# Patient Record
Sex: Male | Born: 1945 | Race: White | Hispanic: No | Marital: Married | Smoking: Never smoker
Health system: Southern US, Community
[De-identification: ages and names within clinical notes are randomized; demographics above are authoritative.]

## PROBLEM LIST (undated history)

## (undated) DIAGNOSIS — Z87442 Personal history of urinary calculi: Secondary | ICD-10-CM

## (undated) DIAGNOSIS — I639 Cerebral infarction, unspecified: Secondary | ICD-10-CM

## (undated) DIAGNOSIS — C4490 Unspecified malignant neoplasm of skin, unspecified: Secondary | ICD-10-CM

## (undated) DIAGNOSIS — M1990 Unspecified osteoarthritis, unspecified site: Secondary | ICD-10-CM

## (undated) DIAGNOSIS — I509 Heart failure, unspecified: Secondary | ICD-10-CM

## (undated) DIAGNOSIS — E785 Hyperlipidemia, unspecified: Secondary | ICD-10-CM

## (undated) DIAGNOSIS — E119 Type 2 diabetes mellitus without complications: Secondary | ICD-10-CM

## (undated) DIAGNOSIS — K219 Gastro-esophageal reflux disease without esophagitis: Secondary | ICD-10-CM

## (undated) DIAGNOSIS — G459 Transient cerebral ischemic attack, unspecified: Secondary | ICD-10-CM

## (undated) DIAGNOSIS — S32009A Unspecified fracture of unspecified lumbar vertebra, initial encounter for closed fracture: Secondary | ICD-10-CM

## (undated) DIAGNOSIS — I10 Essential (primary) hypertension: Secondary | ICD-10-CM

## (undated) DIAGNOSIS — I2510 Atherosclerotic heart disease of native coronary artery without angina pectoris: Secondary | ICD-10-CM

## (undated) DIAGNOSIS — E039 Hypothyroidism, unspecified: Secondary | ICD-10-CM

## (undated) DIAGNOSIS — Z95810 Presence of automatic (implantable) cardiac defibrillator: Secondary | ICD-10-CM

## (undated) DIAGNOSIS — I219 Acute myocardial infarction, unspecified: Secondary | ICD-10-CM

## (undated) DIAGNOSIS — I499 Cardiac arrhythmia, unspecified: Secondary | ICD-10-CM

## (undated) DIAGNOSIS — E079 Disorder of thyroid, unspecified: Secondary | ICD-10-CM

## (undated) HISTORY — DX: Type 2 diabetes mellitus without complications: E11.9

## (undated) HISTORY — DX: Heart failure, unspecified: I50.9

## (undated) HISTORY — DX: Essential (primary) hypertension: I10

## (undated) HISTORY — DX: Unspecified malignant neoplasm of skin, unspecified: C44.90

## (undated) HISTORY — DX: Gastro-esophageal reflux disease without esophagitis: K21.9

## (undated) HISTORY — DX: Hyperlipidemia, unspecified: E78.5

## (undated) HISTORY — DX: Unspecified fracture of unspecified lumbar vertebra, initial encounter for closed fracture: S32.009A

## (undated) HISTORY — DX: Disorder of thyroid, unspecified: E07.9

## (undated) HISTORY — PX: CARDIAC DEFIBRILLATOR PLACEMENT: SHX171

## (undated) HISTORY — DX: Transient cerebral ischemic attack, unspecified: G45.9

## (undated) HISTORY — DX: Acute myocardial infarction, unspecified: I21.9

## (undated) HISTORY — DX: Cerebral infarction, unspecified: I63.9

---

## 1992-05-22 HISTORY — PX: KNEE SURGERY: SHX244

## 1998-07-07 ENCOUNTER — Ambulatory Visit (HOSPITAL_COMMUNITY): Admission: RE | Admit: 1998-07-07 | Discharge: 1998-07-07 | Payer: Self-pay | Admitting: *Deleted

## 1998-10-15 ENCOUNTER — Other Ambulatory Visit: Admission: RE | Admit: 1998-10-15 | Discharge: 1998-10-15 | Payer: Self-pay | Admitting: Otolaryngology

## 1999-10-03 ENCOUNTER — Encounter (INDEPENDENT_AMBULATORY_CARE_PROVIDER_SITE_OTHER): Payer: Self-pay

## 1999-10-03 ENCOUNTER — Ambulatory Visit (HOSPITAL_COMMUNITY): Admission: RE | Admit: 1999-10-03 | Discharge: 1999-10-03 | Payer: Self-pay | Admitting: *Deleted

## 2000-11-09 ENCOUNTER — Encounter (INDEPENDENT_AMBULATORY_CARE_PROVIDER_SITE_OTHER): Payer: Self-pay | Admitting: Specialist

## 2000-11-09 ENCOUNTER — Ambulatory Visit (HOSPITAL_COMMUNITY): Admission: RE | Admit: 2000-11-09 | Discharge: 2000-11-09 | Payer: Self-pay | Admitting: *Deleted

## 2000-11-09 ENCOUNTER — Encounter: Payer: Self-pay | Admitting: *Deleted

## 2002-01-27 ENCOUNTER — Ambulatory Visit (HOSPITAL_COMMUNITY): Admission: RE | Admit: 2002-01-27 | Discharge: 2002-01-27 | Payer: Self-pay | Admitting: *Deleted

## 2002-01-27 ENCOUNTER — Encounter (INDEPENDENT_AMBULATORY_CARE_PROVIDER_SITE_OTHER): Payer: Self-pay

## 2003-08-26 ENCOUNTER — Inpatient Hospital Stay (HOSPITAL_COMMUNITY): Admission: EM | Admit: 2003-08-26 | Discharge: 2003-08-27 | Payer: Self-pay | Admitting: Emergency Medicine

## 2004-03-16 ENCOUNTER — Ambulatory Visit (HOSPITAL_COMMUNITY): Admission: RE | Admit: 2004-03-16 | Discharge: 2004-03-16 | Payer: Self-pay | Admitting: *Deleted

## 2004-03-16 ENCOUNTER — Encounter (INDEPENDENT_AMBULATORY_CARE_PROVIDER_SITE_OTHER): Payer: Self-pay | Admitting: *Deleted

## 2005-10-25 DIAGNOSIS — I639 Cerebral infarction, unspecified: Secondary | ICD-10-CM | POA: Insufficient documentation

## 2006-03-16 ENCOUNTER — Ambulatory Visit (HOSPITAL_COMMUNITY): Admission: RE | Admit: 2006-03-16 | Discharge: 2006-03-16 | Payer: Self-pay | Admitting: *Deleted

## 2006-03-16 ENCOUNTER — Encounter (INDEPENDENT_AMBULATORY_CARE_PROVIDER_SITE_OTHER): Payer: Self-pay | Admitting: Specialist

## 2007-05-23 DIAGNOSIS — G459 Transient cerebral ischemic attack, unspecified: Secondary | ICD-10-CM

## 2007-05-23 HISTORY — DX: Transient cerebral ischemic attack, unspecified: G45.9

## 2008-07-16 ENCOUNTER — Ambulatory Visit (HOSPITAL_COMMUNITY): Admission: RE | Admit: 2008-07-16 | Discharge: 2008-07-16 | Payer: Self-pay | Admitting: *Deleted

## 2008-07-16 ENCOUNTER — Encounter (INDEPENDENT_AMBULATORY_CARE_PROVIDER_SITE_OTHER): Payer: Self-pay | Admitting: *Deleted

## 2009-05-22 DIAGNOSIS — G458 Other transient cerebral ischemic attacks and related syndromes: Secondary | ICD-10-CM | POA: Insufficient documentation

## 2010-05-22 DIAGNOSIS — S32009A Unspecified fracture of unspecified lumbar vertebra, initial encounter for closed fracture: Secondary | ICD-10-CM

## 2010-05-22 HISTORY — DX: Unspecified fracture of unspecified lumbar vertebra, initial encounter for closed fracture: S32.009A

## 2010-08-31 ENCOUNTER — Encounter: Payer: Self-pay | Admitting: Physician Assistant

## 2010-08-31 DIAGNOSIS — L570 Actinic keratosis: Secondary | ICD-10-CM

## 2010-08-31 DIAGNOSIS — C439 Malignant melanoma of skin, unspecified: Secondary | ICD-10-CM

## 2010-08-31 DIAGNOSIS — M5136 Other intervertebral disc degeneration, lumbar region: Secondary | ICD-10-CM | POA: Insufficient documentation

## 2010-08-31 DIAGNOSIS — I739 Peripheral vascular disease, unspecified: Secondary | ICD-10-CM | POA: Insufficient documentation

## 2010-08-31 DIAGNOSIS — I10 Essential (primary) hypertension: Secondary | ICD-10-CM

## 2010-08-31 DIAGNOSIS — M545 Low back pain, unspecified: Secondary | ICD-10-CM | POA: Insufficient documentation

## 2010-08-31 DIAGNOSIS — I2510 Atherosclerotic heart disease of native coronary artery without angina pectoris: Secondary | ICD-10-CM | POA: Insufficient documentation

## 2010-08-31 DIAGNOSIS — G8929 Other chronic pain: Secondary | ICD-10-CM

## 2010-09-02 ENCOUNTER — Encounter: Payer: Self-pay | Admitting: Physician Assistant

## 2010-09-02 DIAGNOSIS — I639 Cerebral infarction, unspecified: Secondary | ICD-10-CM

## 2010-09-02 DIAGNOSIS — K2270 Barrett's esophagus without dysplasia: Secondary | ICD-10-CM | POA: Insufficient documentation

## 2010-09-02 DIAGNOSIS — I6523 Occlusion and stenosis of bilateral carotid arteries: Secondary | ICD-10-CM | POA: Insufficient documentation

## 2010-09-02 DIAGNOSIS — I2510 Atherosclerotic heart disease of native coronary artery without angina pectoris: Secondary | ICD-10-CM

## 2010-09-02 DIAGNOSIS — K449 Diaphragmatic hernia without obstruction or gangrene: Secondary | ICD-10-CM | POA: Insufficient documentation

## 2010-09-02 NOTE — Assessment & Plan Note (Deleted)
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 PCI to the proximal LAD (2004) B. Ischemic cardiomyopathy  EF 35% C. S/P Metronic AICD implantation

## 2010-10-04 NOTE — Op Note (Signed)
NAME:  Edward Heath, ARAVE NO.:  [id number]   MEDICAL RECORD NO.:  [id number]          PATIENT TYPE:  AMB   LOCATION:  ENDO                         FACILITY:  [facility]   PHYSICIAN:  Georgiana Spinner, M.D.    DATE OF BIRTH:  04-26-1946   DATE OF PROCEDURE:  DATE OF DISCHARGE:                               OPERATIVE REPORT   PROCEDURE:  Colonoscopy.   INDICATIONS:  Colon cancer screening, rectal bleeding.   ANESTHESIA:  None further given.   PROCEDURE:  With the patient mildly sedated in the left lateral  decubitus position the Pentax videoscopic colonoscope was inserted in  the rectum and passed under direct vision rather easily to the cecum  identified by ileocecal valve and appendiceal orifice, both of which  were photographed.  From this point colonoscope was slowly withdrawn  taking circumferential views of colonic mucosa, stopping in the  transverse colon where a polyp was seen, photographed and removed using  biopsy forceps.  It took [place] two bites to remove it.  Subsequently, the  endoscope was withdrawn to the descending colon where a second polyp was  seen.  It too was removed at this time using snare cautery technique  without cautery since the patient has had been ICD.  The endoscope was  withdrawn to the rectum which appeared normal on direct and showed  hemorrhoids on retroflexed view.  The endoscope was straightened and  withdrawn.  The patient's vital signs and pulse oximeter remained  stable.  The patient tolerated procedure well with no apparent  complications.   FINDINGS:  Two small polyps in transverse and descending colon.  Internal hemorrhoids, otherwise unremarkable exam.   PLAN:  Await biopsy report.  The patient will call me for results and  follow-up with me as an outpatient.           ______________________________  Georgiana Spinner, M.D.     GMO/MEDQ  D:  07/16/2008  T:  07/16/2008  Job:  [id number]

## 2010-10-04 NOTE — Op Note (Signed)
NAME:  Edward Heath, Edward Heath NO.:  [id number]   MEDICAL RECORD NO.:  [id number]          PATIENT TYPE:  AMB   LOCATION:  ENDO                         FACILITY:  [facility]   PHYSICIAN:  Georgiana Spinner, M.D.    DATE OF BIRTH:  1945-10-27   DATE OF PROCEDURE:  DATE OF DISCHARGE:                               OPERATIVE REPORT   PROCEDURE:  Upper endoscopy.   INDICATIONS:  GERD.   ANESTHESIA:  Fentanyl 75 mcg, Versed 7.5 mg.   PROCEDURE:  With the patient mildly sedated in the left lateral  decubitus position, the Pentax videoscopic endoscope was inserted in the  mouth and passed under direct vision through the esophagus, which  appeared normal, into the stomach.  The fundus, body, antrum, duodenal  bulb and second portion of the duodenum were visualized.  From this  point the endoscope was slowly withdrawn, taking circumferential views  of the duodenal mucosa until the endoscope had been pulled back into the  stomach, placed in retroflexion to view the stomach from below.  The  endoscope was then straightened after a biopsy of possible Barrett's was  taken seen on retroflexed view only.  The endoscope then was withdrawn,  taking circumferential views of the remaining gastric and esophageal  mucosa.  The patient's vital signs, pulse oximeter remained stable.  The  patient tolerated the procedure well without apparent complications.   FINDINGS:  Question of Barrett esophagus seen on retroflexed view.  Otherwise an unremarkable examination.   PLAN:  Await biopsy report.  The patient will call me for results and  follow up with me as an outpatient.  Proceed to colonoscopy.           ______________________________  Georgiana Spinner, M.D.     GMO/MEDQ  D:  07/16/2008  T:  07/16/2008  Job:  [id number]

## 2010-10-07 NOTE — Procedures (Signed)
[facility]  Patient:    Edward Heath, Edward Heath                      MRN: [id number] Proc. Date: 11/09/00 Adm. Date:  [id number] Attending:  Sabino Gasser CC:         Delorse Lek, M.D.                           Procedure Report  PROCEDURE:  Upper endoscopy with dilation and biopsy.  INDICATIONS:  Solid food dysphagia.  Patient on Nexium.  ANESTHESIA:  Demerol 70 mg, Versed 7 mg.  DESCRIPTION OF PROCEDURE:  With the patient mildly sedated in room 2 of radiology at [facility], the Olympus videoscopic endoscope was inserted into the mouth and passed under direct vision through the esophagus. The distal esophagus was approached and seemed to show areas of Barretts esophagus.  These were photographed.  We entered into the stomach.  The fundus, body, antrum, duodenal bulb, and second portion of the duodenum all appeared normal.  From this point, the endoscope was slowly withdrawn, taking circumferential views of the entire duodenal mucosa until the endoscope then pulled back in the stomach and placed in retroflexion to view the stomach from below.  This was photographed.  The endoscope was then straightened, guidewire was passed; the endoscope was withdrawn taking circumferential views of the remaining gastric and esophageal mucosa.  Subsequently, Savary dilators, size 14, 17, and 19 mm were passed with minimal blood seen and with very minimal resistance.  With the latter, the guidewire was removed.  The endoscope was then reinserted to the level of the distal esophagus where biopsies were taken.  The endoscope was then withdrawn, taking circumferential views of the remaining esophageal mucosa which otherwise appeared normal.  The patients vital signs and pulse oximeter remained stable.  The patient tolerated the procedure well without apparent complications.  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  Biopsies  taken.  Dilation from 14, 17, and 19 Savary dilator.  PLAN:  We will have the patient call me for the results of the biopsy and follow up with me as an outpatient to assess clinical response. DD:  11/09/00 TD:  11/09/00 Job: [id number]

## 2010-10-07 NOTE — Op Note (Signed)
   TNAMECORKY, BLUMSTEIN                        ACCOUNT NO.:  [id number]   MEDICAL RECORD NO.:  [id number]                   PATIENT TYPE:  AMB   LOCATION:  ENDO                                 FACILITY:  [facility]   PHYSICIAN:  Georgiana Spinner, M.D.                 DATE OF BIRTH:  [birth year]   DATE OF PROCEDURE:  DATE OF DISCHARGE:                                 OPERATIVE REPORT   PROCEDURE:  Colonoscopy.   INDICATIONS FOR PROCEDURE:  Colon cancer screening, Hemoccult positivity.   ANESTHESIA:  Demerol 10, Versed 1 mg.   DESCRIPTION OF PROCEDURE:  With the patient mildly sedated in the left  lateral decubitus position, a rectal exam was performed which revealed trace  positive material. Subsequently, the Olympus videoscopic colonoscope was  inserted in the rectum and passed under direct vision to the cecum  identified by the ileocecal valve and appendiceal orifice both of which were  photographed. From this point, the colonoscope was slowly withdrawn taking  circumferential views of the entire colonic mucosa stopping only in the  rectum which appeared normal on direct and showed hemorrhoids on retroflexed  view. The endoscope was straightened and withdrawn. The patient's vital  signs and pulse oximeter remained stable. The patient tolerated the  procedure well without apparent complications.   FINDINGS:  Internal hemorrhoids, otherwise, unremarkable colonoscopic  examination.   PLAN:  Repeat examination in approximately 10 years.                                                Georgiana Spinner, M.D.    GMO/MEDQ  D:  01/27/2002  T:  01/27/2002  Job:  [id number]   cc:   Delorse Lek, M.D.

## 2010-10-07 NOTE — Discharge Summary (Signed)
NAME:  Edward Heath, Edward Heath                         ACCOUNT NO.:  [id number]   MEDICAL RECORD NO.:  [id number]                   PATIENT TYPE:  INP   LOCATION:  0353                                 FACILITY:  [facility]   PHYSICIAN:  Vesta Mixer, M.D.              DATE OF BIRTH:  Apr 29, 1946   DATE OF ADMISSION:  08/26/2003  DATE OF DISCHARGE:  08/27/2003                                 DISCHARGE SUMMARY   DISCHARGE DIAGNOSES:  1. Non-cardiac chest pain.  2. History of coronary artery disease with a history of an anterior wall     myocardial infarction approximately five months ago.  3. Hyperlipidemia.  4. Hypertension.   DISCHARGE MEDICATIONS:  1. Cytotec 200 mcg b.i.d.  2. Diclofenac 75 mg daily.  3. Protonix 40 mg daily.  4. Simvastatin 80 mg daily.  5. Hydrochlorothiazide 25 mg daily.  6. Lisinopril 10 mg daily.  7. Metoprolol 25 mg p.o. b.i.d.  8. Terazosin 5 mg daily.   DISPOSITION/INSTRUCTIONS:  1. The patient is to see Dr. Deloris Ping Nahser in one to two weeks.  2. He is to be on a low-fat, low-cholesterol diet.  3. He is to follow up with [facility] as needed.   HISTORY OF PRESENT ILLNESS:  The patient is a middle-aged gentleman who was  seen yesterday for some episodes of atypical chest pain.  He was admitted  for further observation.   HOSPITAL COURSE:  #1 - CHEST PAIN:  The patient ruled out for a myocardial  infarction with serial CPK's.  His electrocardiogram remained unchanged.  He  stated that his chest pain was completely different than his anterior wall  myocardial infarction chest pain that he had several months ago.  The  patient did quite well and did not have any further episodes of pain.  He  will need to be evaluated for further problems, including a gastroesophageal  etiology or musculoskeletal etiology.  He will see Dr. Elease Hashimoto next week and  we will get a stress Cardiolite study.  We will also need to get an  echocardiogram for  further evaluation of his LV function following his large  anterior wall myocardial infarction back in November.  All of his other  medical problems are stable.                                               Vesta Mixer, M.D.   PJN/MEDQ  D:  08/27/2003  T:  08/28/2003  Job:  [id number]   cc:   [facility]

## 2010-10-07 NOTE — Op Note (Signed)
NAME:  Edward Heath, Edward Heath NO.:  [id number]   MEDICAL RECORD NO.:  [id number]          PATIENT TYPE:  AMB   LOCATION:  ENDO                         FACILITY:  [facility]   PHYSICIAN:  Georgiana Spinner, M.D.    DATE OF BIRTH:  July 15, 1945   DATE OF PROCEDURE:  DATE OF DISCHARGE:                                 OPERATIVE REPORT   PROCEDURE:  Upper endoscopy with biopsy.   INDICATIONS:  GERD, with history of Barrett's esophagus.   ANESTHESIA:  1.  Demerol 50.  2.  Versed 4 mg.   PROCEDURE:  With the patient mildly sedated in the left lateral decubitus  position, the Olympus videoscopic endoscope was inserted into the mouth and  passed under direct vision into the esophagus which appeared normal until we  reached the distal esophagus.  Then, there were changes of Barrett's  esophagus which were biopsied and photographed.  The endoscope was advanced  into the stomach, and the fundus appeared to be somewhat cobblestoned, which  was photographed and biopsied.  Body, antrum, duodenal bulb, and second  portion of duodenum appeared normal.  From this point, the endoscope was  slowly withdrawn, taking circumferential views of the remaining gastric and  duodenal mucosa until the endoscope until the endoscope had been pulled back  into the stomach, placed on retroflexion, and viewed the stomach from below.  The endoscope was straightened and withdrawn, taking circumferential views  of the remaining gastric and esophageal mucosa.  The patient's vital signs  and pulse oximetry remained.  The patient tolerated the procedure well,  without apparent complication.   FINDINGS:  1.  Barrett's esophagus, biopsied.  Await biopsy report.  2.  Changes of fundus, biopsied.  Await biopsy report.   The patient will call me for results and follow up with me as an outpatient.      GMO/MEDQ  D:  03/16/2004  T:  03/16/2004  Job:  [id number]

## 2010-10-07 NOTE — Op Note (Signed)
   Edward Heath, [name]                        ACCOUNT NO.:  [id number]   MEDICAL RECORD NO.:  [id number]                   PATIENT TYPE:  AMB   LOCATION:  ENDO                                 FACILITY:  [facility]   PHYSICIAN:  Georgiana Spinner, M.D.                 DATE OF BIRTH:  Mar 31, 1946   DATE OF PROCEDURE:  DATE OF DISCHARGE:                                 OPERATIVE REPORT   PROCEDURE:  Upper endoscopy with biopsy.   INDICATIONS FOR PROCEDURE:  Barrett's esophagus, gastroesophageal reflux  disease.   ANESTHESIA:  Demerol 50, Versed 5 mg.   DESCRIPTION OF PROCEDURE:  With the patient mildly sedated in the left  lateral decubitus position, the Olympus videoscopic endoscope was inserted  in the mouth and passed under direct vision through the esophagus which  appeared normal until we reached the distal esophagus which showed some  irregularity of the Z line which was photographed and then biopsied. We  entered into the stomach. The fundus and body appeared normal. The antrum  showed some changes possibly of gastritis which were photographed biopsied.  We entered into the duodenal bulb and second portion of the duodenum and  both appeared normal. From this point, the endoscope was slowly withdrawn  taking circumferential views of the entire duodenal mucosa until the  endoscope was then pulled back in the stomach, placed in retroflexion to  view the stomach from below and an incomplete wrap of the GE junction around  the endoscope was noted and photographed. The endoscope was then  straightened and withdrawn taking circumferential views of the remaining  gastric and esophageal mucosa. The patient's vital signs and pulse oximeter  remained stable. The patient tolerated the procedure well without apparent  complications.   FINDINGS:  Irregularity at the Z line biopsied, incomplete wrap of the GE  junction around the endoscope and some possible antritis biopsied. Await  biopsy  report. The patient will call me for the results and followup with me  as an outpatient. Proceed to colonoscopy as planned.                                                Georgiana Spinner, M.D.    GMO/MEDQ  D:  01/27/2002  T:  01/27/2002  Job:  [id number]   cc:   Delorse Lek, M.D.

## 2010-10-07 NOTE — Procedures (Signed)
[facility]  Patient:    Edward Heath, Edward Heath                      MRN: [id number] Adm. Date:  [id number] Attending:  Sabino Gasser                           Procedure Report  PROCEDURE:  Follow-up of Barretts esophagus.  ANESTHESIA:  Demerol 50 mg, Versed 3 mg was given intravenously in divided dose.  DESCRIPTION OF PROCEDURE:  With the patient mildly sedately in the left lateral decubitus position, the Olympus videoscopic endoscope was inserted in the mouth and passed under direct vision through the esophagus and there were changes of possible Barretts esophagus photographed and then biopsied. We entered into the stomach. As we approached the antrum, it appeared to be somewhat erythematous and biopsies were taken of this to rule out Helicobacter pylori infestation. Once accomplished, the endoscope was advanced to the duodenal bulb and second portion of duodenum both of which appeared normal and were photographed. From this point, the endoscope was slowly withdrawn taking circumferential views of the entire duodenal mucosa until the endoscope was then pulled back into the stomach, placed in retroflexion to view the stomach from below and this appeared normal and was photographed. The endoscope was then straightened and pulled back from distal to proximal stomach taking circumferential views of the remaining gastric and esophageal mucosa all of which otherwise appeared normal. The patients vital signs and pulse oximeter remained stable. The patient tolerated the procedure well without apparent complications.  FINDINGS:  Barretts esophagus biopsied. Await biopsy report. Erythema of distal stomach. Await biopsy report. The patient will call me for results and follow-up with me as an outpatient. DD:  10/03/99 TD:  10/04/99 Job: [id number]

## 2010-10-07 NOTE — Op Note (Signed)
NAME:  Edward Heath, Edward Heath NO.:  [id number]   MEDICAL RECORD NO.:  [id number]          PATIENT TYPE:  AMB   LOCATION:  ENDO                         FACILITY:  [facility]   PHYSICIAN:  Georgiana Spinner, M.D.    DATE OF BIRTH:  [birth year]   DATE OF PROCEDURE:  03/16/2006  DATE OF DISCHARGE:                                 OPERATIVE REPORT   PROCEDURE:  Upper endoscopy.   INDICATIONS:  Gastroesophageal reflux disease.   ANESTHESIA:  Fentanyl 75 mcg, Versed 5 mg.   DESCRIPTION OF PROCEDURE:  With the patient mildly sedated in the left  lateral decubitus position, the Olympus videoscopic endoscope was inserted  in the mouth and passed under direct vision through the esophagus which  appeared normal until we reached the distal esophagus where there were  changes of Barrett's, photographed and biopsied.  We entered into the  stomach, the fundus, body, antrum, duodenal bulb, and second portion of the  duodenum were visualized. From this point, the endoscope was slowly  withdrawn taking circumferential views of duodenal mucosa until the  endoscope had been pulled back in the stomach and placed in retroflexion to  view the stomach from below. The endoscope was straightened and withdrawn  taking circumferential views of the remaining gastric and esophageal mucosa.  The patient's vital signs and pulse oximeter remained stable.  The patient  tolerated the procedure well without apparent complications.   FINDINGS:  Question of Barrett's esophagus biopsied.  Await biopsy report.  The patient will call me for results and follow-up with me as an outpatient.           ______________________________  Georgiana Spinner, M.D.     GMO/MEDQ  D:  03/16/2006  T:  03/17/2006  Job:  [id number]

## 2011-04-20 DIAGNOSIS — S22059A Unspecified fracture of T5-T6 vertebra, initial encounter for closed fracture: Secondary | ICD-10-CM | POA: Insufficient documentation

## 2011-04-20 DIAGNOSIS — M4320 Fusion of spine, site unspecified: Secondary | ICD-10-CM | POA: Insufficient documentation

## 2011-05-31 ENCOUNTER — Ambulatory Visit: Payer: Medicare Other | Attending: Family Medicine | Admitting: Physical Therapy

## 2011-05-31 DIAGNOSIS — R293 Abnormal posture: Secondary | ICD-10-CM | POA: Diagnosis not present

## 2011-05-31 DIAGNOSIS — M545 Low back pain, unspecified: Secondary | ICD-10-CM | POA: Diagnosis not present

## 2011-05-31 DIAGNOSIS — M546 Pain in thoracic spine: Secondary | ICD-10-CM | POA: Insufficient documentation

## 2011-05-31 DIAGNOSIS — R5381 Other malaise: Secondary | ICD-10-CM | POA: Insufficient documentation

## 2011-05-31 DIAGNOSIS — IMO0001 Reserved for inherently not codable concepts without codable children: Secondary | ICD-10-CM | POA: Insufficient documentation

## 2011-06-01 ENCOUNTER — Ambulatory Visit: Payer: Medicare Other | Admitting: Physical Therapy

## 2011-06-01 DIAGNOSIS — M545 Low back pain, unspecified: Secondary | ICD-10-CM | POA: Diagnosis not present

## 2011-06-01 DIAGNOSIS — R293 Abnormal posture: Secondary | ICD-10-CM | POA: Diagnosis not present

## 2011-06-01 DIAGNOSIS — IMO0001 Reserved for inherently not codable concepts without codable children: Secondary | ICD-10-CM | POA: Diagnosis not present

## 2011-06-01 DIAGNOSIS — M546 Pain in thoracic spine: Secondary | ICD-10-CM | POA: Diagnosis not present

## 2011-06-01 DIAGNOSIS — R5381 Other malaise: Secondary | ICD-10-CM | POA: Diagnosis not present

## 2011-06-05 ENCOUNTER — Ambulatory Visit: Payer: Medicare Other | Admitting: Physical Therapy

## 2011-06-05 DIAGNOSIS — M546 Pain in thoracic spine: Secondary | ICD-10-CM | POA: Diagnosis not present

## 2011-06-05 DIAGNOSIS — M545 Low back pain, unspecified: Secondary | ICD-10-CM | POA: Diagnosis not present

## 2011-06-05 DIAGNOSIS — R293 Abnormal posture: Secondary | ICD-10-CM | POA: Diagnosis not present

## 2011-06-05 DIAGNOSIS — IMO0001 Reserved for inherently not codable concepts without codable children: Secondary | ICD-10-CM | POA: Diagnosis not present

## 2011-06-05 DIAGNOSIS — R5381 Other malaise: Secondary | ICD-10-CM | POA: Diagnosis not present

## 2011-06-07 ENCOUNTER — Ambulatory Visit: Payer: Medicare Other | Admitting: Physical Therapy

## 2011-06-07 DIAGNOSIS — IMO0001 Reserved for inherently not codable concepts without codable children: Secondary | ICD-10-CM | POA: Diagnosis not present

## 2011-06-07 DIAGNOSIS — M545 Low back pain, unspecified: Secondary | ICD-10-CM | POA: Diagnosis not present

## 2011-06-07 DIAGNOSIS — M546 Pain in thoracic spine: Secondary | ICD-10-CM | POA: Diagnosis not present

## 2011-06-07 DIAGNOSIS — R5381 Other malaise: Secondary | ICD-10-CM | POA: Diagnosis not present

## 2011-06-07 DIAGNOSIS — R293 Abnormal posture: Secondary | ICD-10-CM | POA: Diagnosis not present

## 2011-06-08 ENCOUNTER — Ambulatory Visit: Payer: Medicare Other | Admitting: Physical Therapy

## 2011-06-08 DIAGNOSIS — R293 Abnormal posture: Secondary | ICD-10-CM | POA: Diagnosis not present

## 2011-06-08 DIAGNOSIS — IMO0001 Reserved for inherently not codable concepts without codable children: Secondary | ICD-10-CM | POA: Diagnosis not present

## 2011-06-08 DIAGNOSIS — R5381 Other malaise: Secondary | ICD-10-CM | POA: Diagnosis not present

## 2011-06-08 DIAGNOSIS — M546 Pain in thoracic spine: Secondary | ICD-10-CM | POA: Diagnosis not present

## 2011-06-08 DIAGNOSIS — M545 Low back pain, unspecified: Secondary | ICD-10-CM | POA: Diagnosis not present

## 2011-06-12 ENCOUNTER — Ambulatory Visit: Payer: Medicare Other | Admitting: Physical Therapy

## 2011-06-12 DIAGNOSIS — IMO0001 Reserved for inherently not codable concepts without codable children: Secondary | ICD-10-CM | POA: Diagnosis not present

## 2011-06-12 DIAGNOSIS — R293 Abnormal posture: Secondary | ICD-10-CM | POA: Diagnosis not present

## 2011-06-12 DIAGNOSIS — M546 Pain in thoracic spine: Secondary | ICD-10-CM | POA: Diagnosis not present

## 2011-06-12 DIAGNOSIS — M545 Low back pain, unspecified: Secondary | ICD-10-CM | POA: Diagnosis not present

## 2011-06-12 DIAGNOSIS — R5381 Other malaise: Secondary | ICD-10-CM | POA: Diagnosis not present

## 2011-06-13 DIAGNOSIS — I428 Other cardiomyopathies: Secondary | ICD-10-CM | POA: Diagnosis not present

## 2011-06-13 DIAGNOSIS — I447 Left bundle-branch block, unspecified: Secondary | ICD-10-CM | POA: Diagnosis not present

## 2011-06-13 DIAGNOSIS — I2589 Other forms of chronic ischemic heart disease: Secondary | ICD-10-CM | POA: Diagnosis not present

## 2011-06-14 ENCOUNTER — Ambulatory Visit: Payer: Medicare Other | Admitting: Physical Therapy

## 2011-06-14 DIAGNOSIS — R5381 Other malaise: Secondary | ICD-10-CM | POA: Diagnosis not present

## 2011-06-14 DIAGNOSIS — IMO0001 Reserved for inherently not codable concepts without codable children: Secondary | ICD-10-CM | POA: Diagnosis not present

## 2011-06-14 DIAGNOSIS — R293 Abnormal posture: Secondary | ICD-10-CM | POA: Diagnosis not present

## 2011-06-14 DIAGNOSIS — M545 Low back pain, unspecified: Secondary | ICD-10-CM | POA: Diagnosis not present

## 2011-06-14 DIAGNOSIS — M546 Pain in thoracic spine: Secondary | ICD-10-CM | POA: Diagnosis not present

## 2011-06-15 ENCOUNTER — Ambulatory Visit: Payer: Medicare Other | Admitting: Physical Therapy

## 2011-06-15 DIAGNOSIS — R5381 Other malaise: Secondary | ICD-10-CM | POA: Diagnosis not present

## 2011-06-15 DIAGNOSIS — M546 Pain in thoracic spine: Secondary | ICD-10-CM | POA: Diagnosis not present

## 2011-06-15 DIAGNOSIS — R293 Abnormal posture: Secondary | ICD-10-CM | POA: Diagnosis not present

## 2011-06-15 DIAGNOSIS — IMO0001 Reserved for inherently not codable concepts without codable children: Secondary | ICD-10-CM | POA: Diagnosis not present

## 2011-06-15 DIAGNOSIS — M545 Low back pain, unspecified: Secondary | ICD-10-CM | POA: Diagnosis not present

## 2011-06-19 ENCOUNTER — Ambulatory Visit: Payer: Medicare Other | Admitting: Physical Therapy

## 2011-06-19 DIAGNOSIS — R5381 Other malaise: Secondary | ICD-10-CM | POA: Diagnosis not present

## 2011-06-19 DIAGNOSIS — M546 Pain in thoracic spine: Secondary | ICD-10-CM | POA: Diagnosis not present

## 2011-06-19 DIAGNOSIS — R293 Abnormal posture: Secondary | ICD-10-CM | POA: Diagnosis not present

## 2011-06-19 DIAGNOSIS — M545 Low back pain, unspecified: Secondary | ICD-10-CM | POA: Diagnosis not present

## 2011-06-19 DIAGNOSIS — IMO0001 Reserved for inherently not codable concepts without codable children: Secondary | ICD-10-CM | POA: Diagnosis not present

## 2011-06-21 ENCOUNTER — Ambulatory Visit: Payer: Medicare Other | Admitting: Physical Therapy

## 2011-06-21 DIAGNOSIS — M545 Low back pain, unspecified: Secondary | ICD-10-CM | POA: Diagnosis not present

## 2011-06-21 DIAGNOSIS — R5381 Other malaise: Secondary | ICD-10-CM | POA: Diagnosis not present

## 2011-06-21 DIAGNOSIS — IMO0001 Reserved for inherently not codable concepts without codable children: Secondary | ICD-10-CM | POA: Diagnosis not present

## 2011-06-21 DIAGNOSIS — R293 Abnormal posture: Secondary | ICD-10-CM | POA: Diagnosis not present

## 2011-06-21 DIAGNOSIS — M546 Pain in thoracic spine: Secondary | ICD-10-CM | POA: Diagnosis not present

## 2011-06-22 ENCOUNTER — Ambulatory Visit: Payer: Medicare Other | Admitting: Physical Therapy

## 2011-06-22 DIAGNOSIS — M545 Low back pain, unspecified: Secondary | ICD-10-CM | POA: Diagnosis not present

## 2011-06-22 DIAGNOSIS — M546 Pain in thoracic spine: Secondary | ICD-10-CM | POA: Diagnosis not present

## 2011-06-22 DIAGNOSIS — R293 Abnormal posture: Secondary | ICD-10-CM | POA: Diagnosis not present

## 2011-06-22 DIAGNOSIS — R5381 Other malaise: Secondary | ICD-10-CM | POA: Diagnosis not present

## 2011-06-22 DIAGNOSIS — IMO0001 Reserved for inherently not codable concepts without codable children: Secondary | ICD-10-CM | POA: Diagnosis not present

## 2011-08-02 DIAGNOSIS — J019 Acute sinusitis, unspecified: Secondary | ICD-10-CM | POA: Diagnosis not present

## 2011-08-14 DIAGNOSIS — I10 Essential (primary) hypertension: Secondary | ICD-10-CM | POA: Diagnosis not present

## 2011-10-02 DIAGNOSIS — R319 Hematuria, unspecified: Secondary | ICD-10-CM | POA: Diagnosis not present

## 2011-10-12 DIAGNOSIS — N200 Calculus of kidney: Secondary | ICD-10-CM | POA: Insufficient documentation

## 2011-10-12 DIAGNOSIS — Z125 Encounter for screening for malignant neoplasm of prostate: Secondary | ICD-10-CM | POA: Insufficient documentation

## 2011-10-12 DIAGNOSIS — R319 Hematuria, unspecified: Secondary | ICD-10-CM | POA: Insufficient documentation

## 2011-10-19 DIAGNOSIS — Z7982 Long term (current) use of aspirin: Secondary | ICD-10-CM | POA: Diagnosis not present

## 2011-10-19 DIAGNOSIS — N200 Calculus of kidney: Secondary | ICD-10-CM | POA: Diagnosis not present

## 2011-10-19 DIAGNOSIS — Z125 Encounter for screening for malignant neoplasm of prostate: Secondary | ICD-10-CM | POA: Diagnosis not present

## 2011-10-19 DIAGNOSIS — R319 Hematuria, unspecified: Secondary | ICD-10-CM | POA: Diagnosis not present

## 2011-10-19 DIAGNOSIS — K5730 Diverticulosis of large intestine without perforation or abscess without bleeding: Secondary | ICD-10-CM | POA: Diagnosis not present

## 2011-10-19 DIAGNOSIS — Z79899 Other long term (current) drug therapy: Secondary | ICD-10-CM | POA: Diagnosis not present

## 2011-10-19 DIAGNOSIS — F17200 Nicotine dependence, unspecified, uncomplicated: Secondary | ICD-10-CM | POA: Diagnosis not present

## 2011-11-13 DIAGNOSIS — Z85828 Personal history of other malignant neoplasm of skin: Secondary | ICD-10-CM | POA: Diagnosis not present

## 2011-11-13 DIAGNOSIS — L821 Other seborrheic keratosis: Secondary | ICD-10-CM | POA: Diagnosis not present

## 2011-11-13 DIAGNOSIS — L909 Atrophic disorder of skin, unspecified: Secondary | ICD-10-CM | POA: Diagnosis not present

## 2011-11-13 DIAGNOSIS — L919 Hypertrophic disorder of the skin, unspecified: Secondary | ICD-10-CM | POA: Diagnosis not present

## 2011-12-18 DIAGNOSIS — M25559 Pain in unspecified hip: Secondary | ICD-10-CM | POA: Diagnosis not present

## 2011-12-19 DIAGNOSIS — I2589 Other forms of chronic ischemic heart disease: Secondary | ICD-10-CM | POA: Diagnosis not present

## 2012-01-09 DIAGNOSIS — IMO0002 Reserved for concepts with insufficient information to code with codable children: Secondary | ICD-10-CM | POA: Diagnosis not present

## 2012-01-10 DIAGNOSIS — I2589 Other forms of chronic ischemic heart disease: Secondary | ICD-10-CM | POA: Diagnosis not present

## 2012-03-19 DIAGNOSIS — I2589 Other forms of chronic ischemic heart disease: Secondary | ICD-10-CM | POA: Diagnosis not present

## 2012-03-25 DIAGNOSIS — Z09 Encounter for follow-up examination after completed treatment for conditions other than malignant neoplasm: Secondary | ICD-10-CM | POA: Diagnosis not present

## 2012-03-25 DIAGNOSIS — I509 Heart failure, unspecified: Secondary | ICD-10-CM | POA: Diagnosis not present

## 2012-03-25 DIAGNOSIS — I10 Essential (primary) hypertension: Secondary | ICD-10-CM | POA: Diagnosis not present

## 2012-03-29 DIAGNOSIS — M79609 Pain in unspecified limb: Secondary | ICD-10-CM | POA: Diagnosis not present

## 2012-04-15 DIAGNOSIS — I2589 Other forms of chronic ischemic heart disease: Secondary | ICD-10-CM | POA: Diagnosis not present

## 2012-04-22 DIAGNOSIS — I10 Essential (primary) hypertension: Secondary | ICD-10-CM | POA: Diagnosis not present

## 2012-04-22 DIAGNOSIS — I6350 Cerebral infarction due to unspecified occlusion or stenosis of unspecified cerebral artery: Secondary | ICD-10-CM | POA: Diagnosis not present

## 2012-04-22 DIAGNOSIS — E785 Hyperlipidemia, unspecified: Secondary | ICD-10-CM | POA: Diagnosis not present

## 2012-04-22 DIAGNOSIS — E119 Type 2 diabetes mellitus without complications: Secondary | ICD-10-CM | POA: Diagnosis not present

## 2012-04-22 DIAGNOSIS — G459 Transient cerebral ischemic attack, unspecified: Secondary | ICD-10-CM | POA: Diagnosis not present

## 2012-04-22 DIAGNOSIS — Z8679 Personal history of other diseases of the circulatory system: Secondary | ICD-10-CM | POA: Diagnosis not present

## 2012-05-07 DIAGNOSIS — M5137 Other intervertebral disc degeneration, lumbosacral region: Secondary | ICD-10-CM | POA: Diagnosis not present

## 2012-05-07 DIAGNOSIS — K4090 Unilateral inguinal hernia, without obstruction or gangrene, not specified as recurrent: Secondary | ICD-10-CM | POA: Diagnosis not present

## 2012-05-07 DIAGNOSIS — M25559 Pain in unspecified hip: Secondary | ICD-10-CM | POA: Diagnosis not present

## 2012-05-07 DIAGNOSIS — I6529 Occlusion and stenosis of unspecified carotid artery: Secondary | ICD-10-CM | POA: Diagnosis not present

## 2012-05-07 DIAGNOSIS — I6350 Cerebral infarction due to unspecified occlusion or stenosis of unspecified cerebral artery: Secondary | ICD-10-CM | POA: Diagnosis not present

## 2012-05-21 DIAGNOSIS — I2589 Other forms of chronic ischemic heart disease: Secondary | ICD-10-CM | POA: Diagnosis not present

## 2012-08-12 ENCOUNTER — Other Ambulatory Visit: Payer: Self-pay | Admitting: Physician Assistant

## 2012-08-12 ENCOUNTER — Telehealth: Payer: Self-pay

## 2012-08-12 DIAGNOSIS — M25561 Pain in right knee: Secondary | ICD-10-CM

## 2012-08-12 NOTE — Telephone Encounter (Signed)
Patient wants to be referred to Dr. Ophelia Charter in [place] for Right knee pain

## 2012-08-13 NOTE — Telephone Encounter (Signed)
Referral entered. Encounter closed. 

## 2012-08-15 DIAGNOSIS — M76899 Other specified enthesopathies of unspecified lower limb, excluding foot: Secondary | ICD-10-CM | POA: Diagnosis not present

## 2012-08-15 DIAGNOSIS — M9420 Chondromalacia, unspecified site: Secondary | ICD-10-CM | POA: Diagnosis not present

## 2012-08-15 DIAGNOSIS — M12269 Villonodular synovitis (pigmented), unspecified knee: Secondary | ICD-10-CM | POA: Diagnosis not present

## 2012-08-20 DIAGNOSIS — I2589 Other forms of chronic ischemic heart disease: Secondary | ICD-10-CM | POA: Diagnosis not present

## 2012-09-05 DIAGNOSIS — M76899 Other specified enthesopathies of unspecified lower limb, excluding foot: Secondary | ICD-10-CM | POA: Diagnosis not present

## 2012-09-23 DIAGNOSIS — I2510 Atherosclerotic heart disease of native coronary artery without angina pectoris: Secondary | ICD-10-CM | POA: Diagnosis not present

## 2012-09-23 DIAGNOSIS — Z9861 Coronary angioplasty status: Secondary | ICD-10-CM | POA: Diagnosis not present

## 2012-10-17 DIAGNOSIS — N200 Calculus of kidney: Secondary | ICD-10-CM | POA: Diagnosis not present

## 2012-11-13 DIAGNOSIS — L570 Actinic keratosis: Secondary | ICD-10-CM | POA: Diagnosis not present

## 2012-11-13 DIAGNOSIS — W57XXXA Bitten or stung by nonvenomous insect and other nonvenomous arthropods, initial encounter: Secondary | ICD-10-CM | POA: Diagnosis not present

## 2012-11-14 DIAGNOSIS — Z85828 Personal history of other malignant neoplasm of skin: Secondary | ICD-10-CM | POA: Insufficient documentation

## 2012-11-20 DIAGNOSIS — I2589 Other forms of chronic ischemic heart disease: Secondary | ICD-10-CM | POA: Diagnosis not present

## 2012-12-18 ENCOUNTER — Ambulatory Visit (INDEPENDENT_AMBULATORY_CARE_PROVIDER_SITE_OTHER): Payer: Medicare Other | Admitting: Family Medicine

## 2012-12-18 ENCOUNTER — Encounter: Payer: Self-pay | Admitting: Family Medicine

## 2012-12-18 VITALS — BP 121/68 | HR 75 | Temp 99.0°F | Ht 68.0 in | Wt 237.6 lb

## 2012-12-18 DIAGNOSIS — B029 Zoster without complications: Secondary | ICD-10-CM

## 2012-12-18 DIAGNOSIS — M542 Cervicalgia: Secondary | ICD-10-CM

## 2012-12-18 MED ORDER — OXYCODONE-ACETAMINOPHEN 5-325 MG PO TABS: ORAL_TABLET | ORAL | Status: DC

## 2012-12-18 MED ORDER — ACYCLOVIR 800 MG PO TABS: ORAL_TABLET | ORAL | Status: DC

## 2012-12-18 MED ORDER — PREDNISONE 10 MG PO TABS: ORAL_TABLET | ORAL | Status: DC

## 2012-12-18 NOTE — Patient Instructions (Signed)
Shingles Shingles (herpes zoster) is an infection that is caused by the same virus that causes chickenpox (varicella). The infection causes a painful skin rash and fluid-filled blisters, which eventually break open, crust over, and heal. It may occur in any area of the body, but it usually affects only one side of the body or face. The pain of shingles usually lasts about 1 month. However, some people with shingles may develop long-term (chronic) pain in the affected area of the body. Shingles often occurs many years after the person had chickenpox. It is more common:  In people older than 50 years.  In people with weakened immune systems, such as those with HIV, AIDS, or cancer.  In people taking medicines that weaken the immune system, such as transplant medicines.  In people under great stress. CAUSES  Shingles is caused by the varicella zoster virus (VZV), which also causes chickenpox. After a person is infected with the virus, it can remain in the person's body for years in an inactive state (dormant). To cause shingles, the virus reactivates and breaks out as an infection in a nerve root. The virus can be spread from person to person (contagious) through contact with open blisters of the shingles rash. It will only spread to people who have not had chickenpox. When these people are exposed to the virus, they may develop chickenpox. They will not develop shingles. Once the blisters scab over, the person is no longer contagious and cannot spread the virus to others. SYMPTOMS  Shingles shows up in stages. The initial symptoms may be pain, itching, and tingling in an area of the skin. This pain is usually described as burning, stabbing, or throbbing.In a few days or weeks, a painful red rash will appear in the area where the pain, itching, and tingling were felt. The rash is usually on one side of the body in a band or belt-like pattern. Then, the rash usually turns into fluid-filled blisters. They  will scab over and dry up in approximately 2 3 weeks. Flu-like symptoms may also occur with the initial symptoms, the rash, or the blisters. These may include:  Fever.  Chills.  Headache.  Upset stomach. DIAGNOSIS  Your caregiver will perform a skin exam to diagnose shingles. Skin scrapings or fluid samples may also be taken from the blisters. This sample will be examined under a microscope or sent to a lab for further testing. TREATMENT  There is no specific cure for shingles. Your caregiver will likely prescribe medicines to help you manage the pain, recover faster, and avoid long-term problems. This may include antiviral drugs, anti-inflammatory drugs, and pain medicines. HOME CARE INSTRUCTIONS   Take a cool bath or apply cool compresses to the area of the rash or blisters as directed. This may help with the pain and itching.   Only take over-the-counter or prescription medicines as directed by your caregiver.   Rest as directed by your caregiver.  Keep your rash and blisters clean with mild soap and cool water or as directed by your caregiver.  Do not pick your blisters or scratch your rash. Apply an anti-itch cream or numbing creams to the affected area as directed by your caregiver.  Keep your shingles rash covered with a loose bandage (dressing).  Avoid skin contact with:  Babies.   Pregnant women.   Children with eczema.   Elderly people with transplants.   People with chronic illnesses, such as leukemia or AIDS.   Wear loose-fitting clothing to help ease   the pain of material rubbing against the rash.  Keep all follow-up appointments with your caregiver.If the area involved is on your face, you may receive a referral for follow-up to a specialist, such as an eye doctor (ophthalmologist) or an ear, nose, and throat (ENT) doctor. Keeping all follow-up appointments will help you avoid eye complications, chronic pain, or disability.  SEEK IMMEDIATE MEDICAL  CARE IF:   You have facial pain, pain around the eye area, or loss of feeling on one side of your face.  You have ear pain or ringing in your ear.  You have loss of taste.  Your pain is not relieved with prescribed medicines.   Your redness or swelling spreads.   You have more pain and swelling.  Your condition is worsening or has changed.   You have a feveror persistent symptoms for more than 2 3 days.  You have a fever and your symptoms suddenly get worse. MAKE SURE YOU:  Understand these instructions.  Will watch your condition.  Will get help right away if you are not doing well or get worse. Document Released: 05/08/2005 Document Revised: 01/31/2012 Document Reviewed: 12/21/2011 [employer] Patient Information 2014 [employer].  

## 2012-12-18 NOTE — Progress Notes (Signed)
  Subjective:    Patient ID: SADRAC ZEOLI, male    DOB: December 08, 1945, 67 y.o.   MRN: [id number]  HPI This 67 y.o. male presents for evaluation of right arm, shoulder, neck discomfort that Is severe.  He states hydrocone and oxycodone do not help.  He states he is losing his Grip in his right hand..   Review of Systems No chest pain, SOB, HA, dizziness, vision change, N/V, diarrhea, constipation, dysuria, urinary urgency or frequency, myalgias, arthralgias or rash.     Objective:   Physical Exam Vital signs noted  Well developed well nourished male.  HEENT - Head atraumatic Normocephalic                Throat - oropharanx wnl Respiratory - Lungs CTA bilateral Cardiac - RRR S1 and S2 without murmur. Skin - Vesicular rash right lateral neck, right trapezius region, Right elbow and arm. MS - Decreased strength and decreased strength right grips.      Assessment & Plan:  Shingles - Plan: acyclovir (ZOVIRAX) 800 MG tablet, predniSONE (DELTASONE) 10 MG tablet, oxyCODONE-acetaminophen (ROXICET) 5-325 MG per tablet  Discussed with patient that if his sx's continue follow up.  Neck pain - Plan: acyclovir (ZOVIRAX) 800 MG tablet, predniSONE (DELTASONE) 10 MG tablet, oxyCODONE-acetaminophen (ROXICET) 5-325 MG per tablet.  Discussed with patient to follow up if sx's continue.

## 2012-12-31 ENCOUNTER — Encounter: Payer: Self-pay | Admitting: Family Medicine

## 2012-12-31 ENCOUNTER — Ambulatory Visit (INDEPENDENT_AMBULATORY_CARE_PROVIDER_SITE_OTHER): Payer: Medicare Other | Admitting: Family Medicine

## 2012-12-31 ENCOUNTER — Telehealth: Payer: Self-pay | Admitting: Family Medicine

## 2012-12-31 VITALS — BP 106/67 | HR 81 | Temp 99.1°F | Wt 234.4 lb

## 2012-12-31 DIAGNOSIS — B029 Zoster without complications: Secondary | ICD-10-CM

## 2012-12-31 DIAGNOSIS — M542 Cervicalgia: Secondary | ICD-10-CM

## 2012-12-31 MED ORDER — OXYCODONE-ACETAMINOPHEN 5-325 MG PO TABS: ORAL_TABLET | ORAL | Status: DC

## 2012-12-31 MED ORDER — GABAPENTIN 300 MG PO CAPS: 300.0000 mg | ORAL_CAPSULE | Freq: Three times a day (TID) | ORAL | Status: DC

## 2012-12-31 NOTE — Progress Notes (Signed)
  Subjective:    Patient ID: Edward Heath, male    DOB: [birth year], 67 y.o.   MRN: [id number]  HPI This 67 y.o. male presents for evaluation of right shoulder, neck, arm, and ear discomfort for a week. He was seen last week with the same sx's and dx with shingles.  He states the rash is gone but the Pain is worse.  He is out of pain medicine and he states he has severe pain in his right ear down to his Right hand .   Review of Systems C/o neuropathic pain in right ear to right hand. No chest pain, SOB, HA, dizziness, vision change, N/V, diarrhea, constipation, dysuria, urinary urgency or frequency, myalgias, arthralgias or rash.     Objective:   Physical Exam Vital signs noted  Well developed well nourished male in pain.  HEENT - Head atraumatic Normocephalic                Eyes - PERRLA, Conjuctiva - clear Sclera- Clear EOMI                Ears - EAC's Wnl TM's Wnl Gross Hearing WNL                Nose - Nares patent                 Throat - oropharanx wnl Respiratory - Lungs CTA bilateral Cardiac - RRR S1 and S2 without murmur GI - Abdomen soft Nontender and bowel sounds active x 4 Extremities - No edema. Neuro - Grossly intact. Skin - Light erythema patches right forearm and right shoulder.      Assessment & Plan:  Shingles - Plan: oxyCODONE-acetaminophen (ROXICET) 5-325 MG per tablet, gabapentin (NEURONTIN) 300 MG capsule  Neck pain - Plan: oxyCODONE-acetaminophen (ROXICET) 5-325 MG per tablet, gabapentin (NEURONTIN) 300 MG capsule. Discussed starting with the gabapentin qhs for next 3-7 days and then increasing up to bid for 3-7 days then going to tid.  Follow up prn if pain doesn't improve.

## 2013-01-09 ENCOUNTER — Other Ambulatory Visit: Payer: Self-pay

## 2013-01-09 DIAGNOSIS — B029 Zoster without complications: Secondary | ICD-10-CM

## 2013-01-09 DIAGNOSIS — M542 Cervicalgia: Secondary | ICD-10-CM

## 2013-01-09 MED ORDER — ACYCLOVIR 800 MG PO TABS: ORAL_TABLET | ORAL | Status: DC

## 2013-01-09 NOTE — Telephone Encounter (Signed)
Last seen 12/31/12  B [place]  Last filled 12/18/12

## 2013-01-30 ENCOUNTER — Other Ambulatory Visit: Payer: Self-pay | Admitting: Family Medicine

## 2013-02-06 DIAGNOSIS — R310 Gross hematuria: Secondary | ICD-10-CM | POA: Diagnosis not present

## 2013-02-06 DIAGNOSIS — N200 Calculus of kidney: Secondary | ICD-10-CM | POA: Diagnosis not present

## 2013-02-10 ENCOUNTER — Ambulatory Visit (INDEPENDENT_AMBULATORY_CARE_PROVIDER_SITE_OTHER): Payer: Medicare Other | Admitting: Family Medicine

## 2013-02-10 ENCOUNTER — Encounter: Payer: Self-pay | Admitting: Family Medicine

## 2013-02-10 VITALS — BP 114/68 | HR 70 | Temp 98.7°F | Ht 68.0 in | Wt 233.0 lb

## 2013-02-10 DIAGNOSIS — B0229 Other postherpetic nervous system involvement: Secondary | ICD-10-CM | POA: Diagnosis not present

## 2013-02-10 DIAGNOSIS — B029 Zoster without complications: Secondary | ICD-10-CM

## 2013-02-10 DIAGNOSIS — M542 Cervicalgia: Secondary | ICD-10-CM

## 2013-02-10 MED ORDER — GABAPENTIN 300 MG PO CAPS: 600.0000 mg | ORAL_CAPSULE | Freq: Three times a day (TID) | ORAL | Status: DC

## 2013-02-10 MED ORDER — OXYCODONE-ACETAMINOPHEN 5-325 MG PO TABS: ORAL_TABLET | ORAL | Status: DC

## 2013-02-10 NOTE — Progress Notes (Signed)
  Subjective:    Patient ID: Edward Heath, male    DOB: [birth year], 67 y.o.   MRN: [id number]  HPI  This 67 y.o. male presents for evaluation of pain in his left neck from his left ear to his Left arm.  He has s/p herpetic neuralgia after having shingles outbreak a couple months ago. He does take gabapentin 300mg  po tid and this is not helping.  He is also having kidney stones and has Seen urology for this recently and he states he is going to do a CT of the abdomen in 2 weeks.  He is Having colicky pain that is severe.  He is having good urine stream.  He mentions he did have some Clots that he passed and he did not tell urology.   He is afebrile. He has hx of CAD and sees cardiology. He states he sees neurology, urology, cardiology at [facility] and he sees the [place].  He is here today Because of neck pain and back pain due to kidney stones.  Review of Systems    No chest pain, SOB, HA, dizziness, vision change, N/V, diarrhea, constipation, dysuria, urinary urgency or frequency, myalgias, arthralgias or rash.  Objective:   Physical Exam Vital signs noted  Well developed well nourished male.  HEENT - Head atraumatic Normocephalic                Eyes - PERRLA, Conjuctiva - clear Sclera- Clear EOMI                Ears - EAC's Wnl TM's Wnl Gross Hearing WNL                Nose - Nares patent                 Throat - oropharanx wnl Respiratory - Lungs CTA bilateral Cardiac - RRR S1 and S2 without murmur GI - Abdomen soft Nontender and bowel sounds active x 4 Extremities - No edema. Neuro - Grossly intact.       Assessment & Plan:  Shingles - Plan: gabapentin (NEURONTIN) 300 MG capsule, oxyCODONE-acetaminophen (ROXICET) 5-325 MG per tablet.  Discussed with patient that he has post herpetic neuralgia now from The shingles and he should get shingles vaccination to cut possible shingles outbreak in half and he Will get with the [place].  Neck pain - Plan: gabapentin (NEURONTIN) 300 MG  capsule, oxyCODONE-acetaminophen (ROXICET) 5-325 MG per #60, follow up prn for refill.  Post herpetic neuralgia - Plan: gabapentin (NEURONTIN) 300 MG capsule increase to 2po tid and That he should go up from one po tid to 2 po tid gradually.  Deatra Canter FNP

## 2013-02-10 NOTE — Patient Instructions (Signed)
Shingles Shingles (herpes zoster) is an infection that is caused by the same virus that causes chickenpox (varicella). The infection causes a painful skin rash and fluid-filled blisters, which eventually break open, crust over, and heal. It may occur in any area of the body, but it usually affects only one side of the body or face. The pain of shingles usually lasts about 1 month. However, some people with shingles may develop long-term (chronic) pain in the affected area of the body. Shingles often occurs many years after the person had chickenpox. It is more common:  In people older than 50 years.  In people with weakened immune systems, such as those with HIV, AIDS, or cancer.  In people taking medicines that weaken the immune system, such as transplant medicines.  In people under great stress. CAUSES  Shingles is caused by the varicella zoster virus (VZV), which also causes chickenpox. After a person is infected with the virus, it can remain in the person's body for years in an inactive state (dormant). To cause shingles, the virus reactivates and breaks out as an infection in a nerve root. The virus can be spread from person to person (contagious) through contact with open blisters of the shingles rash. It will only spread to people who have not had chickenpox. When these people are exposed to the virus, they may develop chickenpox. They will not develop shingles. Once the blisters scab over, the person is no longer contagious and cannot spread the virus to others. SYMPTOMS  Shingles shows up in stages. The initial symptoms may be pain, itching, and tingling in an area of the skin. This pain is usually described as burning, stabbing, or throbbing.In a few days or weeks, a painful red rash will appear in the area where the pain, itching, and tingling were felt. The rash is usually on one side of the body in a band or belt-like pattern. Then, the rash usually turns into fluid-filled blisters. They  will scab over and dry up in approximately 2 3 weeks. Flu-like symptoms may also occur with the initial symptoms, the rash, or the blisters. These may include:  Fever.  Chills.  Headache.  Upset stomach. DIAGNOSIS  Your caregiver will perform a skin exam to diagnose shingles. Skin scrapings or fluid samples may also be taken from the blisters. This sample will be examined under a microscope or sent to a lab for further testing. TREATMENT  There is no specific cure for shingles. Your caregiver will likely prescribe medicines to help you manage the pain, recover faster, and avoid long-term problems. This may include antiviral drugs, anti-inflammatory drugs, and pain medicines. HOME CARE INSTRUCTIONS   Take a cool bath or apply cool compresses to the area of the rash or blisters as directed. This may help with the pain and itching.   Only take over-the-counter or prescription medicines as directed by your caregiver.   Rest as directed by your caregiver.  Keep your rash and blisters clean with mild soap and cool water or as directed by your caregiver.  Do not pick your blisters or scratch your rash. Apply an anti-itch cream or numbing creams to the affected area as directed by your caregiver.  Keep your shingles rash covered with a loose bandage (dressing).  Avoid skin contact with:  Babies.   Pregnant women.   Children with eczema.   Elderly people with transplants.   People with chronic illnesses, such as leukemia or AIDS.   Wear loose-fitting clothing to help ease   the pain of material rubbing against the rash.  Keep all follow-up appointments with your caregiver.If the area involved is on your face, you may receive a referral for follow-up to a specialist, such as an eye doctor (ophthalmologist) or an ear, nose, and throat (ENT) doctor. Keeping all follow-up appointments will help you avoid eye complications, chronic pain, or disability.  SEEK IMMEDIATE MEDICAL  CARE IF:   You have facial pain, pain around the eye area, or loss of feeling on one side of your face.  You have ear pain or ringing in your ear.  You have loss of taste.  Your pain is not relieved with prescribed medicines.   Your redness or swelling spreads.   You have more pain and swelling.  Your condition is worsening or has changed.   You have a feveror persistent symptoms for more than 2 3 days.  You have a fever and your symptoms suddenly get worse. MAKE SURE YOU:  Understand these instructions.  Will watch your condition.  Will get help right away if you are not doing well or get worse. Document Released: 05/08/2005 Document Revised: 01/31/2012 Document Reviewed: 12/21/2011 [employer] Patient Information 2014 [employer].  

## 2013-02-17 DIAGNOSIS — I2589 Other forms of chronic ischemic heart disease: Secondary | ICD-10-CM | POA: Diagnosis not present

## 2013-03-06 DIAGNOSIS — Z23 Encounter for immunization: Secondary | ICD-10-CM | POA: Diagnosis not present

## 2013-04-03 DIAGNOSIS — M76899 Other specified enthesopathies of unspecified lower limb, excluding foot: Secondary | ICD-10-CM | POA: Diagnosis not present

## 2013-05-21 DIAGNOSIS — I428 Other cardiomyopathies: Secondary | ICD-10-CM | POA: Diagnosis not present

## 2013-05-21 DIAGNOSIS — Z95810 Presence of automatic (implantable) cardiac defibrillator: Secondary | ICD-10-CM | POA: Diagnosis not present

## 2013-06-04 DIAGNOSIS — G250 Essential tremor: Secondary | ICD-10-CM | POA: Diagnosis not present

## 2013-06-04 DIAGNOSIS — I6350 Cerebral infarction due to unspecified occlusion or stenosis of unspecified cerebral artery: Secondary | ICD-10-CM | POA: Diagnosis not present

## 2013-06-04 DIAGNOSIS — G252 Other specified forms of tremor: Secondary | ICD-10-CM | POA: Insufficient documentation

## 2013-08-07 DIAGNOSIS — M76899 Other specified enthesopathies of unspecified lower limb, excluding foot: Secondary | ICD-10-CM | POA: Diagnosis not present

## 2013-08-19 ENCOUNTER — Telehealth: Payer: Self-pay | Admitting: Family Medicine

## 2013-08-19 NOTE — Telephone Encounter (Signed)
Patient stated that his stomach is swelling and unable to use the bathroom patient advised to go to ER for evaluation.

## 2013-09-22 DIAGNOSIS — Z9861 Coronary angioplasty status: Secondary | ICD-10-CM | POA: Diagnosis not present

## 2013-09-22 DIAGNOSIS — I2510 Atherosclerotic heart disease of native coronary artery without angina pectoris: Secondary | ICD-10-CM | POA: Diagnosis not present

## 2013-10-04 DIAGNOSIS — I959 Hypotension, unspecified: Secondary | ICD-10-CM | POA: Diagnosis not present

## 2013-10-04 DIAGNOSIS — R9431 Abnormal electrocardiogram [ECG] [EKG]: Secondary | ICD-10-CM | POA: Diagnosis not present

## 2013-10-04 DIAGNOSIS — I509 Heart failure, unspecified: Secondary | ICD-10-CM | POA: Diagnosis not present

## 2013-10-04 DIAGNOSIS — I2510 Atherosclerotic heart disease of native coronary artery without angina pectoris: Secondary | ICD-10-CM | POA: Diagnosis not present

## 2013-10-04 DIAGNOSIS — Z95810 Presence of automatic (implantable) cardiac defibrillator: Secondary | ICD-10-CM | POA: Diagnosis not present

## 2013-10-04 DIAGNOSIS — I69998 Other sequelae following unspecified cerebrovascular disease: Secondary | ICD-10-CM | POA: Diagnosis not present

## 2013-10-04 DIAGNOSIS — I5042 Chronic combined systolic (congestive) and diastolic (congestive) heart failure: Secondary | ICD-10-CM | POA: Diagnosis not present

## 2013-10-04 DIAGNOSIS — M7989 Other specified soft tissue disorders: Secondary | ICD-10-CM | POA: Diagnosis not present

## 2013-10-04 DIAGNOSIS — R0789 Other chest pain: Secondary | ICD-10-CM | POA: Diagnosis not present

## 2013-10-04 DIAGNOSIS — R002 Palpitations: Secondary | ICD-10-CM | POA: Diagnosis not present

## 2013-10-04 DIAGNOSIS — Z7982 Long term (current) use of aspirin: Secondary | ICD-10-CM | POA: Diagnosis not present

## 2013-10-04 DIAGNOSIS — R6889 Other general symptoms and signs: Secondary | ICD-10-CM | POA: Diagnosis not present

## 2013-10-04 DIAGNOSIS — I252 Old myocardial infarction: Secondary | ICD-10-CM | POA: Diagnosis not present

## 2013-10-04 DIAGNOSIS — R079 Chest pain, unspecified: Secondary | ICD-10-CM | POA: Diagnosis not present

## 2013-10-04 DIAGNOSIS — I214 Non-ST elevation (NSTEMI) myocardial infarction: Secondary | ICD-10-CM | POA: Diagnosis not present

## 2013-10-04 DIAGNOSIS — I10 Essential (primary) hypertension: Secondary | ICD-10-CM | POA: Diagnosis present

## 2013-10-04 DIAGNOSIS — E8779 Other fluid overload: Secondary | ICD-10-CM | POA: Diagnosis not present

## 2013-10-04 DIAGNOSIS — Z9861 Coronary angioplasty status: Secondary | ICD-10-CM | POA: Diagnosis not present

## 2013-10-04 DIAGNOSIS — Z8711 Personal history of peptic ulcer disease: Secondary | ICD-10-CM | POA: Diagnosis not present

## 2013-10-04 DIAGNOSIS — E785 Hyperlipidemia, unspecified: Secondary | ICD-10-CM | POA: Diagnosis present

## 2013-10-04 DIAGNOSIS — Z7902 Long term (current) use of antithrombotics/antiplatelets: Secondary | ICD-10-CM | POA: Diagnosis not present

## 2013-10-04 DIAGNOSIS — I259 Chronic ischemic heart disease, unspecified: Secondary | ICD-10-CM | POA: Diagnosis not present

## 2013-10-04 DIAGNOSIS — I2589 Other forms of chronic ischemic heart disease: Secondary | ICD-10-CM | POA: Diagnosis not present

## 2013-10-04 DIAGNOSIS — Z006 Encounter for examination for normal comparison and control in clinical research program: Secondary | ICD-10-CM | POA: Diagnosis not present

## 2013-10-04 DIAGNOSIS — R5381 Other malaise: Secondary | ICD-10-CM | POA: Diagnosis present

## 2013-10-04 DIAGNOSIS — E119 Type 2 diabetes mellitus without complications: Secondary | ICD-10-CM | POA: Diagnosis present

## 2013-10-04 DIAGNOSIS — I5022 Chronic systolic (congestive) heart failure: Secondary | ICD-10-CM | POA: Diagnosis not present

## 2013-10-09 DIAGNOSIS — I214 Non-ST elevation (NSTEMI) myocardial infarction: Secondary | ICD-10-CM

## 2013-10-09 DIAGNOSIS — I509 Heart failure, unspecified: Secondary | ICD-10-CM | POA: Insufficient documentation

## 2013-10-09 HISTORY — DX: Non-ST elevation (NSTEMI) myocardial infarction: I21.4

## 2013-10-16 ENCOUNTER — Ambulatory Visit (INDEPENDENT_AMBULATORY_CARE_PROVIDER_SITE_OTHER): Payer: Medicare Other | Admitting: Family Medicine

## 2013-10-16 ENCOUNTER — Encounter: Payer: Self-pay | Admitting: Family Medicine

## 2013-10-16 VITALS — BP 108/66 | HR 83 | Temp 98.2°F | Ht 68.0 in | Wt 233.0 lb

## 2013-10-16 DIAGNOSIS — M25569 Pain in unspecified knee: Secondary | ICD-10-CM | POA: Diagnosis not present

## 2013-10-16 DIAGNOSIS — M1710 Unilateral primary osteoarthritis, unspecified knee: Secondary | ICD-10-CM | POA: Diagnosis not present

## 2013-10-16 DIAGNOSIS — I2510 Atherosclerotic heart disease of native coronary artery without angina pectoris: Secondary | ICD-10-CM

## 2013-10-16 DIAGNOSIS — M25561 Pain in right knee: Secondary | ICD-10-CM

## 2013-10-16 MED ORDER — HYDROCODONE-ACETAMINOPHEN 5-325 MG PO TABS: 1.0000 | ORAL_TABLET | Freq: Four times a day (QID) | ORAL | Status: DC | PRN

## 2013-10-16 NOTE — Progress Notes (Signed)
   Subjective:    Patient ID: Edward Heath, male    DOB: 24-Dec-1945, 68 y.o.   MRN: [id number]  HPI This 68 y.o. male presents for evaluation of hospital follow up.  He has been having some severe left knee pain after coming off his tramadol.  He has resumed the tramadol but his pain is still severe. He had some coronary Pci done for chest pain.  He had coronary artery stent and has not had any chest pain since.  He is taking plavix.   Review of Systems C/o left knee pain No chest pain, SOB, HA, dizziness, vision change, N/V, diarrhea, constipation, dysuria, urinary urgency or frequency, myalgias, arthralgias or rash.     Objective:   Physical Exam  Vital signs noted  Well developed well nourished male.  HEENT - Head atraumatic Normocephalic                Eyes - PERRLA, Conjuctiva - clear Sclera- Clear EOMI                Ears - EAC's Wnl TM's Wnl Gross Hearing WNL                Nose - Nares patent                 Throat - oropharanx wnl Respiratory - Lungs CTA bilateral Cardiac - RRR S1 and S2 without murmur GI - Abdomen soft Nontender and bowel sounds active x 4 Extremities - No edema. Neuro - Grossly intact. MS - TTP left knee     Assessment & Plan:  Knee pain, right - Norco 5mg  one po q6 hours prn  CAD (coronary artery disease) - Follow up with cardiology  Lysbeth Penner FNP

## 2013-11-17 DIAGNOSIS — Z85828 Personal history of other malignant neoplasm of skin: Secondary | ICD-10-CM | POA: Diagnosis not present

## 2013-11-17 DIAGNOSIS — L570 Actinic keratosis: Secondary | ICD-10-CM | POA: Diagnosis not present

## 2013-11-17 DIAGNOSIS — D692 Other nonthrombocytopenic purpura: Secondary | ICD-10-CM | POA: Diagnosis not present

## 2013-11-17 DIAGNOSIS — T1490XA Injury, unspecified, initial encounter: Secondary | ICD-10-CM | POA: Diagnosis not present

## 2013-11-17 DIAGNOSIS — L819 Disorder of pigmentation, unspecified: Secondary | ICD-10-CM | POA: Diagnosis not present

## 2013-11-17 DIAGNOSIS — L821 Other seborrheic keratosis: Secondary | ICD-10-CM | POA: Diagnosis not present

## 2013-11-17 DIAGNOSIS — I781 Nevus, non-neoplastic: Secondary | ICD-10-CM | POA: Diagnosis not present

## 2013-11-24 DIAGNOSIS — Z9861 Coronary angioplasty status: Secondary | ICD-10-CM | POA: Diagnosis not present

## 2013-11-24 DIAGNOSIS — I2510 Atherosclerotic heart disease of native coronary artery without angina pectoris: Secondary | ICD-10-CM | POA: Diagnosis not present

## 2014-05-25 DIAGNOSIS — I255 Ischemic cardiomyopathy: Secondary | ICD-10-CM | POA: Diagnosis not present

## 2014-05-25 DIAGNOSIS — Z95810 Presence of automatic (implantable) cardiac defibrillator: Secondary | ICD-10-CM | POA: Diagnosis not present

## 2014-05-25 DIAGNOSIS — Z95818 Presence of other cardiac implants and grafts: Secondary | ICD-10-CM | POA: Diagnosis not present

## 2014-05-25 DIAGNOSIS — Z45018 Encounter for adjustment and management of other part of cardiac pacemaker: Secondary | ICD-10-CM | POA: Diagnosis not present

## 2014-05-27 DIAGNOSIS — G252 Other specified forms of tremor: Secondary | ICD-10-CM | POA: Diagnosis not present

## 2014-06-22 DIAGNOSIS — C4490 Unspecified malignant neoplasm of skin, unspecified: Secondary | ICD-10-CM

## 2014-06-22 HISTORY — DX: Unspecified malignant neoplasm of skin, unspecified: C44.90

## 2014-06-22 HISTORY — PX: SKIN CANCER EXCISION: SHX779

## 2014-06-25 DIAGNOSIS — I781 Nevus, non-neoplastic: Secondary | ICD-10-CM | POA: Diagnosis not present

## 2014-06-25 DIAGNOSIS — E785 Hyperlipidemia, unspecified: Secondary | ICD-10-CM | POA: Diagnosis not present

## 2014-06-25 DIAGNOSIS — L570 Actinic keratosis: Secondary | ICD-10-CM | POA: Diagnosis not present

## 2014-06-25 DIAGNOSIS — L821 Other seborrheic keratosis: Secondary | ICD-10-CM | POA: Diagnosis not present

## 2014-06-25 DIAGNOSIS — C44329 Squamous cell carcinoma of skin of other parts of face: Secondary | ICD-10-CM | POA: Diagnosis not present

## 2014-06-25 DIAGNOSIS — Z85828 Personal history of other malignant neoplasm of skin: Secondary | ICD-10-CM | POA: Diagnosis not present

## 2014-06-25 DIAGNOSIS — I255 Ischemic cardiomyopathy: Secondary | ICD-10-CM | POA: Diagnosis not present

## 2014-06-25 DIAGNOSIS — I2510 Atherosclerotic heart disease of native coronary artery without angina pectoris: Secondary | ICD-10-CM | POA: Diagnosis not present

## 2014-06-25 DIAGNOSIS — I10 Essential (primary) hypertension: Secondary | ICD-10-CM | POA: Diagnosis not present

## 2014-06-25 DIAGNOSIS — Z95818 Presence of other cardiac implants and grafts: Secondary | ICD-10-CM | POA: Diagnosis not present

## 2014-06-25 DIAGNOSIS — E119 Type 2 diabetes mellitus without complications: Secondary | ICD-10-CM | POA: Diagnosis not present

## 2014-06-25 DIAGNOSIS — Z8673 Personal history of transient ischemic attack (TIA), and cerebral infarction without residual deficits: Secondary | ICD-10-CM | POA: Diagnosis not present

## 2014-06-25 DIAGNOSIS — L818 Other specified disorders of pigmentation: Secondary | ICD-10-CM | POA: Diagnosis not present

## 2014-06-25 DIAGNOSIS — D485 Neoplasm of uncertain behavior of skin: Secondary | ICD-10-CM | POA: Diagnosis not present

## 2014-06-25 DIAGNOSIS — I509 Heart failure, unspecified: Secondary | ICD-10-CM | POA: Diagnosis not present

## 2014-07-13 DIAGNOSIS — C44329 Squamous cell carcinoma of skin of other parts of face: Secondary | ICD-10-CM | POA: Diagnosis not present

## 2014-07-13 DIAGNOSIS — Z950 Presence of cardiac pacemaker: Secondary | ICD-10-CM | POA: Diagnosis not present

## 2014-07-13 DIAGNOSIS — Z85828 Personal history of other malignant neoplasm of skin: Secondary | ICD-10-CM | POA: Diagnosis not present

## 2014-07-20 DIAGNOSIS — Z8589 Personal history of malignant neoplasm of other organs and systems: Secondary | ICD-10-CM | POA: Diagnosis not present

## 2014-08-05 ENCOUNTER — Ambulatory Visit (INDEPENDENT_AMBULATORY_CARE_PROVIDER_SITE_OTHER): Payer: Medicare Other | Admitting: *Deleted

## 2014-08-05 ENCOUNTER — Encounter: Payer: Self-pay | Admitting: *Deleted

## 2014-08-05 VITALS — BP 98/58 | HR 81 | Ht 67.0 in | Wt 249.0 lb

## 2014-08-05 DIAGNOSIS — Z0000 Encounter for general adult medical examination without abnormal findings: Secondary | ICD-10-CM | POA: Diagnosis not present

## 2014-08-05 NOTE — Patient Instructions (Signed)
Preventive Care for Adults A healthy lifestyle and preventive care can promote health and wellness. Preventive health guidelines for men include the following key practices:  A routine yearly physical is a good way to check with your health care provider about your health and preventative screening. It is a chance to share any concerns and updates on your health and to receive a thorough exam.  Visit your dentist for a routine exam and preventative care every 6 months. Brush your teeth twice a day and floss once a day. Good oral hygiene prevents tooth decay and gum disease.  The frequency of eye exams is based on your age, health, family medical history, use of contact lenses, and other factors. Follow your health care provider's recommendations for frequency of eye exams.  Eat a healthy diet. Foods such as vegetables, fruits, whole grains, low-fat dairy products, and lean protein foods contain the nutrients you need without too many calories. Decrease your intake of foods high in solid fats, added sugars, and salt. Eat the right amount of calories for you.Get information about a proper diet from your health care provider, if necessary.  Regular physical exercise is one of the most important things you can do for your health. Most adults should get at least 150 minutes of moderate-intensity exercise (any activity that increases your heart rate and causes you to sweat) each week. In addition, most adults need muscle-strengthening exercises on 2 or more days a week.  Maintain a healthy weight. The body mass index (BMI) is a screening tool to identify possible weight problems. It provides an estimate of body fat based on height and weight. Your health care provider can find your BMI and can help you achieve or maintain a healthy weight.For adults 20 years and older:  A BMI below 18.5 is considered underweight.  A BMI of 18.5 to 24.9 is normal.  A BMI of 25 to 29.9 is considered overweight.  A BMI  of 30 and above is considered obese.  Maintain normal blood lipids and cholesterol levels by exercising and minimizing your intake of saturated fat. Eat a balanced diet with plenty of fruit and vegetables. Blood tests for lipids and cholesterol should begin at age 50 and be repeated every 5 years. If your lipid or cholesterol levels are high, you are over 50, or you are at high risk for heart disease, you may need your cholesterol levels checked more frequently.Ongoing high lipid and cholesterol levels should be treated with medicines if diet and exercise are not working.  If you smoke, find out from your health care provider how to quit. If you do not use tobacco, do not start.  Lung cancer screening is recommended for adults aged 73-80 years who are at high risk for developing lung cancer because of a history of smoking. A yearly low-dose CT scan of the lungs is recommended for people who have at least a 30-pack-year history of smoking and are a current smoker or have quit within the past 15 years. A pack year of smoking is smoking an average of 1 pack of cigarettes a day for 1 year (for example: 1 pack a day for 30 years or 2 packs a day for 15 years). Yearly screening should continue until the smoker has stopped smoking for at least 15 years. Yearly screening should be stopped for people who develop a health problem that would prevent them from having lung cancer treatment.  If you choose to drink alcohol, do not have more than  2 drinks per day. One drink is considered to be 12 ounces (355 mL) of beer, 5 ounces (148 mL) of wine, or 1.5 ounces (44 mL) of liquor.  Avoid use of street drugs. Do not share needles with anyone. Ask for help if you need support or instructions about stopping the use of drugs.  High blood pressure causes heart disease and increases the risk of stroke. Your blood pressure should be checked at least every 1-2 years. Ongoing high blood pressure should be treated with  medicines, if weight loss and exercise are not effective.  If you are 45-79 years old, ask your health care provider if you should take aspirin to prevent heart disease.  Diabetes screening involves taking a blood sample to check your fasting blood sugar level. This should be done once every 3 years, after age 45, if you are within normal weight and without risk factors for diabetes. Testing should be considered at a younger age or be carried out more frequently if you are overweight and have at least 1 risk factor for diabetes.  Colorectal cancer can be detected and often prevented. Most routine colorectal cancer screening begins at the age of 50 and continues through age 75. However, your health care provider may recommend screening at an earlier age if you have risk factors for colon cancer. On a yearly basis, your health care provider may provide home test kits to check for hidden blood in the stool. Use of a small camera at the end of a tube to directly examine the colon (sigmoidoscopy or colonoscopy) can detect the earliest forms of colorectal cancer. Talk to your health care provider about this at age 50, when routine screening begins. Direct exam of the colon should be repeated every 5-10 years through age 75, unless early forms of precancerous polyps or small growths are found.  People who are at an increased risk for hepatitis B should be screened for this virus. You are considered at high risk for hepatitis B if:  You were born in a country where hepatitis B occurs often. Talk with your health care provider about which countries are considered high risk.  Your parents were born in a high-risk country and you have not received a shot to protect against hepatitis B (hepatitis B vaccine).  You have HIV or AIDS.  You use needles to inject street drugs.  You live with, or have sex with, someone who has hepatitis B.  You are a man who has sex with other men (MSM).  You get hemodialysis  treatment.  You take certain medicines for conditions such as cancer, organ transplantation, and autoimmune conditions.  Hepatitis C blood testing is recommended for all people born from 1945 through 1965 and any individual with known risks for hepatitis C.  Practice safe sex. Use condoms and avoid high-risk sexual practices to reduce the spread of sexually transmitted infections (STIs). STIs include gonorrhea, chlamydia, syphilis, trichomonas, herpes, HPV, and human immunodeficiency virus (HIV). Herpes, HIV, and HPV are viral illnesses that have no cure. They can result in disability, cancer, and death.  If you are at risk of being infected with HIV, it is recommended that you take a prescription medicine daily to prevent HIV infection. This is called preexposure prophylaxis (PrEP). You are considered at risk if:  You are a man who has sex with other men (MSM) and have other risk factors.  You are a heterosexual man, are sexually active, and are at increased risk for HIV infection.    You take drugs by injection.  You are sexually active with a partner who has HIV.  Talk with your health care provider about whether you are at high risk of being infected with HIV. If you choose to begin PrEP, you should first be tested for HIV. You should then be tested every 3 months for as long as you are taking PrEP.  A one-time screening for abdominal aortic aneurysm (AAA) and surgical repair of large AAAs by ultrasound are recommended for men ages 32 to 67 years who are current or former smokers.  Healthy men should no longer receive prostate-specific antigen (PSA) blood tests as part of routine cancer screening. Talk with your health care provider about prostate cancer screening.  Testicular cancer screening is not recommended for adult males who have no symptoms. Screening includes self-exam, a health care provider exam, and other screening tests. Consult with your health care provider about any symptoms  you have or any concerns you have about testicular cancer.  Use sunscreen. Apply sunscreen liberally and repeatedly throughout the day. You should seek shade when your shadow is shorter than you. Protect yourself by wearing long sleeves, pants, a wide-brimmed hat, and sunglasses year round, whenever you are outdoors.  Once a month, do a whole-body skin exam, using a mirror to look at the skin on your back. Tell your health care provider about new moles, moles that have irregular borders, moles that are larger than a pencil eraser, or moles that have changed in shape or color.  Stay current with required vaccines (immunizations).  Influenza vaccine. All adults should be immunized every year.  Tetanus, diphtheria, and acellular pertussis (Td, Tdap) vaccine. An adult who has not previously received Tdap or who does not know his vaccine status should receive 1 dose of Tdap. This initial dose should be followed by tetanus and diphtheria toxoids (Td) booster doses every 10 years. Adults with an unknown or incomplete history of completing a 3-dose immunization series with Td-containing vaccines should begin or complete a primary immunization series including a Tdap dose. Adults should receive a Td booster every 10 years.  Varicella vaccine. An adult without evidence of immunity to varicella should receive 2 doses or a second dose if he has previously received 1 dose.  Human papillomavirus (HPV) vaccine. Males aged 68-21 years who have not received the vaccine previously should receive the 3-dose series. Males aged 22-26 years may be immunized. Immunization is recommended through the age of 6 years for any male who has sex with males and did not get any or all doses earlier. Immunization is recommended for any person with an immunocompromised condition through the age of 49 years if he did not get any or all doses earlier. During the 3-dose series, the second dose should be obtained 4-8 weeks after the first  dose. The third dose should be obtained 24 weeks after the first dose and 16 weeks after the second dose.  Zoster vaccine. One dose is recommended for adults aged 50 years or older unless certain conditions are present.  Measles, mumps, and rubella (MMR) vaccine. Adults born before [birth year] generally are considered immune to measles and mumps. Adults born in [birth year] or later should have 1 or more doses of MMR vaccine unless there is a contraindication to the vaccine or there is laboratory evidence of immunity to each of the three diseases. A routine second dose of MMR vaccine should be obtained at least 28 days after the first dose for students attending postsecondary  schools, health care workers, or international travelers. People who received inactivated measles vaccine or an unknown type of measles vaccine during 1963-1967 should receive 2 doses of MMR vaccine. People who received inactivated mumps vaccine or an unknown type of mumps vaccine before 1979 and are at high risk for mumps infection should consider immunization with 2 doses of MMR vaccine. Unvaccinated health care workers born before 1957 who lack laboratory evidence of measles, mumps, or rubella immunity or laboratory confirmation of disease should consider measles and mumps immunization with 2 doses of MMR vaccine or rubella immunization with 1 dose of MMR vaccine.  Pneumococcal 13-valent conjugate (PCV13) vaccine. When indicated, a person who is uncertain of his immunization history and has no record of immunization should receive the PCV13 vaccine. An adult aged 19 years or older who has certain medical conditions and has not been previously immunized should receive 1 dose of PCV13 vaccine. This PCV13 should be followed with a dose of pneumococcal polysaccharide (PPSV23) vaccine. The PPSV23 vaccine dose should be obtained at least 8 weeks after the dose of PCV13 vaccine. An adult aged 19 years or older who has certain medical conditions and  previously received 1 or more doses of PPSV23 vaccine should receive 1 dose of PCV13. The PCV13 vaccine dose should be obtained 1 or more years after the last PPSV23 vaccine dose.  Pneumococcal polysaccharide (PPSV23) vaccine. When PCV13 is also indicated, PCV13 should be obtained first. All adults aged 65 years and older should be immunized. An adult younger than age 65 years who has certain medical conditions should be immunized. Any person who resides in a nursing home or long-term care facility should be immunized. An adult smoker should be immunized. People with an immunocompromised condition and certain other conditions should receive both PCV13 and PPSV23 vaccines. People with human immunodeficiency virus (HIV) infection should be immunized as soon as possible after diagnosis. Immunization during chemotherapy or radiation therapy should be avoided. Routine use of PPSV23 vaccine is not recommended for American Indians, [place] Natives, or people younger than 65 years unless there are medical conditions that require PPSV23 vaccine. When indicated, people who have unknown immunization and have no record of immunization should receive PPSV23 vaccine. One-time revaccination 5 years after the first dose of PPSV23 is recommended for people aged 19-64 years who have chronic kidney failure, nephrotic syndrome, asplenia, or immunocompromised conditions. People who received 1-2 doses of PPSV23 before age 65 years should receive another dose of PPSV23 vaccine at age 65 years or later if at least 5 years have passed since the previous dose. Doses of PPSV23 are not needed for people immunized with PPSV23 at or after age 65 years.  Meningococcal vaccine. Adults with asplenia or persistent complement component deficiencies should receive 2 doses of quadrivalent meningococcal conjugate (MenACWY-D) vaccine. The doses should be obtained at least 2 months apart. Microbiologists working with certain meningococcal bacteria,  military recruits, people at risk during an outbreak, and people who travel to or live in countries with a high rate of meningitis should be immunized. A first-year college student up through age 21 years who is living in a residence hall should receive a dose if he did not receive a dose on or after his 16th birthday. Adults who have certain high-risk conditions should receive one or more doses of vaccine.  Hepatitis A vaccine. Adults who wish to be protected from this disease, have certain high-risk conditions, work with hepatitis A-infected animals, work in hepatitis A research labs, or   travel to or work in countries with a high rate of hepatitis A should be immunized. Adults who were previously unvaccinated and who anticipate close contact with an international adoptee during the first 60 days after arrival in the [place] from a country with a high rate of hepatitis A should be immunized.  Hepatitis B vaccine. Adults should be immunized if they wish to be protected from this disease, have certain high-risk conditions, may be exposed to blood or other infectious body fluids, are household contacts or sex partners of hepatitis B positive people, are clients or workers in certain care facilities, or travel to or work in countries with a high rate of hepatitis B.  Haemophilus influenzae type b (Hib) vaccine. A previously unvaccinated person with asplenia or sickle cell disease or having a scheduled splenectomy should receive 1 dose of Hib vaccine. Regardless of previous immunization, a recipient of a hematopoietic stem cell transplant should receive a 3-dose series 6-12 months after his successful transplant. Hib vaccine is not recommended for adults with HIV infection. Ages 97 and over  Blood pressure check.** / Every 1 to 2 years.  Lipid and cholesterol check.**/ Every 5 years beginning at age 76.  Lung cancer screening. / Every year if you are aged 55-80 years and have a 30-pack-year history of  smoking and currently smoke or have quit within the past 15 years. Yearly screening is stopped once you have quit smoking for at least 15 years or develop a health problem that would prevent you from having lung cancer treatment.  Fecal occult blood test (FOBT) of stool. / Every year beginning at age 68 and continuing until age 83. You may not have to do this test if you get a colonoscopy every 10 years.  Flexible sigmoidoscopy** or colonoscopy.** / Every 5 years for a flexible sigmoidoscopy or every 10 years for a colonoscopy beginning at age 6 and continuing until age 68.  Hepatitis C blood test.** / For all people born from [birth year] through 1965 and any individual with known risks for hepatitis C.  Abdominal aortic aneurysm (AAA) screening.** / A one-time screening for ages 42 to 68 years who are current or former smokers.  Skin self-exam. / Monthly.  Influenza vaccine. / Every year.  Tetanus, diphtheria, and acellular pertussis (Tdap/Td) vaccine.** / 1 dose of Td every 10 years.  Varicella vaccine.** / Consult your health care provider.  Zoster vaccine.** / 1 dose for adults aged 76 years or older.  Pneumococcal 13-valent conjugate (PCV13) vaccine.** / Consult your health care provider.  Pneumococcal polysaccharide (PPSV23) vaccine.** / 1 dose for all adults aged 25 years and older.  Meningococcal vaccine.** / Consult your health care provider.  Hepatitis A vaccine.** / Consult your health care provider.  Hepatitis B vaccine.** / Consult your health care provider.  Haemophilus influenzae type b (Hib) vaccine.** / Consult your health care provider. **Family history and personal history of risk and conditions may change your health care provider's recommendations. Document Released: 07/04/2001 Document Revised: 05/13/2013 Document Reviewed: 10/03/2010 [facility] Patient Information 2015 [place], [place]. This information is not intended to replace advice given to you by your health care  provider. Make sure you discuss any questions you have with your health care provider.

## 2014-08-05 NOTE — Progress Notes (Signed)
Subjective:   Edward Heath is a 69 y.o. male who presents for an Initial Medicare Annual Wellness Visit.  Review of Systems     Objective:    Wt 249lb Ht 5'7" BMI 39 BP 98/58  P 81   Current Medications (verified) Outpatient Encounter Prescriptions as of 08/05/2014  Medication Sig  . aspirin EC 81 MG tablet Take 81 mg by mouth.  Marland Kitchen atorvastatin (LIPITOR) 40 MG tablet Take 40 mg by mouth daily.    . Cholecalciferol (VITAMIN D-1000 MAX ST) 1000 UNITS tablet Take 1,000 Units by mouth.  . clopidogrel (PLAVIX) 75 MG tablet Take 75 mg by mouth daily.    Marland Kitchen docusate (COLACE) 60 MG/15ML syrup Take 60 mg by mouth daily.    . furosemide (LASIX) 20 MG tablet Take 20 mg by mouth.  . gabapentin (NEURONTIN) 300 MG capsule Take 300 mg by mouth 2 (two) times daily.  . Ginseng 100 MG CAPS Take 1 capsule by mouth 2 (two) times daily.   Marland Kitchen glipiZIDE (GLUCOTROL) 5 MG tablet Take 2.5 mg by mouth 2 (two) times daily before a meal.   . lisinopril (PRINIVIL,ZESTRIL) 5 MG tablet Take 5 mg by mouth daily.    . Magnesium 250 MG TABS Take 500 mg by mouth 2 (two) times daily.   . metFORMIN (GLUCOPHAGE) 1000 MG tablet Take 1,000 mg by mouth 2 (two) times daily with a meal.   . metoprolol tartrate (LOPRESSOR) 25 MG tablet Take 25 mg by mouth 2 (two) times daily.   . nitroGLYCERIN (NITROSTAT) 0.4 MG SL tablet Place 0.4 mg under the tongue.  . Omega-3 1000 MG CAPS Take 2 g by mouth.  Marland Kitchen omeprazole (PRILOSEC) 10 MG capsule Take 20 mg by mouth 2 (two) times daily.   . Potassium 95 MG TBCR Take 1 tablet by mouth.   . vitamin B-12 (CYANOCOBALAMIN) 1000 MCG tablet Take 2,500 mcg by mouth.   . vitamin E (E-400) 400 UNIT capsule Take 400 Units by mouth.  . [DISCONTINUED] HYDROcodone-acetaminophen (NORCO) 5-325 MG per tablet Take 1 tablet by mouth every 6 (six) hours as needed for moderate pain.  . [DISCONTINUED] traMADol (ULTRAM) 50 MG tablet Take 50 mg by mouth 2 (two) times daily.     Allergies  (verified) Review of patient's allergies indicates no known allergies.   History: Past Medical History  Diagnosis Date  . Hypertension   . Stroke   . Hyperlipidemia   . GERD (gastroesophageal reflux disease)   . Skin cancer 06/2014    left cheek  . TIA (transient ischemic attack) 2009  . Heart attack 2004, 2015   Past Surgical History  Procedure Laterality Date  . 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 Skin cancer excision  06/2014   No family history on file. Social History   Occupational History  . Not on file.   Social History Main Topics  . Smoking status: Never Smoker   . Smokeless tobacco: Current User    Types: Chew  . Alcohol Use: Yes  . Drug Use: No  . Sexual Activity: Not on file     Activities of Daily Living In your present state of health, do you have any difficulty performing the following activities: 08/05/2014  Is the patient deaf or have difficulty hearing? N  Hearing N  Vision N  Difficulty concentrating or making decisions N  Walking or climbing stairs? N  Doing errands, shopping? N    Immunizations and Health Maintenance  Immunization History  Administered Date(s) Administered  . Influenza, High Dose Seasonal PF 03/06/2013  . Influenza-Unspecified 03/22/2014  . Pneumococcal Conjugate-13 03/22/2014  . Pneumococcal Polysaccharide-23 03/22/2013  . Zoster 11/19/2013   Health Maintenance Due  Topic Date Due  . TETANUS/TDAP  12/10/1963  . COLON CANCER SCREENING ANNUAL FOBT  12/10/1994    Patient Care Team: Chipper Herb, MD as PCP - General (Family Medicine)  Maple Mirza, MD (neurology) Eileen Stanford, MD (cardiology) Patient [facility] for most of his routine care  Indicate any recent Medical Services you may have received from other than [facility] providers in the past year (date may be approximate). Visit with neurologist Dr Thana Ates on 05/27/14 and visit with cardiologist Dr Quentin Cornwall on 09/22/13. They  are associated with [place].     Assessment:   This is a routine wellness examination for [name].  Hearing/Vision screen Patient has hearing aids and glasses to read but he doesn't wear them often. He only needs glasses for reading and reports no hearing diffuclties. Eye exam was around 03/2014.   Dietary issues and exercise activities discussed:  Patient plays golf 2-3 times a week.  He has had a gym membership in the past. Advised that this is a good idea. Suggested small hand held weights for home use and walking several times a week.   Depression Screen PHQ 2/9 Scores 08/05/2014 10/16/2013  PHQ - 2 Score 1 0  Reports feeling blue some during the winter but this improves in the spring time.    Fall Risk Fall Risk  08/05/2014 10/16/2013  Falls in the past year? Yes Yes  Number falls in past yr: 1 1  Injury with Fall? No Yes  Risk for fall due to : History of fall(s) -    Cognitive Function: MMSE - Mini Mental State Exam 08/05/2014  Orientation to time 5  Orientation to Place 5  Registration 3  Attention/ Calculation 4  Recall 2  Language- name 2 objects 2  Language- repeat 1  Language- follow 3 step command 3  Language- read & follow direction 1  Write a sentence 1  Copy design 1  Total score 28    Screening Tests Health Maintenance  Topic Date Due  . TETANUS/TDAP  12/10/1963  . COLON CANCER SCREENING ANNUAL FOBT  12/10/1994  . COLONOSCOPY  09/05/2014 (Originally 12/10/1994)  . INFLUENZA VACCINE  12/21/2014  . ZOSTAVAX  Completed  . PNA vac Low Risk Adult  Completed        Plan:     During the course of the visit Edward Heath was educated and counseled about the following appropriate screening and preventive services:   Vaccines to include Pneumocca-UTD, Influenza-UTD, Tdap-UTD, Zostavax-UTD  Electrocardiogram  Colorectal cancer screening-Patient was instructed by GI at [facility] that he no longer needed colonoscopies. FOBTs needed annually.   Cardiovascular disease  screening-Followed by Dr Quentin Cornwall with [facility]  Glaucoma screening-Yearly with eye exam. Last exam was 03/2014.   Prostate cancer screening-Screened at [facility]  Patient Instructions (the written plan) were given to the patient.   Ilean China, RN   08/05/2014     I have reviewed and agree with the above AWV documentation.  Claretta Fraise, M.D.

## 2014-09-24 DIAGNOSIS — I2510 Atherosclerotic heart disease of native coronary artery without angina pectoris: Secondary | ICD-10-CM | POA: Diagnosis not present

## 2014-10-22 ENCOUNTER — Other Ambulatory Visit: Payer: Self-pay

## 2014-10-22 MED ORDER — FUROSEMIDE 20 MG PO TABS: 20.0000 mg | ORAL_TABLET | Freq: Every day | ORAL | Status: DC

## 2014-11-17 DIAGNOSIS — I25110 Atherosclerotic heart disease of native coronary artery with unstable angina pectoris: Secondary | ICD-10-CM | POA: Diagnosis present

## 2014-11-17 DIAGNOSIS — I454 Nonspecific intraventricular block: Secondary | ICD-10-CM | POA: Diagnosis not present

## 2014-11-17 DIAGNOSIS — R110 Nausea: Secondary | ICD-10-CM | POA: Diagnosis not present

## 2014-11-17 DIAGNOSIS — I5022 Chronic systolic (congestive) heart failure: Secondary | ICD-10-CM | POA: Diagnosis not present

## 2014-11-17 DIAGNOSIS — R0789 Other chest pain: Secondary | ICD-10-CM | POA: Diagnosis not present

## 2014-11-17 DIAGNOSIS — K219 Gastro-esophageal reflux disease without esophagitis: Secondary | ICD-10-CM | POA: Diagnosis present

## 2014-11-17 DIAGNOSIS — Z8719 Personal history of other diseases of the digestive system: Secondary | ICD-10-CM | POA: Diagnosis not present

## 2014-11-17 DIAGNOSIS — F17220 Nicotine dependence, chewing tobacco, uncomplicated: Secondary | ICD-10-CM | POA: Diagnosis present

## 2014-11-17 DIAGNOSIS — I447 Left bundle-branch block, unspecified: Secondary | ICD-10-CM | POA: Diagnosis not present

## 2014-11-17 DIAGNOSIS — I10 Essential (primary) hypertension: Secondary | ICD-10-CM | POA: Diagnosis not present

## 2014-11-17 DIAGNOSIS — I493 Ventricular premature depolarization: Secondary | ICD-10-CM | POA: Diagnosis not present

## 2014-11-17 DIAGNOSIS — R0689 Other abnormalities of breathing: Secondary | ICD-10-CM | POA: Diagnosis not present

## 2014-11-17 DIAGNOSIS — D649 Anemia, unspecified: Secondary | ICD-10-CM | POA: Diagnosis not present

## 2014-11-17 DIAGNOSIS — R0602 Shortness of breath: Secondary | ICD-10-CM | POA: Diagnosis not present

## 2014-11-17 DIAGNOSIS — E785 Hyperlipidemia, unspecified: Secondary | ICD-10-CM | POA: Diagnosis not present

## 2014-11-17 DIAGNOSIS — Z955 Presence of coronary angioplasty implant and graft: Secondary | ICD-10-CM | POA: Diagnosis not present

## 2014-11-17 DIAGNOSIS — Z95810 Presence of automatic (implantable) cardiac defibrillator: Secondary | ICD-10-CM | POA: Diagnosis not present

## 2014-11-17 DIAGNOSIS — Z87442 Personal history of urinary calculi: Secondary | ICD-10-CM | POA: Diagnosis not present

## 2014-11-17 DIAGNOSIS — E119 Type 2 diabetes mellitus without complications: Secondary | ICD-10-CM | POA: Diagnosis present

## 2014-11-17 DIAGNOSIS — Z8673 Personal history of transient ischemic attack (TIA), and cerebral infarction without residual deficits: Secondary | ICD-10-CM | POA: Diagnosis not present

## 2014-11-17 DIAGNOSIS — Z7982 Long term (current) use of aspirin: Secondary | ICD-10-CM | POA: Diagnosis not present

## 2014-11-17 DIAGNOSIS — R079 Chest pain, unspecified: Secondary | ICD-10-CM | POA: Diagnosis not present

## 2014-11-17 DIAGNOSIS — K449 Diaphragmatic hernia without obstruction or gangrene: Secondary | ICD-10-CM | POA: Diagnosis present

## 2014-11-17 DIAGNOSIS — Z7902 Long term (current) use of antithrombotics/antiplatelets: Secondary | ICD-10-CM | POA: Diagnosis not present

## 2014-11-18 DIAGNOSIS — Z955 Presence of coronary angioplasty implant and graft: Secondary | ICD-10-CM | POA: Diagnosis not present

## 2014-11-18 DIAGNOSIS — I10 Essential (primary) hypertension: Secondary | ICD-10-CM | POA: Diagnosis not present

## 2014-11-18 DIAGNOSIS — D649 Anemia, unspecified: Secondary | ICD-10-CM | POA: Diagnosis not present

## 2014-11-18 DIAGNOSIS — I447 Left bundle-branch block, unspecified: Secondary | ICD-10-CM | POA: Diagnosis not present

## 2014-11-18 DIAGNOSIS — R079 Chest pain, unspecified: Secondary | ICD-10-CM | POA: Diagnosis not present

## 2014-11-18 DIAGNOSIS — I5022 Chronic systolic (congestive) heart failure: Secondary | ICD-10-CM | POA: Diagnosis not present

## 2014-11-18 DIAGNOSIS — E785 Hyperlipidemia, unspecified: Secondary | ICD-10-CM | POA: Diagnosis not present

## 2014-11-18 DIAGNOSIS — K219 Gastro-esophageal reflux disease without esophagitis: Secondary | ICD-10-CM | POA: Diagnosis not present

## 2014-11-18 DIAGNOSIS — I25110 Atherosclerotic heart disease of native coronary artery with unstable angina pectoris: Secondary | ICD-10-CM | POA: Diagnosis not present

## 2014-11-18 DIAGNOSIS — Z95810 Presence of automatic (implantable) cardiac defibrillator: Secondary | ICD-10-CM | POA: Diagnosis not present

## 2014-11-18 DIAGNOSIS — K449 Diaphragmatic hernia without obstruction or gangrene: Secondary | ICD-10-CM | POA: Diagnosis not present

## 2014-11-18 DIAGNOSIS — E119 Type 2 diabetes mellitus without complications: Secondary | ICD-10-CM | POA: Diagnosis not present

## 2014-11-18 DIAGNOSIS — Z7902 Long term (current) use of antithrombotics/antiplatelets: Secondary | ICD-10-CM | POA: Diagnosis not present

## 2015-01-26 DIAGNOSIS — Z23 Encounter for immunization: Secondary | ICD-10-CM | POA: Diagnosis not present

## 2015-02-03 DIAGNOSIS — L570 Actinic keratosis: Secondary | ICD-10-CM | POA: Diagnosis not present

## 2015-02-03 DIAGNOSIS — D1801 Hemangioma of skin and subcutaneous tissue: Secondary | ICD-10-CM | POA: Diagnosis not present

## 2015-02-03 DIAGNOSIS — Z85828 Personal history of other malignant neoplasm of skin: Secondary | ICD-10-CM | POA: Diagnosis not present

## 2015-02-03 DIAGNOSIS — C801 Malignant (primary) neoplasm, unspecified: Secondary | ICD-10-CM | POA: Diagnosis not present

## 2015-02-03 DIAGNOSIS — L814 Other melanin hyperpigmentation: Secondary | ICD-10-CM | POA: Diagnosis not present

## 2015-02-03 DIAGNOSIS — L821 Other seborrheic keratosis: Secondary | ICD-10-CM | POA: Diagnosis not present

## 2015-03-01 ENCOUNTER — Encounter: Payer: Self-pay | Admitting: Family Medicine

## 2015-03-01 ENCOUNTER — Ambulatory Visit (INDEPENDENT_AMBULATORY_CARE_PROVIDER_SITE_OTHER): Payer: Medicare Other

## 2015-03-01 ENCOUNTER — Ambulatory Visit (INDEPENDENT_AMBULATORY_CARE_PROVIDER_SITE_OTHER): Payer: Medicare Other | Admitting: Family Medicine

## 2015-03-01 VITALS — BP 117/74 | HR 75 | Temp 98.6°F | Ht 67.0 in | Wt 249.6 lb

## 2015-03-01 DIAGNOSIS — R058 Other specified cough: Secondary | ICD-10-CM

## 2015-03-01 DIAGNOSIS — R05 Cough: Secondary | ICD-10-CM

## 2015-03-01 DIAGNOSIS — J208 Acute bronchitis due to other specified organisms: Secondary | ICD-10-CM | POA: Diagnosis not present

## 2015-03-01 MED ORDER — HYDROCODONE-HOMATROPINE 5-1.5 MG/5ML PO SYRP: 5.0000 mL | ORAL_SOLUTION | Freq: Four times a day (QID) | ORAL | Status: DC | PRN

## 2015-03-01 MED ORDER — AMOXICILLIN-POT CLAVULANATE 875-125 MG PO TABS: 1.0000 | ORAL_TABLET | Freq: Two times a day (BID) | ORAL | Status: DC

## 2015-03-01 NOTE — Progress Notes (Signed)
Subjective:  Patient ID: Edward Heath, male    DOB: April 07, 1946  Age: 69 y.o. MRN: [id number]  CC: URI   HPI Edward Heath presents for Onset at noon yest of cough, tight chest. Wheeziing. Feeling dyspneic. No fever. Moderately severe cough.   History Edward Heath has a past medical history of Hypertension; Stroke (Edward Heath); Hyperlipidemia; GERD (gastroesophageal reflux disease); Skin cancer (06/2014); TIA (transient ischemic attack) (2009); Heart attack (Edward Heath) (2004, 2015); and Lumbar vertebral fracture (Edward Heath) (2012).   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   His family history includes Aneurysm in his father; Heart attack in his mother; Heart attack (age of onset: 86) in his brother.He reports that he has never smoked. His smokeless tobacco use includes Chew. He reports that he drinks alcohol. He reports that he does not use illicit drugs.  Outpatient Prescriptions Prior to Visit  Medication Sig Dispense Refill  . aspirin EC 81 MG tablet Take 81 mg by mouth.    Marland Kitchen atorvastatin (LIPITOR) 40 MG tablet Take 40 mg by mouth daily.      . Cholecalciferol (VITAMIN D-1000 MAX ST) 1000 UNITS tablet Take 1,000 Units by mouth.    . clopidogrel (PLAVIX) 75 MG tablet Take 75 mg by mouth daily.      Marland Kitchen docusate (COLACE) 60 MG/15ML syrup Take 60 mg by mouth daily.      . furosemide (LASIX) 20 MG tablet Take 1 tablet (20 mg total) by mouth daily. 30 tablet 2  . gabapentin (NEURONTIN) 300 MG capsule Take 300 mg by mouth 2 (two) times daily.    . Ginseng 100 MG CAPS Take 1 capsule by mouth 2 (two) times daily.     Marland Kitchen glipiZIDE (GLUCOTROL) 5 MG tablet Take 2.5 mg by mouth 2 (two) times daily before a meal.     . lisinopril (PRINIVIL,ZESTRIL) 5 MG tablet Take 5 mg by mouth daily.      . Magnesium 250 MG TABS Take 500 mg by mouth 2 (two) times daily.     . metFORMIN (GLUCOPHAGE) 1000 MG tablet Take 1,000 mg by mouth 2  (two) times daily with a meal.     . metoprolol tartrate (LOPRESSOR) 25 MG tablet Take 25 mg by mouth 2 (two) times daily.     . Omega-3 1000 MG CAPS Take 2 g by mouth.    Marland Kitchen omeprazole (PRILOSEC) 10 MG capsule Take 20 mg by mouth 2 (two) times daily.     . Potassium 95 MG TBCR Take 1 tablet by mouth.     . vitamin B-12 (CYANOCOBALAMIN) 1000 MCG tablet Take 2,500 mcg by mouth.     . vitamin E (E-400) 400 UNIT capsule Take 400 Units by mouth.    . nitroGLYCERIN (NITROSTAT) 0.4 MG SL tablet Place 0.4 mg under the tongue.     No facility-administered medications prior to visit.    ROS Review of Systems  Constitutional: Negative for fever, chills, activity change and appetite change.  HENT: Positive for congestion, postnasal drip, rhinorrhea and sinus pressure. Negative for ear discharge, ear pain, hearing loss, nosebleeds, sneezing and trouble swallowing.   Respiratory: Negative for chest tightness and shortness of breath.   Cardiovascular: Negative for chest pain and palpitations.  Skin: Negative for rash.    Objective:  BP 117/74 mmHg  Pulse 75  Temp(Src) 98.6 F (37 C) (Oral)  Ht 5\' 7"  (1.702 m)  Wt 249 lb  9.6 oz (113.218 kg)  BMI 39.08 kg/m2  BP Readings from Last 3 Encounters:  03/01/15 117/74  08/05/14 98/58  10/16/13 108/66    Wt Readings from Last 3 Encounters:  03/01/15 249 lb 9.6 oz (113.218 kg)  08/05/14 249 lb (112.946 kg)  10/16/13 233 lb (105.688 kg)     Physical Exam  Constitutional: He appears well-developed and well-nourished.  HENT:  Head: Normocephalic and atraumatic.  Right Ear: Tympanic membrane and external ear normal. No decreased hearing is noted.  Left Ear: Tympanic membrane and external ear normal. No decreased hearing is noted.  Nose: Mucosal edema present. Right sinus exhibits no frontal sinus tenderness. Left sinus exhibits no frontal sinus tenderness.  Mouth/Throat: No oropharyngeal exudate or posterior oropharyngeal erythema.  Neck: No  Brudzinski's sign noted.  Cardiovascular: Normal rate and regular rhythm.   No murmur heard. Pulmonary/Chest: No respiratory distress. He has wheezes.  Lymphadenopathy:       Head (right side): No preauricular adenopathy present.       Head (left side): No preauricular adenopathy present.       Right cervical: No superficial cervical adenopathy present.      Left cervical: No superficial cervical adenopathy present.    No results found for: HGBA1C  No results found for: WBC, HGB, HCT, PLT, GLUCOSE, CHOL, TRIG, HDL, LDLDIRECT, LDLCALC, ALT, AST, NA, K, CL, CREATININE, BUN, CO2, TSH, PSA, INR, GLUF, HGBA1C, MICROALBUR  No results found.  Assessment & Plan:   Edward Heath was seen today for uri.  Diagnoses and all orders for this visit:  Acute bronchitis due to other specified organisms  Productive cough -     DG Chest 2 View  Other orders -     amoxicillin-clavulanate (AUGMENTIN) 875-125 MG tablet; Take 1 tablet by mouth 2 (two) times daily. -     HYDROcodone-homatropine (HYCODAN) 5-1.5 MG/5ML syrup; Take 5 mLs by mouth every 6 (six) hours as needed for cough.   I am having Edward Heath start on amoxicillin-clavulanate and HYDROcodone-homatropine. I am also having him maintain his omeprazole, metoprolol tartrate, lisinopril, docusate, clopidogrel, atorvastatin, aspirin EC, Cholecalciferol, vitamin B-12, Ginseng, glipiZIDE, Magnesium, metFORMIN, nitroGLYCERIN, Omega-3, Potassium, vitamin E, gabapentin, and furosemide.  Meds ordered this encounter  Medications  . amoxicillin-clavulanate (AUGMENTIN) 875-125 MG tablet    Sig: Take 1 tablet by mouth 2 (two) times daily.    Dispense:  20 tablet    Refill:  0  . HYDROcodone-homatropine (HYCODAN) 5-1.5 MG/5ML syrup    Sig: Take 5 mLs by mouth every 6 (six) hours as needed for cough.    Dispense:  120 mL    Refill:  0     Follow-up: No Follow-up on file.  Edward Heath, M.D.

## 2015-03-08 ENCOUNTER — Other Ambulatory Visit: Payer: Self-pay | Admitting: Family Medicine

## 2015-03-08 NOTE — Telephone Encounter (Signed)
Seen and filled 03/01/15.

## 2015-03-08 NOTE — Telephone Encounter (Signed)
Pt would like refill of Hycodan for continued cough Please review and advise

## 2015-05-18 DIAGNOSIS — Z95810 Presence of automatic (implantable) cardiac defibrillator: Secondary | ICD-10-CM | POA: Diagnosis not present

## 2015-05-18 DIAGNOSIS — Z4502 Encounter for adjustment and management of automatic implantable cardiac defibrillator: Secondary | ICD-10-CM | POA: Diagnosis not present

## 2015-05-18 DIAGNOSIS — Z95818 Presence of other cardiac implants and grafts: Secondary | ICD-10-CM | POA: Diagnosis not present

## 2015-05-18 DIAGNOSIS — I255 Ischemic cardiomyopathy: Secondary | ICD-10-CM | POA: Diagnosis not present

## 2015-05-25 ENCOUNTER — Other Ambulatory Visit: Payer: Self-pay | Admitting: Nurse Practitioner

## 2015-05-25 ENCOUNTER — Ambulatory Visit (INDEPENDENT_AMBULATORY_CARE_PROVIDER_SITE_OTHER): Payer: Medicare Other

## 2015-05-25 ENCOUNTER — Ambulatory Visit (INDEPENDENT_AMBULATORY_CARE_PROVIDER_SITE_OTHER): Payer: Medicare Other | Admitting: Nurse Practitioner

## 2015-05-25 VITALS — BP 131/75 | HR 84 | Temp 98.0°F | Ht 67.0 in | Wt 249.0 lb

## 2015-05-25 DIAGNOSIS — I509 Heart failure, unspecified: Secondary | ICD-10-CM | POA: Diagnosis not present

## 2015-05-25 DIAGNOSIS — R0989 Other specified symptoms and signs involving the circulatory and respiratory systems: Secondary | ICD-10-CM | POA: Diagnosis not present

## 2015-05-25 DIAGNOSIS — J90 Pleural effusion, not elsewhere classified: Secondary | ICD-10-CM

## 2015-05-25 MED ORDER — FUROSEMIDE 20 MG PO TABS: 40.0000 mg | ORAL_TABLET | Freq: Every day | ORAL | Status: DC

## 2015-05-25 MED ORDER — AZITHROMYCIN 250 MG PO TABS: ORAL_TABLET | ORAL | Status: DC

## 2015-05-25 NOTE — Patient Instructions (Signed)
Pleural Effusion °A pleural effusion is an abnormal buildup of fluid in the layers of tissue between your lungs and the inside of your chest (pleural space). These two layers of tissue that line both your lungs and the inside of your chest are called pleura. Usually, there is no air in the space between the pleura, only a thin layer of fluid. If left untreated, a large amount of fluid can build up and cause the lung to collapse. A pleural effusion is usually caused by another disease that requires treatment. °The two main types of pleural effusion are: °· Transudative pleural effusion. This happens when fluid leaks into the pleural space because of a low protein count in your blood or high blood pressure in your vessels. Heart failure often causes this. °· Exudative infusion. This occurs when fluid collects in the pleural space from blocked blood vessels or lymph vessels. Some lung diseases, injuries, and cancers can cause this type of effusion. °CAUSES °Pleural effusion can be caused by: °· Heart failure. °· A blood clot in the lung (pulmonary embolism). °· Pneumonia. °· Cancer. °· Liver failure (cirrhosis). °· Kidney disease. °· Complications from surgery, such as from open heart surgery. °SIGNS AND SYMPTOMS °In some cases, pleural effusion may cause no symptoms. Symptoms can include: °· Shortness of breath, especially when lying down. °· Chest pain, often worse when taking a deep breath. °· Fever. °· Dry cough that is lasting (chronic). °· Hiccups. °· Rapid breathing. °An underlying condition that is causing the pleural effusion (such as heart failure, pneumonia, blood clots, tuberculosis, or cancer) may also cause additional symptoms. °DIAGNOSIS °Your health care provider may suspect pleural effusion based on your symptoms and medical history. Your health care provider will also do a physical exam and a chest X-ray. If the X-ray shows there is fluid in your chest, you may need to have this fluid removed using a  needle (thoracentesis) so it can be tested. °You may also have: °· Imaging studies of the chest, such as: °¨ Ultrasound. °¨ CT scan. °· Blood tests for kidney and liver function. °TREATMENT °Treatment depends on the cause of the pleural effusion. Treatment may include: °· Taking antibiotic medicines to clear up an infection that is causing the pleural effusion. °· Placing a tube in the chest to drain the effusion (tube thoracostomy). This procedure is often used when there is an infection in the fluid. °· Surgery to remove the fibrous outer layer of tissue from the pleural space (decortication). °· Thoracentesis, which can improve cough and shortness of breath. °· A procedure to put medicine into the chest cavity to seal the pleural space to prevent fluid buildup (pleurodesis). °· Chemotherapy and radiation therapy. These may be required in the case of cancerous (malignant) pleural effusion. °HOME CARE INSTRUCTIONS °· Take medicines only as directed by your health care provider. °· Keep track of how long you can gently exercise before you get short of breath. Try simply walking at first. °· Do not use any tobacco products, including cigarettes, chewing tobacco, or electronic cigarettes. If you need help quitting, ask your health care provider. °· Keep all follow-up visits as directed by your health care provider. This is important. °SEEK MEDICAL CARE IF: °· The amount of time that you are able to exercise decreases or does not improve with time. °· You have pain or signs of infection at the puncture site if you had thoracentesis. Watch for: °¨ Drainage. °¨ Redness. °¨ Swelling. °· You have a fever. °  SEEK IMMEDIATE MEDICAL CARE IF: °· You are short of breath. °· You develop chest pain. °· You develop a new cough. °MAKE SURE YOU: °· Understand these instructions. °· Will watch your condition. °· Will get help right away if you are not doing well or get worse. °  °This information is not intended to replace advice  given to you by your health care provider. Make sure you discuss any questions you have with your health care provider. °  °Document Released: 05/08/2005 Document Revised: 05/29/2014 Document Reviewed: 10/01/2013 °Elsevier Interactive Patient Education ©2016 [employer] ° °

## 2015-05-25 NOTE — Progress Notes (Signed)
  Subjective:     Edward Heath is a 70 y.o. male here for evaluation of a cough. Onset of symptoms was 2 days ago. Symptoms have been gradually worsening since that time. The cough is barky, productive and raspy and is aggravated by nothing. Associated symptoms include: change in voice, night sweats, postnasal drip and sputum production. Patient does not have a history of asthma. Patient does not have a history of environmental allergens. Patient has not traveled recently. Patient does not have a history of smoking. Patient has had a previous chest x-ray. Patient has not had a PPD done.  The following portions of the patient's history were reviewed and updated as appropriate: allergies, current medications, past family history, past medical history, past social history, past surgical history and problem list.  Review of Systems Pertinent items are noted in HPI.    Objective:     BP 131/75 mmHg  Pulse 84  Temp(Src) 98 F (36.7 C) (Oral)  Ht 5\' 7"  (1.702 m)  Wt 249 lb (112.946 kg)  BMI 38.99 kg/m2  SpO2 95% General appearance: alert and cooperative Eyes: conjunctivae/corneas clear. PERRL, EOM's intact. Fundi benign. Ears: normal TM's and external ear canals both ears Nose: clear discharge, moderate congestion, turbinates pale Throat: lips, mucosa, and tongue normal; teeth and gums normal Lungs: rales bibasilar Heart: regular rate and rhythm, S1, S2 normal, no murmur, click, rub or gallop    Assessment:   CHF Pleural effusion  Plan:  Increase lasix form 20mg  to 40 mg daily Limit fluid intact ELevate legs when sitting Daily weights and if not going down let me know RTO prn Meds ordered this encounter  Medications  . furosemide (LASIX) 20 MG tablet    Sig: Take 2 tablets (40 mg total) by mouth daily.    Dispense:  60 tablet    Refill:  2    Order Specific Question:  Supervising Provider    Answer:  Chipper Herb [[id number]]  . azithromycin (ZITHROMAX Z-PAK) 250 MG tablet   Sig: As directed    Dispense:  1 each    Refill:  0    Order Specific Question:  Supervising Provider    Answer:  Chipper Herb [[id number]]    Ivor, FNP

## 2015-06-07 ENCOUNTER — Encounter: Payer: Self-pay | Admitting: Family Medicine

## 2015-06-07 ENCOUNTER — Ambulatory Visit (INDEPENDENT_AMBULATORY_CARE_PROVIDER_SITE_OTHER): Payer: Medicare Other | Admitting: Family Medicine

## 2015-06-07 VITALS — BP 118/73 | HR 76 | Temp 99.0°F | Ht 67.0 in | Wt 240.0 lb

## 2015-06-07 DIAGNOSIS — M545 Low back pain, unspecified: Secondary | ICD-10-CM

## 2015-06-07 MED ORDER — CELECOXIB 200 MG PO CAPS: 200.0000 mg | ORAL_CAPSULE | Freq: Every day | ORAL | Status: DC

## 2015-06-07 MED ORDER — CYCLOBENZAPRINE HCL 10 MG PO TABS: 10.0000 mg | ORAL_TABLET | Freq: Three times a day (TID) | ORAL | Status: DC | PRN

## 2015-06-07 NOTE — Progress Notes (Signed)
Subjective:  Patient ID: Edward Heath, male    DOB: 03-Dec-1945  Age: 70 y.o. MRN: [id number]  CC: Back Pain   HPI [name] presents for onset of right lower back pain. He was doing some lifting chores at home 2-3 days ago. Felt okay at the end but that night and the next day he developed increasing low back pain. Currently it is moderate at 5-6/10. It is isolated to the right lower back. It does prevent him from comfortably bending and lifting and moving from seated to standing position. It is most comfortable when laying down. However if he lays down for an extended period of time it gets very stiff. No radiation into the leg is noted.  History Isah has a past medical history of Hypertension; Stroke ([place]); Hyperlipidemia; GERD (gastroesophageal reflux disease); Skin cancer (06/2014); TIA (transient ischemic attack) (2009); Heart attack ([place]) (2004, 2015); and Lumbar vertebral fracture ([place]) (2012).   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   His family history includes Aneurysm in his father; Heart attack in his mother; Heart attack (age of onset: 48) in his brother.He reports that he has never smoked. His smokeless tobacco use includes Chew. He reports that he drinks alcohol. He reports that he does not use illicit drugs.  Outpatient Prescriptions Prior to Visit  Medication Sig Dispense Refill  . aspirin EC 81 MG tablet Take 81 mg by mouth.    Marland Kitchen atorvastatin (LIPITOR) 40 MG tablet Take 40 mg by mouth daily.      . Cholecalciferol (VITAMIN D-1000 MAX ST) 1000 UNITS tablet Take 1,000 Units by mouth.    . clopidogrel (PLAVIX) 75 MG tablet Take 75 mg by mouth daily.      Marland Kitchen docusate (COLACE) 60 MG/15ML syrup Take 60 mg by mouth daily.      . furosemide (LASIX) 20 MG tablet Take 2 tablets (40 mg total) by mouth daily. (Patient taking differently: Take 40 mg by mouth daily. Patient taking  differently.  Takes one pill daily.) 60 tablet 2  . gabapentin (NEURONTIN) 300 MG capsule Take 300 mg by mouth 2 (two) times daily.    . Ginseng 100 MG CAPS Take 1 capsule by mouth 2 (two) times daily.     Marland Kitchen glipiZIDE (GLUCOTROL) 5 MG tablet Take 2.5 mg by mouth 2 (two) times daily before a meal.     . lisinopril (PRINIVIL,ZESTRIL) 5 MG tablet Take 5 mg by mouth daily.      . Magnesium 250 MG TABS Take 500 mg by mouth 2 (two) times daily.     . metFORMIN (GLUCOPHAGE) 1000 MG tablet Take 1,000 mg by mouth 2 (two) times daily with a meal.     . metoprolol tartrate (LOPRESSOR) 25 MG tablet Take 25 mg by mouth 2 (two) times daily.     . Omega-3 1000 MG CAPS Take 2 g by mouth.    Marland Kitchen omeprazole (PRILOSEC) 10 MG capsule Take 20 mg by mouth 2 (two) times daily.     . Potassium 95 MG TBCR Take 1 tablet by mouth.     . vitamin B-12 (CYANOCOBALAMIN) 1000 MCG tablet Take 2,500 mcg by mouth.     . vitamin E (E-400) 400 UNIT capsule Take 400 Units by mouth.    . nitroGLYCERIN (NITROSTAT) 0.4 MG SL tablet Place 0.4 mg under the tongue.    Marland Kitchen azithromycin (ZITHROMAX Z-PAK) 250 MG tablet  As directed 1 each 0  . HYDROcodone-homatropine (HYCODAN) 5-1.5 MG/5ML syrup TAKE ONE TEASPOONFUL BY MOUTH EVERY SIX HOURS AS NEEDED FOR COUGH 120 mL 0   No facility-administered medications prior to visit.    ROS Review of Systems  Constitutional: Negative for fever, chills and diaphoresis.  HENT: Negative for rhinorrhea and sore throat.   Respiratory: Negative for cough and shortness of breath.   Cardiovascular: Negative for chest pain.  Gastrointestinal: Negative for abdominal pain.  Musculoskeletal: Positive for myalgias and back pain. Negative for arthralgias.  Skin: Negative for rash.  Neurological: Negative for weakness and headaches.    Objective:  BP 118/73 mmHg  Pulse 76  Temp(Src) 99 F (37.2 C) (Oral)  Ht 5\' 7"  (1.702 m)  Wt 240 lb (108.863 kg)  BMI 37.58 kg/m2  BP Readings from Last 3 Encounters:    06/07/15 118/73  05/25/15 131/75  03/01/15 117/74    Wt Readings from Last 3 Encounters:  06/07/15 240 lb (108.863 kg)  05/25/15 249 lb (112.946 kg)  03/01/15 249 lb 9.6 oz (113.218 kg)     Physical Exam  Constitutional: He is oriented to person, place, and time. He appears well-developed and well-nourished.  HENT:  Head: Normocephalic and atraumatic.  Right Ear: Tympanic membrane and external ear normal. No decreased hearing is noted.  Left Ear: Tympanic membrane and external ear normal. No decreased hearing is noted.  Mouth/Throat: No oropharyngeal exudate or posterior oropharyngeal erythema.  Eyes: Pupils are equal, round, and reactive to light.  Neck: Normal range of motion. Neck supple.  Cardiovascular: Normal rate and regular rhythm.   No murmur heard. Pulmonary/Chest: Breath sounds normal. No respiratory distress.  Abdominal: Soft. Bowel sounds are normal. He exhibits no mass. There is no tenderness.  Musculoskeletal: He exhibits tenderness.       Arms: Moderate tenderness at palpable spasm. Discrete area of 2X3 cm  Neurological: He is alert and oriented to person, place, and time.  Vitals reviewed.    No results found for: WBC, HGB, HCT, PLT, GLUCOSE, CHOL, TRIG, HDL, LDLDIRECT, LDLCALC, ALT, AST, NA, K, CL, CREATININE, BUN, CO2, TSH, PSA, INR, GLUF, HGBA1C, MICROALBUR  No results found.  Assessment & Plan:   There are no diagnoses linked to this encounter. I have discontinued Mr. Marinos's HYDROcodone-homatropine and azithromycin. I am also having him start on celecoxib and cyclobenzaprine. Additionally, I am having him maintain his omeprazole, metoprolol tartrate, lisinopril, docusate, clopidogrel, atorvastatin, aspirin EC, Cholecalciferol, vitamin B-12, Ginseng, glipiZIDE, Magnesium, metFORMIN, nitroGLYCERIN, Omega-3, Potassium, vitamin E, gabapentin, and furosemide.  Meds ordered this encounter  Medications  . celecoxib (CELEBREX) 200 MG capsule    Sig: Take  1 capsule (200 mg total) by mouth daily. With food    Dispense:  30 capsule    Refill:  5  . cyclobenzaprine (FLEXERIL) 10 MG tablet    Sig: Take 1 tablet (10 mg total) by mouth 3 (three) times daily as needed for muscle spasms.    Dispense:  90 tablet    Refill:  1     Follow-up: Return in about 2 weeks (around 06/21/2015), or if symptoms worsen or fail to improve.  Claretta Fraise, M.D.

## 2015-06-07 NOTE — Patient Instructions (Signed)

## 2015-06-11 DIAGNOSIS — Z8781 Personal history of (healed) traumatic fracture: Secondary | ICD-10-CM | POA: Diagnosis not present

## 2015-06-11 DIAGNOSIS — R072 Precordial pain: Secondary | ICD-10-CM | POA: Diagnosis not present

## 2015-06-11 DIAGNOSIS — Z7984 Long term (current) use of oral hypoglycemic drugs: Secondary | ICD-10-CM | POA: Diagnosis not present

## 2015-06-11 DIAGNOSIS — F17220 Nicotine dependence, chewing tobacco, uncomplicated: Secondary | ICD-10-CM | POA: Diagnosis present

## 2015-06-11 DIAGNOSIS — K449 Diaphragmatic hernia without obstruction or gangrene: Secondary | ICD-10-CM | POA: Diagnosis present

## 2015-06-11 DIAGNOSIS — I509 Heart failure, unspecified: Secondary | ICD-10-CM | POA: Diagnosis not present

## 2015-06-11 DIAGNOSIS — Z955 Presence of coronary angioplasty implant and graft: Secondary | ICD-10-CM | POA: Diagnosis not present

## 2015-06-11 DIAGNOSIS — R000 Tachycardia, unspecified: Secondary | ICD-10-CM | POA: Diagnosis not present

## 2015-06-11 DIAGNOSIS — I2510 Atherosclerotic heart disease of native coronary artery without angina pectoris: Secondary | ICD-10-CM | POA: Diagnosis not present

## 2015-06-11 DIAGNOSIS — I4891 Unspecified atrial fibrillation: Secondary | ICD-10-CM | POA: Diagnosis not present

## 2015-06-11 DIAGNOSIS — I5023 Acute on chronic systolic (congestive) heart failure: Secondary | ICD-10-CM | POA: Diagnosis present

## 2015-06-11 DIAGNOSIS — M791 Myalgia: Secondary | ICD-10-CM | POA: Diagnosis present

## 2015-06-11 DIAGNOSIS — K219 Gastro-esophageal reflux disease without esophagitis: Secondary | ICD-10-CM | POA: Diagnosis not present

## 2015-06-11 DIAGNOSIS — I255 Ischemic cardiomyopathy: Secondary | ICD-10-CM | POA: Diagnosis present

## 2015-06-11 DIAGNOSIS — Z049 Encounter for examination and observation for unspecified reason: Secondary | ICD-10-CM | POA: Diagnosis not present

## 2015-06-11 DIAGNOSIS — I110 Hypertensive heart disease with heart failure: Secondary | ICD-10-CM | POA: Diagnosis present

## 2015-06-11 DIAGNOSIS — Z8673 Personal history of transient ischemic attack (TIA), and cerebral infarction without residual deficits: Secondary | ICD-10-CM | POA: Diagnosis not present

## 2015-06-11 DIAGNOSIS — Z7982 Long term (current) use of aspirin: Secondary | ICD-10-CM | POA: Diagnosis not present

## 2015-06-11 DIAGNOSIS — Z7952 Long term (current) use of systemic steroids: Secondary | ICD-10-CM | POA: Diagnosis not present

## 2015-06-11 DIAGNOSIS — L821 Other seborrheic keratosis: Secondary | ICD-10-CM | POA: Diagnosis present

## 2015-06-11 DIAGNOSIS — I447 Left bundle-branch block, unspecified: Secondary | ICD-10-CM | POA: Diagnosis not present

## 2015-06-11 DIAGNOSIS — E785 Hyperlipidemia, unspecified: Secondary | ICD-10-CM | POA: Diagnosis present

## 2015-06-11 DIAGNOSIS — Z8711 Personal history of peptic ulcer disease: Secondary | ICD-10-CM | POA: Diagnosis not present

## 2015-06-11 DIAGNOSIS — Z95810 Presence of automatic (implantable) cardiac defibrillator: Secondary | ICD-10-CM | POA: Diagnosis not present

## 2015-06-11 DIAGNOSIS — Z8679 Personal history of other diseases of the circulatory system: Secondary | ICD-10-CM | POA: Diagnosis not present

## 2015-06-11 DIAGNOSIS — R079 Chest pain, unspecified: Secondary | ICD-10-CM | POA: Diagnosis not present

## 2015-06-11 DIAGNOSIS — R748 Abnormal levels of other serum enzymes: Secondary | ICD-10-CM | POA: Diagnosis not present

## 2015-06-11 DIAGNOSIS — E119 Type 2 diabetes mellitus without complications: Secondary | ICD-10-CM | POA: Diagnosis present

## 2015-06-11 DIAGNOSIS — J9601 Acute respiratory failure with hypoxia: Secondary | ICD-10-CM | POA: Diagnosis not present

## 2015-06-11 DIAGNOSIS — Z7902 Long term (current) use of antithrombotics/antiplatelets: Secondary | ICD-10-CM | POA: Diagnosis not present

## 2015-06-11 DIAGNOSIS — I252 Old myocardial infarction: Secondary | ICD-10-CM | POA: Diagnosis not present

## 2015-06-11 DIAGNOSIS — K2270 Barrett's esophagus without dysplasia: Secondary | ICD-10-CM | POA: Diagnosis present

## 2015-06-11 DIAGNOSIS — Z87442 Personal history of urinary calculi: Secondary | ICD-10-CM | POA: Diagnosis not present

## 2015-06-11 DIAGNOSIS — I481 Persistent atrial fibrillation: Secondary | ICD-10-CM | POA: Diagnosis present

## 2015-06-18 ENCOUNTER — Telehealth: Payer: Self-pay | Admitting: Family Medicine

## 2015-06-21 ENCOUNTER — Other Ambulatory Visit: Payer: Self-pay | Admitting: Family Medicine

## 2015-06-21 MED ORDER — METOPROLOL SUCCINATE ER 100 MG PO TB24
100.0000 mg | ORAL_TABLET | Freq: Every day | ORAL | Status: DC
Start: 2015-06-21 — End: 2022-01-17

## 2015-06-21 MED ORDER — AMIODARONE HCL 200 MG PO TABS: 200.0000 mg | ORAL_TABLET | Freq: Every day | ORAL | Status: DC

## 2015-06-21 MED ORDER — CHOLECALCIFEROL 25 MCG (1000 UT) PO TABS: 1000.0000 [IU] | ORAL_TABLET | Freq: Two times a day (BID) | ORAL | Status: DC

## 2015-06-21 MED ORDER — APIXABAN 5 MG PO TABS: 5.0000 mg | ORAL_TABLET | Freq: Two times a day (BID) | ORAL | Status: AC

## 2015-06-22 NOTE — Telephone Encounter (Signed)
I don't see any records in [facility]. Patient must have been seen outside of [facility]. I called and left a message at his home for them to return my call with where he was hospitalized, what he is needing to be referred for and if he has a preference for where his cardiologist is located.  Do you know what diagnosis he needs to see cardiology for?

## 2015-06-22 NOTE — Telephone Encounter (Signed)
His wife brought in a med list from [facility]. It doesn't list a dx, but he was started on amiodarone which is almost always for control of atrial fib. He also had an appt. With their device clinic on Jan. 23, plus other cardiology follow up. The info I had is in my out box on my desk with your name on it.

## 2015-06-22 NOTE — Telephone Encounter (Signed)
I am aware of his situation, have seen the report. Please refer as requested. Thanks, WS

## 2015-06-23 DIAGNOSIS — I4891 Unspecified atrial fibrillation: Secondary | ICD-10-CM | POA: Diagnosis not present

## 2015-06-23 NOTE — Telephone Encounter (Signed)
Current medication list reviewed and appropriate changes made in EMR.  Records updated in [facility] and records from [facility] are available for review. He was seen by cardiologist Carlis Stable, MD today for a stress test.  It doesn't appear that he needs [place] to arrange an appointment for him through the referral process.  I question whether his wife was referring to a referral for the insurance company's benefit so that they will cover the visit.  He has upcoming appts with neurology, dermatology and cardiology within the next month or two.  A message was left for his wife yesterday. Will wait to hear from her before proceeding with the referral.

## 2015-07-14 DIAGNOSIS — I110 Hypertensive heart disease with heart failure: Secondary | ICD-10-CM | POA: Diagnosis not present

## 2015-07-14 DIAGNOSIS — I509 Heart failure, unspecified: Secondary | ICD-10-CM | POA: Diagnosis not present

## 2015-07-14 DIAGNOSIS — Z951 Presence of aortocoronary bypass graft: Secondary | ICD-10-CM | POA: Diagnosis not present

## 2015-07-14 DIAGNOSIS — Z955 Presence of coronary angioplasty implant and graft: Secondary | ICD-10-CM | POA: Diagnosis not present

## 2015-07-14 DIAGNOSIS — E785 Hyperlipidemia, unspecified: Secondary | ICD-10-CM | POA: Diagnosis not present

## 2015-07-14 DIAGNOSIS — I2510 Atherosclerotic heart disease of native coronary artery without angina pectoris: Secondary | ICD-10-CM | POA: Diagnosis not present

## 2015-07-14 DIAGNOSIS — I4891 Unspecified atrial fibrillation: Secondary | ICD-10-CM | POA: Diagnosis not present

## 2015-07-14 DIAGNOSIS — I429 Cardiomyopathy, unspecified: Secondary | ICD-10-CM | POA: Diagnosis not present

## 2015-07-14 DIAGNOSIS — Z7982 Long term (current) use of aspirin: Secondary | ICD-10-CM | POA: Diagnosis not present

## 2015-07-14 DIAGNOSIS — F17220 Nicotine dependence, chewing tobacco, uncomplicated: Secondary | ICD-10-CM | POA: Diagnosis not present

## 2015-07-14 DIAGNOSIS — E119 Type 2 diabetes mellitus without complications: Secondary | ICD-10-CM | POA: Diagnosis not present

## 2015-07-14 DIAGNOSIS — Z95810 Presence of automatic (implantable) cardiac defibrillator: Secondary | ICD-10-CM | POA: Diagnosis not present

## 2015-07-14 DIAGNOSIS — Z79899 Other long term (current) drug therapy: Secondary | ICD-10-CM | POA: Diagnosis not present

## 2015-07-14 DIAGNOSIS — Z7901 Long term (current) use of anticoagulants: Secondary | ICD-10-CM | POA: Diagnosis not present

## 2015-07-14 DIAGNOSIS — Z8673 Personal history of transient ischemic attack (TIA), and cerebral infarction without residual deficits: Secondary | ICD-10-CM | POA: Diagnosis not present

## 2015-07-14 DIAGNOSIS — Z7984 Long term (current) use of oral hypoglycemic drugs: Secondary | ICD-10-CM | POA: Diagnosis not present

## 2015-08-16 LAB — HEMOGLOBIN A1C: Hemoglobin A1C: 7.2

## 2015-08-16 LAB — HEPATIC FUNCTION PANEL
ALT: 23 U/L (ref 10–40)
AST: 30 U/L (ref 14–40)
Alkaline Phosphatase: 72 U/L (ref 25–125)
Bilirubin, Total: 0.4 mg/dL

## 2015-08-16 LAB — BASIC METABOLIC PANEL
BUN: 11 mg/dL (ref 4–21)
Creatinine: 0.9 mg/dL (ref 0.6–1.3)
Glucose: 149 mg/dL
Potassium: 4.4 mmol/L (ref 3.4–5.3)
Sodium: 138 mmol/L (ref 137–147)

## 2015-08-23 ENCOUNTER — Other Ambulatory Visit: Payer: Self-pay | Admitting: Nurse Practitioner

## 2015-08-24 ENCOUNTER — Encounter (HOSPITAL_COMMUNITY)
Admission: RE | Admit: 2015-08-24 | Discharge: 2015-08-24 | Disposition: A | Discharge status: Home / Self Care | Payer: Medicare Other | Source: Ambulatory Visit | Attending: Cardiology | Admitting: Cardiology

## 2015-08-24 ENCOUNTER — Encounter (HOSPITAL_COMMUNITY): Payer: Self-pay

## 2015-08-24 VITALS — BP 100/52 | HR 52 | Ht 70.0 in | Wt 242.5 lb

## 2015-08-24 DIAGNOSIS — K219 Gastro-esophageal reflux disease without esophagitis: Secondary | ICD-10-CM | POA: Insufficient documentation

## 2015-08-24 DIAGNOSIS — I110 Hypertensive heart disease with heart failure: Secondary | ICD-10-CM | POA: Diagnosis not present

## 2015-08-24 DIAGNOSIS — E785 Hyperlipidemia, unspecified: Secondary | ICD-10-CM | POA: Insufficient documentation

## 2015-08-24 DIAGNOSIS — I5022 Chronic systolic (congestive) heart failure: Secondary | ICD-10-CM

## 2015-08-24 DIAGNOSIS — Z8673 Personal history of transient ischemic attack (TIA), and cerebral infarction without residual deficits: Secondary | ICD-10-CM | POA: Diagnosis not present

## 2015-08-24 DIAGNOSIS — Z79899 Other long term (current) drug therapy: Secondary | ICD-10-CM | POA: Insufficient documentation

## 2015-08-24 DIAGNOSIS — I252 Old myocardial infarction: Secondary | ICD-10-CM | POA: Insufficient documentation

## 2015-08-24 NOTE — Progress Notes (Signed)
Cardiac/Pulmonary Rehab Medication Review by a Pharmacist  Does the patient  feel that his/her medications are working for him/her?  yes  Has the patient been experiencing any side effects to the medications prescribed?  no  Does the patient measure his/her own blood pressure or blood glucose at home?  yes   Does the patient have any problems obtaining medications due to transportation or finances?   no  Understanding of regimen: good Understanding of indications: good Potential of compliance: good  Questions asked to Determine Patient Understanding of Medication Regimen:  1. What is the name of the medication?  2. What is the medication used for?  3. When should it be taken?  4. How much should be taken?  5. How will you take it?  6. What side effects should you report?  Understanding Defined as: Excellent: All questions above are correct Good: Questions 1-4 are correct Fair: Questions 1-2 are correct  Poor: 1 or none of the above questions are correct   Pharmacist comments: Pt is not c/o any side effects.  Pt does check BP and blood sugar at home.  Pt gets some of his meds from the [place].  Some meds were adjusted on PTA list due to recent changes.  Hart Robinsons A 08/24/2015 3:00 PM

## 2015-08-24 NOTE — Progress Notes (Signed)
Cardiac Individual Treatment Plan  Patient Details  Name: Edward Heath MRN: [id number] Date of Birth: [birth year] Referring Provider:  Eileen Stanford, MD  Initial Encounter Date:       CARDIAC REHAB PHASE II ORIENTATION from 08/24/2015 in [place]   Date  08/24/15      Visit Diagnosis: Chronic systolic congestive heart failure ([place])  Patient's Home Medications on Admission:  Current outpatient prescriptions:  .  Salicylic Acid 2 % CREA, Apply 1 application topically 2 (two) times daily as needed., Disp: , Rfl:  .  amiodarone (PACERONE) 200 MG tablet, Take 1 tablet (200 mg total) by mouth daily., Disp: 30 tablet, Rfl: 0 .  apixaban (ELIQUIS) 5 MG TABS tablet, Take 1 tablet (5 mg total) by mouth 2 (two) times daily. 2 BID X 7 days then 1 BID, Disp: 75 tablet, Rfl: 0 .  aspirin EC 81 MG tablet, Take 81 mg by mouth., Disp: , Rfl:  .  atorvastatin (LIPITOR) 40 MG tablet, Take 40 mg by mouth daily.  , Disp: , Rfl:  .  celecoxib (CELEBREX) 200 MG capsule, Take 1 capsule (200 mg total) by mouth daily. With food (Patient taking differently: Take 200 mg by mouth daily as needed for moderate pain (Pt takes several times per week per MD instructions but not daily). With food), Disp: 30 capsule, Rfl: 5 .  Cholecalciferol (VITAMIN D-1000 MAX ST) 1000 units tablet, Take 1 tablet (1,000 Units total) by mouth 2 (two) times daily., Disp: 60 tablet, Rfl: 11 .  cyanocobalamin 1000 MCG tablet, Take 1,000 mcg by mouth daily., Disp: , Rfl:  .  docusate (COLACE) 60 MG/15ML syrup, Take 60 mg by mouth daily.  , Disp: , Rfl:  .  ferrous sulfate 325 (65 FE) MG tablet, Take 325 mg by mouth., Disp: , Rfl:  .  furosemide (LASIX) 20 MG tablet, TAKE (2) TABLETS DAILY (Patient taking differently: take 1 (20mg ) tablet daily), Disp: 60 tablet, Rfl: 2 .  gabapentin (NEURONTIN) 300 MG capsule, Take 300 mg by mouth 2 (two) times daily., Disp: , Rfl:  .  glipiZIDE (GLUCOTROL) 5 MG tablet, Take 5 mg  by mouth daily before breakfast. , Disp: , Rfl:  .  lisinopril (PRINIVIL,ZESTRIL) 5 MG tablet, Take 5 mg by mouth daily.  , Disp: , Rfl:  .  Magnesium 250 MG TABS, Take 500 mg by mouth 2 (two) times daily. , Disp: , Rfl:  .  metFORMIN (GLUCOPHAGE) 1000 MG tablet, Take 1,000 mg by mouth 2 (two) times daily with a meal. , Disp: , Rfl:  .  metoprolol succinate (TOPROL-XL) 100 MG 24 hr tablet, Take 1 tablet (100 mg total) by mouth daily. Take with or immediately following a meal., Disp: 90 tablet, Rfl: 0 .  nitroGLYCERIN (NITROSTAT) 0.4 MG SL tablet, Place 0.4 mg under the tongue., Disp: , Rfl:  .  Omega-3 1000 MG CAPS, Take 2 g by mouth 2 (two) times daily. , Disp: , Rfl:  .  omeprazole (PRILOSEC) 10 MG capsule, Take 20 mg by mouth 2 (two) times daily. , Disp: , Rfl:  .  Potassium 95 MG TBCR, Take 1 tablet by mouth 2 (two) times daily. , Disp: , Rfl:  .  traMADol (ULTRAM) 50 MG tablet, Take 50 mg by mouth 2 (two) times daily., Disp: , Rfl:  .  vitamin E (E-400) 400 UNIT capsule, Take 400 Units by mouth., Disp: , Rfl:   Past Medical History: Past Medical History  Diagnosis  Date  . Hypertension   . Stroke ([place])   . Hyperlipidemia   . GERD (gastroesophageal reflux disease)   . Skin cancer 06/2014    left cheek  . TIA (transient ischemic attack) 2009  . Heart attack ([place]) 2004, 2015  . Lumbar vertebral fracture ([place]) 2012    Tobacco Use: History  Smoking status  . Never Smoker   Smokeless tobacco  . Current User  . Types: Chew    Labs:     Recent Review Flowsheet Data    There is no flowsheet data to display.      Capillary Blood Glucose: No results found for: GLUCAP   Exercise Target Goals: Date: 08/24/15  Exercise Program Goal: Individual exercise prescription set with THRR, safety & activity barriers. Participant demonstrates ability to understand and report RPE using BORG scale, to self-measure pulse accurately, and to acknowledge the importance of the exercise  prescription.  Exercise Prescription Goal: Starting with aerobic activity 30 plus minutes a day, 3 days per week for initial exercise prescription. Provide home exercise prescription and guidelines that participant acknowledges understanding prior to discharge.  Activity Barriers & Risk Stratification:     Activity Barriers & Cardiac Risk Stratification - 08/24/15 1715    Activity Barriers & Cardiac Risk Stratification   Activity Barriers None   Cardiac Risk Stratification High      6 Minute Walk:     6 Minute Walk      08/24/15 1553       6 Minute Walk   Phase Initial     Distance 1150 feet     Walk Time 6 minutes     # of Rest Breaks 0     MPH 2.18     METS 2.67     RPE 11     Perceived Dyspnea  9     VO2 Peak 6.78     Symptoms No     Resting HR 52 bpm     Resting BP 100/52 mmHg     Max Ex. HR 75 bpm     Max Ex. BP 122/64 mmHg     2 Minute Post BP 98/58 mmHg        Initial Exercise Prescription:     Initial Exercise Prescription - 08/24/15 1600    Date of Initial Exercise Prescription   Date 08/24/15   Treadmill   MPH 1.2   Grade 0   Minutes 15   METs 1.92   NuStep   Level 2   Minutes 15   METs 1.5   Prescription Details   Frequency (times per week) 3   Intensity   THRR REST +  30   THRR 40-80% of Max Heartrate 91-130   Ratings of Perceived Exertion 11-13   Progression   Progression Continue to progress workloads to maintain intensity without signs/symptoms of physical distress.   Resistance Training   Training Prescription Yes   Weight 1   Reps 10-12      Perform Capillary Blood Glucose checks as needed.  Exercise Prescription Changes:   Exercise Comments:    Discharge Exercise Prescription (Final Exercise Prescription Changes):   Nutrition:  Target Goals: Understanding of nutrition guidelines, daily intake of sodium 1500mg , cholesterol 200mg , calories 30% from fat and 7% or less from saturated fats, daily to have 5 or more  servings of fruits and vegetables.  Biometrics:     Pre Biometrics - 08/25/15 1403    Pre Biometrics   Height 5'  10" (1.778 m)   Weight 242 lb 8 oz (109.997 kg)   Waist Circumference 45 inches   Hip Circumference 50 inches   Waist to Hip Ratio 0.9 %   BMI (Calculated) 34.9   Triceps Skinfold 15 mm   % Body Fat 32.3 %   Grip Strength 95 kg   Flexibility 0 in   Single Leg Stand 13 seconds       Nutrition Therapy Plan and Nutrition Goals:     Nutrition Therapy & Goals - 08/24/15 1717    Nutrition Therapy   Diet --  Regular diet   Intervention Plan   Intervention Prescribe, educate and counsel regarding individualized specific dietary modifications aiming towards targeted core components such as weight, hypertension, lipid management, diabetes, heart failure and other comorbidities.   Expected Outcomes Short Term Goal: Understand basic principles of dietary content, such as calories, fat, sodium, cholesterol and nutrients.;Long Term Goal: Adherence to prescribed nutrition plan.      Nutrition Discharge: Rate Your Plate Scores:     Nutrition Assessments - 08/24/15 1718    MEDFICTS Scores   Pre Score 25      Nutrition Goals Re-Evaluation:   Psychosocial: Target Goals: Acknowledge presence or absence of depression, maximize coping skills, provide positive support system. Participant is able to verbalize types and ability to use techniques and skills needed for reducing stress and depression.  Initial Review & Psychosocial Screening:     Initial Psych Review & Screening - 08/24/15 1723    Initial Review   Current issues with --  None   Family Dynamics   Good Support System? Yes   Barriers   Psychosocial barriers to participate in program There are no identifiable barriers or psychosocial needs.   Screening Interventions   Interventions Encouraged to exercise      Quality of Life Scores:     Quality of Life - 08/25/15 1405    Quality of Life Scores    Health/Function Pre 24.96 %   Socioeconomic Pre 27 %   Psych/Spiritual Pre 27.21 %   Family Pre 27 %   GLOBAL Pre 26.11 %      PHQ-9:     Recent Review Flowsheet Data    Depression screen [facility] 2/9 08/24/2015 05/25/2015 08/05/2014 10/16/2013   Decreased Interest 0 0 0 0   Down, Depressed, Hopeless 0 0 1 0   PHQ - 2 Score 0 0 1 0   Altered sleeping 0 - - -   Tired, decreased energy 0 - - -   Change in appetite 0 - - -   Feeling bad or failure about yourself  0 - - -   Trouble concentrating 0 - - -   Moving slowly or fidgety/restless 0 - - -   Suicidal thoughts 0 - - -   PHQ-9 Score 0 - - -   Difficult doing work/chores Not difficult at all - - -      Psychosocial Evaluation and Intervention:     Psychosocial Evaluation - 08/24/15 1723    Psychosocial Evaluation & Interventions   Comments Patient states he is not depressed and does not need counseling   Continued Psychosocial Services Needed No      Psychosocial Re-Evaluation:   Vocational Rehabilitation: Provide vocational rehab assistance to qualifying candidates.   Vocational Rehab Evaluation & Intervention:     Vocational Rehab - 08/24/15 1717    Initial Vocational Rehab Evaluation & Intervention   Assessment shows need for Vocational Rehabilitation  No      Education: Education Goals: Education classes will be provided on a weekly basis, covering required topics. Participant will state understanding/return demonstration of topics presented.  Learning Barriers/Preferences:     Learning Barriers/Preferences - 08/24/15 1715    Learning Barriers/Preferences   Learning Barriers None   Learning Preferences Group Instruction  Learns by doing      Education Topics: Hypertension, Hypertension Reduction -Define heart disease and high blood pressure. Discus how high blood pressure affects the body and ways to reduce high blood pressure.   Exercise and Your Heart -Discuss why it is important to exercise, the  FITT principles of exercise, normal and abnormal responses to exercise, and how to exercise safely.   Angina -Discuss definition of angina, causes of angina, treatment of angina, and how to decrease risk of having angina.   Cardiac Medications -Review what the following cardiac medications are used for, how they affect the body, and side effects that may occur when taking the medications.  Medications include Aspirin, Beta blockers, calcium channel blockers, ACE Inhibitors, angiotensin receptor blockers, diuretics, digoxin, and antihyperlipidemics.   Congestive Heart Failure -Discuss the definition of CHF, how to live with CHF, the signs and symptoms of CHF, and how keep track of weight and sodium intake.   Heart Disease and Intimacy -Discus the effect sexual activity has on the heart, how changes occur during intimacy as we age, and safety during sexual activity.   Smoking Cessation / COPD -Discuss different methods to quit smoking, the health benefits of quitting smoking, and the definition of COPD.   Nutrition I: Fats -Discuss the types of cholesterol, what cholesterol does to the heart, and how cholesterol levels can be controlled.   Nutrition II: Labels -Discuss the different components of food labels and how to read food label   Heart Parts and Heart Disease -Discuss the anatomy of the heart, the pathway of blood circulation through the heart, and these are affected by heart disease.   Stress I: Signs and Symptoms -Discuss the causes of stress, how stress may lead to anxiety and depression, and ways to limit stress.   Stress II: Relaxation -Discuss different types of relaxation techniques to limit stress.   Warning Signs of Stroke / TIA -Discuss definition of a stroke, what the signs and symptoms are of a stroke, and how to identify when someone is having stroke.   Knowledge Questionnaire Score:     Knowledge Questionnaire Score - 08/24/15 1716    Knowledge  Questionnaire Score   Pre Score 22/24      Core Components/Risk Factors/Patient Goals at Admission:     Personal Goals and Risk Factors at Admission - 08/24/15 1719    Core Components/Risk Factors/Patient Goals on Admission    Weight Management Yes   Admit Weight 242 lb 8 oz (109.997 kg)   Goal Weight: Short Term 237 lb 8 oz (107.729 kg)   Goal Weight: Long Term 235 lb (106.595 kg)   Expected Outcomes Short Term: Continue to assess and modify interventions until short term weight is achieved   Sedentary --  No, very activie   Increase Strength and Stamina Yes   Intervention Provide advice, education, support and counseling about physical activity/exercise needs.;Develop an individualized exercise prescription for aerobic and resistive training based on initial evaluation findings, risk stratification, comorbidities and participant's personal goals.   Tobacco Cessation --  None needed   Diabetes --  Patient states he already has dieabetes under control.  Personal Goal Other Yes   Personal Goal More strength   Intervention More stamina   Expected Outcomes Lose 5-7lbs, Be able to continue to play golf      Core Components/Risk Factors/Patient Goals Review:    Core Components/Risk Factors/Patient Goals at Discharge (Final Review):    ITP Comments:   Comments: Patient arrived for 1st visit/orientation/education at 2:30pm. Patient was referred to CR by Dr. Eileen Stanford due to Chronic Systolic CHF ([id number]).  During orientation advised patient on arrival and appointment times what to wear, what to do before, during and after exercise. Reviewed attendance and class policy. Talked about inclement weather and class consultation policy. Pt is scheduled to return Cardiac Rehab on 08/24/15 at 9:30pm. He will be coming on Wednesday and Friday at 3:45pm. Pt was advised to come to class 15 minutes before class starts. He was also given instructions on meeting with the dietician and  attending the Family Structure classes. Pt is eager to get started. Patient was able to complete 6 minute walk test. Patient was measured for the equipment. Discussed equipment safety with patient. Took patient pre-anthropometric measurements. Patient finished visit a 4:30pm.

## 2015-08-27 DIAGNOSIS — I110 Hypertensive heart disease with heart failure: Secondary | ICD-10-CM | POA: Diagnosis not present

## 2015-08-27 DIAGNOSIS — Z79899 Other long term (current) drug therapy: Secondary | ICD-10-CM | POA: Diagnosis not present

## 2015-08-27 DIAGNOSIS — E785 Hyperlipidemia, unspecified: Secondary | ICD-10-CM | POA: Diagnosis not present

## 2015-08-27 DIAGNOSIS — G252 Other specified forms of tremor: Secondary | ICD-10-CM | POA: Diagnosis not present

## 2015-08-27 DIAGNOSIS — I2510 Atherosclerotic heart disease of native coronary artery without angina pectoris: Secondary | ICD-10-CM | POA: Diagnosis not present

## 2015-08-27 DIAGNOSIS — Z7984 Long term (current) use of oral hypoglycemic drugs: Secondary | ICD-10-CM | POA: Diagnosis not present

## 2015-08-27 DIAGNOSIS — Z7901 Long term (current) use of anticoagulants: Secondary | ICD-10-CM | POA: Diagnosis not present

## 2015-08-27 DIAGNOSIS — I63312 Cerebral infarction due to thrombosis of left middle cerebral artery: Secondary | ICD-10-CM | POA: Diagnosis not present

## 2015-08-27 DIAGNOSIS — I998 Other disorder of circulatory system: Secondary | ICD-10-CM | POA: Diagnosis not present

## 2015-08-27 DIAGNOSIS — E119 Type 2 diabetes mellitus without complications: Secondary | ICD-10-CM | POA: Diagnosis not present

## 2015-08-27 DIAGNOSIS — Z8673 Personal history of transient ischemic attack (TIA), and cerebral infarction without residual deficits: Secondary | ICD-10-CM | POA: Diagnosis not present

## 2015-08-27 DIAGNOSIS — I509 Heart failure, unspecified: Secondary | ICD-10-CM | POA: Diagnosis not present

## 2015-08-27 DIAGNOSIS — Z7982 Long term (current) use of aspirin: Secondary | ICD-10-CM | POA: Diagnosis not present

## 2015-08-27 DIAGNOSIS — I252 Old myocardial infarction: Secondary | ICD-10-CM | POA: Diagnosis not present

## 2015-08-27 DIAGNOSIS — I4891 Unspecified atrial fibrillation: Secondary | ICD-10-CM | POA: Diagnosis not present

## 2015-08-30 ENCOUNTER — Encounter (HOSPITAL_COMMUNITY)
Admission: RE | Admit: 2015-08-30 | Discharge: 2015-08-30 | Disposition: A | Discharge status: Home / Self Care | Payer: Medicare Other | Source: Ambulatory Visit | Attending: Cardiology | Admitting: Cardiology

## 2015-08-30 DIAGNOSIS — Z8673 Personal history of transient ischemic attack (TIA), and cerebral infarction without residual deficits: Secondary | ICD-10-CM | POA: Diagnosis not present

## 2015-08-30 DIAGNOSIS — E785 Hyperlipidemia, unspecified: Secondary | ICD-10-CM | POA: Diagnosis not present

## 2015-08-30 DIAGNOSIS — K219 Gastro-esophageal reflux disease without esophagitis: Secondary | ICD-10-CM | POA: Diagnosis not present

## 2015-08-30 DIAGNOSIS — Z79899 Other long term (current) drug therapy: Secondary | ICD-10-CM | POA: Diagnosis not present

## 2015-08-30 DIAGNOSIS — I5022 Chronic systolic (congestive) heart failure: Secondary | ICD-10-CM | POA: Diagnosis not present

## 2015-08-30 DIAGNOSIS — I110 Hypertensive heart disease with heart failure: Secondary | ICD-10-CM | POA: Diagnosis not present

## 2015-09-01 ENCOUNTER — Encounter (HOSPITAL_COMMUNITY)
Admission: RE | Admit: 2015-09-01 | Discharge: 2015-09-01 | Disposition: A | Discharge status: Home / Self Care | Payer: Medicare Other | Source: Ambulatory Visit | Attending: Cardiology | Admitting: Cardiology

## 2015-09-01 DIAGNOSIS — Z8673 Personal history of transient ischemic attack (TIA), and cerebral infarction without residual deficits: Secondary | ICD-10-CM | POA: Diagnosis not present

## 2015-09-01 DIAGNOSIS — I110 Hypertensive heart disease with heart failure: Secondary | ICD-10-CM | POA: Diagnosis not present

## 2015-09-01 DIAGNOSIS — K219 Gastro-esophageal reflux disease without esophagitis: Secondary | ICD-10-CM | POA: Diagnosis not present

## 2015-09-01 DIAGNOSIS — Z79899 Other long term (current) drug therapy: Secondary | ICD-10-CM | POA: Diagnosis not present

## 2015-09-01 DIAGNOSIS — E785 Hyperlipidemia, unspecified: Secondary | ICD-10-CM | POA: Diagnosis not present

## 2015-09-01 DIAGNOSIS — I5022 Chronic systolic (congestive) heart failure: Secondary | ICD-10-CM | POA: Diagnosis not present

## 2015-09-03 ENCOUNTER — Encounter (HOSPITAL_COMMUNITY)
Admission: RE | Admit: 2015-09-03 | Discharge: 2015-09-03 | Disposition: A | Discharge status: Home / Self Care | Payer: Medicare Other | Source: Ambulatory Visit | Attending: Cardiology | Admitting: Cardiology

## 2015-09-03 DIAGNOSIS — E785 Hyperlipidemia, unspecified: Secondary | ICD-10-CM | POA: Diagnosis not present

## 2015-09-03 DIAGNOSIS — I5022 Chronic systolic (congestive) heart failure: Secondary | ICD-10-CM | POA: Diagnosis not present

## 2015-09-03 DIAGNOSIS — Z8673 Personal history of transient ischemic attack (TIA), and cerebral infarction without residual deficits: Secondary | ICD-10-CM | POA: Diagnosis not present

## 2015-09-03 DIAGNOSIS — K219 Gastro-esophageal reflux disease without esophagitis: Secondary | ICD-10-CM | POA: Diagnosis not present

## 2015-09-03 DIAGNOSIS — I110 Hypertensive heart disease with heart failure: Secondary | ICD-10-CM | POA: Diagnosis not present

## 2015-09-03 DIAGNOSIS — Z79899 Other long term (current) drug therapy: Secondary | ICD-10-CM | POA: Diagnosis not present

## 2015-09-06 ENCOUNTER — Encounter (HOSPITAL_COMMUNITY)
Admission: RE | Admit: 2015-09-06 | Discharge: 2015-09-06 | Disposition: A | Discharge status: Home / Self Care | Payer: Medicare Other | Source: Ambulatory Visit | Attending: Cardiology | Admitting: Cardiology

## 2015-09-06 DIAGNOSIS — K219 Gastro-esophageal reflux disease without esophagitis: Secondary | ICD-10-CM | POA: Diagnosis not present

## 2015-09-06 DIAGNOSIS — I5022 Chronic systolic (congestive) heart failure: Secondary | ICD-10-CM | POA: Diagnosis not present

## 2015-09-06 DIAGNOSIS — Z8673 Personal history of transient ischemic attack (TIA), and cerebral infarction without residual deficits: Secondary | ICD-10-CM | POA: Diagnosis not present

## 2015-09-06 DIAGNOSIS — E785 Hyperlipidemia, unspecified: Secondary | ICD-10-CM | POA: Diagnosis not present

## 2015-09-06 DIAGNOSIS — C44329 Squamous cell carcinoma of skin of other parts of face: Secondary | ICD-10-CM | POA: Diagnosis not present

## 2015-09-06 DIAGNOSIS — D485 Neoplasm of uncertain behavior of skin: Secondary | ICD-10-CM | POA: Diagnosis not present

## 2015-09-06 DIAGNOSIS — L814 Other melanin hyperpigmentation: Secondary | ICD-10-CM | POA: Diagnosis not present

## 2015-09-06 DIAGNOSIS — L570 Actinic keratosis: Secondary | ICD-10-CM | POA: Diagnosis not present

## 2015-09-06 DIAGNOSIS — E119 Type 2 diabetes mellitus without complications: Secondary | ICD-10-CM | POA: Diagnosis not present

## 2015-09-06 DIAGNOSIS — I781 Nevus, non-neoplastic: Secondary | ICD-10-CM | POA: Diagnosis not present

## 2015-09-06 DIAGNOSIS — I110 Hypertensive heart disease with heart failure: Secondary | ICD-10-CM | POA: Diagnosis not present

## 2015-09-06 DIAGNOSIS — Z85828 Personal history of other malignant neoplasm of skin: Secondary | ICD-10-CM | POA: Diagnosis not present

## 2015-09-06 DIAGNOSIS — I2510 Atherosclerotic heart disease of native coronary artery without angina pectoris: Secondary | ICD-10-CM | POA: Diagnosis not present

## 2015-09-06 DIAGNOSIS — I509 Heart failure, unspecified: Secondary | ICD-10-CM | POA: Diagnosis not present

## 2015-09-06 DIAGNOSIS — Z79899 Other long term (current) drug therapy: Secondary | ICD-10-CM | POA: Diagnosis not present

## 2015-09-08 ENCOUNTER — Encounter (HOSPITAL_COMMUNITY)
Admission: RE | Admit: 2015-09-08 | Discharge: 2015-09-08 | Disposition: A | Discharge status: Home / Self Care | Payer: Medicare Other | Source: Ambulatory Visit | Attending: Cardiology | Admitting: Cardiology

## 2015-09-08 DIAGNOSIS — I5022 Chronic systolic (congestive) heart failure: Secondary | ICD-10-CM | POA: Diagnosis not present

## 2015-09-08 DIAGNOSIS — Z79899 Other long term (current) drug therapy: Secondary | ICD-10-CM | POA: Diagnosis not present

## 2015-09-08 DIAGNOSIS — K219 Gastro-esophageal reflux disease without esophagitis: Secondary | ICD-10-CM | POA: Diagnosis not present

## 2015-09-08 DIAGNOSIS — Z8673 Personal history of transient ischemic attack (TIA), and cerebral infarction without residual deficits: Secondary | ICD-10-CM | POA: Diagnosis not present

## 2015-09-08 DIAGNOSIS — I110 Hypertensive heart disease with heart failure: Secondary | ICD-10-CM | POA: Diagnosis not present

## 2015-09-08 DIAGNOSIS — E785 Hyperlipidemia, unspecified: Secondary | ICD-10-CM | POA: Diagnosis not present

## 2015-09-10 ENCOUNTER — Encounter (HOSPITAL_COMMUNITY)
Admission: RE | Admit: 2015-09-10 | Discharge: 2015-09-10 | Disposition: A | Discharge status: Home / Self Care | Payer: Medicare Other | Source: Ambulatory Visit | Attending: Cardiology | Admitting: Cardiology

## 2015-09-10 DIAGNOSIS — Z79899 Other long term (current) drug therapy: Secondary | ICD-10-CM | POA: Diagnosis not present

## 2015-09-10 DIAGNOSIS — K219 Gastro-esophageal reflux disease without esophagitis: Secondary | ICD-10-CM | POA: Diagnosis not present

## 2015-09-10 DIAGNOSIS — I5022 Chronic systolic (congestive) heart failure: Secondary | ICD-10-CM | POA: Diagnosis not present

## 2015-09-10 DIAGNOSIS — E785 Hyperlipidemia, unspecified: Secondary | ICD-10-CM | POA: Diagnosis not present

## 2015-09-10 DIAGNOSIS — Z8673 Personal history of transient ischemic attack (TIA), and cerebral infarction without residual deficits: Secondary | ICD-10-CM | POA: Diagnosis not present

## 2015-09-10 DIAGNOSIS — I110 Hypertensive heart disease with heart failure: Secondary | ICD-10-CM | POA: Diagnosis not present

## 2015-09-13 ENCOUNTER — Encounter (HOSPITAL_COMMUNITY)
Admission: RE | Admit: 2015-09-13 | Discharge: 2015-09-13 | Disposition: A | Discharge status: Home / Self Care | Payer: Medicare Other | Source: Ambulatory Visit | Attending: Cardiology | Admitting: Cardiology

## 2015-09-13 DIAGNOSIS — I110 Hypertensive heart disease with heart failure: Secondary | ICD-10-CM | POA: Diagnosis not present

## 2015-09-13 DIAGNOSIS — K219 Gastro-esophageal reflux disease without esophagitis: Secondary | ICD-10-CM | POA: Diagnosis not present

## 2015-09-13 DIAGNOSIS — Z8673 Personal history of transient ischemic attack (TIA), and cerebral infarction without residual deficits: Secondary | ICD-10-CM | POA: Diagnosis not present

## 2015-09-13 DIAGNOSIS — Z79899 Other long term (current) drug therapy: Secondary | ICD-10-CM | POA: Diagnosis not present

## 2015-09-13 DIAGNOSIS — E785 Hyperlipidemia, unspecified: Secondary | ICD-10-CM | POA: Diagnosis not present

## 2015-09-13 DIAGNOSIS — I5022 Chronic systolic (congestive) heart failure: Secondary | ICD-10-CM | POA: Diagnosis not present

## 2015-09-15 ENCOUNTER — Encounter (HOSPITAL_COMMUNITY)
Admission: RE | Admit: 2015-09-15 | Discharge: 2015-09-15 | Disposition: A | Discharge status: Home / Self Care | Payer: Medicare Other | Source: Ambulatory Visit | Attending: Cardiology | Admitting: Cardiology

## 2015-09-15 DIAGNOSIS — Z8673 Personal history of transient ischemic attack (TIA), and cerebral infarction without residual deficits: Secondary | ICD-10-CM | POA: Diagnosis not present

## 2015-09-15 DIAGNOSIS — E785 Hyperlipidemia, unspecified: Secondary | ICD-10-CM | POA: Diagnosis not present

## 2015-09-15 DIAGNOSIS — K219 Gastro-esophageal reflux disease without esophagitis: Secondary | ICD-10-CM | POA: Diagnosis not present

## 2015-09-15 DIAGNOSIS — I5022 Chronic systolic (congestive) heart failure: Secondary | ICD-10-CM | POA: Diagnosis not present

## 2015-09-15 DIAGNOSIS — I110 Hypertensive heart disease with heart failure: Secondary | ICD-10-CM | POA: Diagnosis not present

## 2015-09-15 DIAGNOSIS — Z79899 Other long term (current) drug therapy: Secondary | ICD-10-CM | POA: Diagnosis not present

## 2015-09-17 ENCOUNTER — Encounter (HOSPITAL_COMMUNITY)
Admission: RE | Admit: 2015-09-17 | Discharge: 2015-09-17 | Disposition: A | Discharge status: Home / Self Care | Payer: Medicare Other | Source: Ambulatory Visit | Attending: Cardiology | Admitting: Cardiology

## 2015-09-17 DIAGNOSIS — Z79899 Other long term (current) drug therapy: Secondary | ICD-10-CM | POA: Diagnosis not present

## 2015-09-17 DIAGNOSIS — I5022 Chronic systolic (congestive) heart failure: Secondary | ICD-10-CM | POA: Diagnosis not present

## 2015-09-17 DIAGNOSIS — K219 Gastro-esophageal reflux disease without esophagitis: Secondary | ICD-10-CM | POA: Diagnosis not present

## 2015-09-17 DIAGNOSIS — Z8673 Personal history of transient ischemic attack (TIA), and cerebral infarction without residual deficits: Secondary | ICD-10-CM | POA: Diagnosis not present

## 2015-09-17 DIAGNOSIS — E785 Hyperlipidemia, unspecified: Secondary | ICD-10-CM | POA: Diagnosis not present

## 2015-09-17 DIAGNOSIS — I110 Hypertensive heart disease with heart failure: Secondary | ICD-10-CM | POA: Diagnosis not present

## 2015-09-17 NOTE — Progress Notes (Signed)
Cardiac Individual Treatment Plan  Patient Details  Name: Edward Heath MRN: [id number] Date of Birth: 06-15-1945 Referring Provider:    Initial Encounter Date:       CARDIAC REHAB PHASE II ORIENTATION from 08/24/2015 in [facility]   Date  08/24/15      Visit Diagnosis: No diagnosis found.  Patient's Home Medications on Admission:  Current outpatient prescriptions:  .  amiodarone (PACERONE) 200 MG tablet, Take 1 tablet (200 mg total) by mouth daily., Disp: 30 tablet, Rfl: 0 .  apixaban (ELIQUIS) 5 MG TABS tablet, Take 1 tablet (5 mg total) by mouth 2 (two) times daily. 2 BID X 7 days then 1 BID, Disp: 75 tablet, Rfl: 0 .  aspirin EC 81 MG tablet, Take 81 mg by mouth., Disp: , Rfl:  .  atorvastatin (LIPITOR) 40 MG tablet, Take 40 mg by mouth daily.  , Disp: , Rfl:  .  celecoxib (CELEBREX) 200 MG capsule, Take 1 capsule (200 mg total) by mouth daily. With food (Patient taking differently: Take 200 mg by mouth daily as needed for moderate pain (Pt takes several times per week per MD instructions but not daily). With food), Disp: 30 capsule, Rfl: 5 .  Cholecalciferol (VITAMIN D-1000 MAX ST) 1000 units tablet, Take 1 tablet (1,000 Units total) by mouth 2 (two) times daily., Disp: 60 tablet, Rfl: 11 .  cyanocobalamin 1000 MCG tablet, Take 1,000 mcg by mouth daily., Disp: , Rfl:  .  docusate (COLACE) 60 MG/15ML syrup, Take 60 mg by mouth daily.  , Disp: , Rfl:  .  ferrous sulfate 325 (65 FE) MG tablet, Take 325 mg by mouth., Disp: , Rfl:  .  furosemide (LASIX) 20 MG tablet, TAKE (2) TABLETS DAILY (Patient taking differently: take 1 (20mg ) tablet daily), Disp: 60 tablet, Rfl: 2 .  gabapentin (NEURONTIN) 300 MG capsule, Take 300 mg by mouth 2 (two) times daily., Disp: , Rfl:  .  glipiZIDE (GLUCOTROL) 5 MG tablet, Take 5 mg by mouth daily before breakfast. , Disp: , Rfl:  .  lisinopril (PRINIVIL,ZESTRIL) 5 MG tablet, Take 5 mg by mouth daily.  , Disp: , Rfl:  .  Magnesium  250 MG TABS, Take 500 mg by mouth 2 (two) times daily. , Disp: , Rfl:  .  metFORMIN (GLUCOPHAGE) 1000 MG tablet, Take 1,000 mg by mouth 2 (two) times daily with a meal. , Disp: , Rfl:  .  metoprolol succinate (TOPROL-XL) 100 MG 24 hr tablet, Take 1 tablet (100 mg total) by mouth daily. Take with or immediately following a meal., Disp: 90 tablet, Rfl: 0 .  nitroGLYCERIN (NITROSTAT) 0.4 MG SL tablet, Place 0.4 mg under the tongue., Disp: , Rfl:  .  Omega-3 1000 MG CAPS, Take 2 g by mouth 2 (two) times daily. , Disp: , Rfl:  .  omeprazole (PRILOSEC) 10 MG capsule, Take 20 mg by mouth 2 (two) times daily. , Disp: , Rfl:  .  Potassium 95 MG TBCR, Take 1 tablet by mouth 2 (two) times daily. , Disp: , Rfl:  .  Salicylic Acid 2 % CREA, Apply 1 application topically 2 (two) times daily as needed., Disp: , Rfl:  .  traMADol (ULTRAM) 50 MG tablet, Take 50 mg by mouth 2 (two) times daily., Disp: , Rfl:  .  vitamin E (E-400) 400 UNIT capsule, Take 400 Units by mouth., Disp: , Rfl:   Past Medical History: Past Medical History  Diagnosis Date  . Hypertension   .  Stroke ([place])   . Hyperlipidemia   . GERD (gastroesophageal reflux disease)   . Skin cancer 06/2014    left cheek  . TIA (transient ischemic attack) 2009  . Heart attack ([place]) 2004, 2015  . Lumbar vertebral fracture ([place]) 2012    Tobacco Use: History  Smoking status  . Never Smoker   Smokeless tobacco  . Current User  . Types: Chew    Labs:     Recent Review Flowsheet Data    There is no flowsheet data to display.      Capillary Blood Glucose: No results found for: GLUCAP   Exercise Target Goals:    Exercise Program Goal: Individual exercise prescription set with THRR, safety & activity barriers. Participant demonstrates ability to understand and report RPE using BORG scale, to self-measure pulse accurately, and to acknowledge the importance of the exercise prescription.  Exercise Prescription Goal: Starting with aerobic  activity 30 plus minutes a day, 3 days per week for initial exercise prescription. Provide home exercise prescription and guidelines that participant acknowledges understanding prior to discharge.  Activity Barriers & Risk Stratification:     Activity Barriers & Cardiac Risk Stratification - 08/24/15 1715    Activity Barriers & Cardiac Risk Stratification   Activity Barriers None   Cardiac Risk Stratification High      6 Minute Walk:     6 Minute Walk      08/24/15 1553       6 Minute Walk   Phase Initial     Distance 1150 feet     Walk Time 6 minutes     # of Rest Breaks 0     MPH 2.18     METS 2.67     RPE 11     Perceived Dyspnea  9     VO2 Peak 6.78     Symptoms No     Resting HR 52 bpm     Resting BP 100/52 mmHg     Max Ex. HR 75 bpm     Max Ex. BP 122/64 mmHg     2 Minute Post BP 98/58 mmHg        Initial Exercise Prescription:     Initial Exercise Prescription - 08/24/15 1600    Date of Initial Exercise RX and Referring Provider   Date 08/24/15   Treadmill   MPH 1.2   Grade 0   Minutes 15   METs 1.92   NuStep   Level 2   Minutes 15   METs 1.5   Prescription Details   Frequency (times per week) 3   Intensity   THRR REST +  30   THRR 40-80% of Max Heartrate 91-130   Ratings of Perceived Exertion 11-13   Progression   Progression Continue to progress workloads to maintain intensity without signs/symptoms of physical distress.   Resistance Training   Training Prescription Yes   Weight 1   Reps 10-12      Perform Capillary Blood Glucose checks as needed.  Exercise Prescription Changes:      Exercise Prescription Changes      09/17/15 1900           Exercise Review   Progression Yes       Response to Exercise   Blood Pressure (Admit) 98/54 mmHg       Blood Pressure (Exercise) 120/60 mmHg       Blood Pressure (Exit) 92/60 mmHg       Heart Rate (Admit)  53 bpm       Heart Rate (Exercise) 81 bpm       Heart Rate (Exit) 68 bpm        Symptoms No       Duration Progress to 30 minutes of continuous aerobic without signs/symptoms of physical distress       Progression   Progression Continue to progress workloads to maintain intensity without signs/symptoms of physical distress.       Resistance Training   Training Prescription Yes       Weight 2.0       Reps 10-12       Treadmill   MPH 2.1       Grade 0       Minutes 15       METs 2.61       NuStep   Level 2       Minutes 15       METs 3.72       Home Exercise Plan   Plans to continue exercise at Home       Frequency Add 2 additional days to program exercise sessions.          Exercise Comments:      Exercise Comments      09/17/15 1936           Exercise Comments Patient is progressing appropriately.           Discharge Exercise Prescription (Final Exercise Prescription Changes):     Exercise Prescription Changes - 09/17/15 1900    Exercise Review   Progression Yes   Response to Exercise   Blood Pressure (Admit) 98/54 mmHg   Blood Pressure (Exercise) 120/60 mmHg   Blood Pressure (Exit) 92/60 mmHg   Heart Rate (Admit) 53 bpm   Heart Rate (Exercise) 81 bpm   Heart Rate (Exit) 68 bpm   Symptoms No   Duration Progress to 30 minutes of continuous aerobic without signs/symptoms of physical distress   Progression   Progression Continue to progress workloads to maintain intensity without signs/symptoms of physical distress.   Resistance Training   Training Prescription Yes   Weight 2.0   Reps 10-12   Treadmill   MPH 2.1   Grade 0   Minutes 15   METs 2.61   NuStep   Level 2   Minutes 15   METs 3.72   Home Exercise Plan   Plans to continue exercise at Home   Frequency Add 2 additional days to program exercise sessions.      Nutrition:  Target Goals: Understanding of nutrition guidelines, daily intake of sodium 1500mg , cholesterol 200mg , calories 30% from fat and 7% or less from saturated fats, daily to have 5 or more servings of  fruits and vegetables.  Biometrics:     Pre Biometrics - 08/25/15 1403    Pre Biometrics   Height 5\' 10"  (1.778 m)   Weight 242 lb 8 oz (109.997 kg)   Waist Circumference 45 inches   Hip Circumference 50 inches   Waist to Hip Ratio 0.9 %   BMI (Calculated) 34.9   Triceps Skinfold 15 mm   % Body Fat 32.3 %   Grip Strength 95 kg   Flexibility 0 in   Single Leg Stand 13 seconds       Nutrition Therapy Plan and Nutrition Goals:     Nutrition Therapy & Goals - 08/24/15 1717    Nutrition Therapy   Diet --  Regular diet  Intervention Plan   Intervention Prescribe, educate and counsel regarding individualized specific dietary modifications aiming towards targeted core components such as weight, hypertension, lipid management, diabetes, heart failure and other comorbidities.   Expected Outcomes Short Term Goal: Understand basic principles of dietary content, such as calories, fat, sodium, cholesterol and nutrients.;Long Term Goal: Adherence to prescribed nutrition plan.      Nutrition Discharge: Rate Your Plate Scores:     Nutrition Assessments - 08/24/15 1718    MEDFICTS Scores   Pre Score 25      Nutrition Goals Re-Evaluation:     Nutrition Goals Re-Evaluation      09/17/15 0849           Intervention Plan   Intervention Continue to educate, counsel and set short/long term goals regarding individualized specific personal dietary modifications.          Psychosocial: Target Goals: Acknowledge presence or absence of depression, maximize coping skills, provide positive support system. Participant is able to verbalize types and ability to use techniques and skills needed for reducing stress and depression.  Initial Review & Psychosocial Screening:     Initial Psych Review & Screening - 08/24/15 1723    Initial Review   Current issues with --  None   Family Dynamics   Good Support System? Yes   Barriers   Psychosocial barriers to participate in program  There are no identifiable barriers or psychosocial needs.   Screening Interventions   Interventions Encouraged to exercise      Quality of Life Scores:     Quality of Life - 08/25/15 1405    Quality of Life Scores   Health/Function Pre 24.96 %   Socioeconomic Pre 27 %   Psych/Spiritual Pre 27.21 %   Family Pre 27 %   GLOBAL Pre 26.11 %      PHQ-9:     Recent Review Flowsheet Data    Depression screen [facility] 2/9 08/24/2015 05/25/2015 08/05/2014 10/16/2013   Decreased Interest 0 0 0 0   Down, Depressed, Hopeless 0 0 1 0   PHQ - 2 Score 0 0 1 0   Altered sleeping 0 - - -   Tired, decreased energy 0 - - -   Change in appetite 0 - - -   Feeling bad or failure about yourself  0 - - -   Trouble concentrating 0 - - -   Moving slowly or fidgety/restless 0 - - -   Suicidal thoughts 0 - - -   PHQ-9 Score 0 - - -   Difficult doing work/chores Not difficult at all - - -      Psychosocial Evaluation and Intervention:     Psychosocial Evaluation - 08/24/15 1723    Psychosocial Evaluation & Interventions   Comments Patient states he is not depressed and does not need counseling   Continued Psychosocial Services Needed No      Psychosocial Re-Evaluation:     Psychosocial Re-Evaluation      09/17/15 0852           Psychosocial Re-Evaluation   Interventions Encouraged to attend Cardiac Rehabilitation for the exercise       Continued Psychosocial Services Needed No          Vocational Rehabilitation: Provide vocational rehab assistance to qualifying candidates.   Vocational Rehab Evaluation & Intervention:     Vocational Rehab - 08/24/15 1717    Initial Vocational Rehab Evaluation & Intervention   Assessment shows need for Vocational Rehabilitation  No      Education: Education Goals: Education classes will be provided on a weekly basis, covering required topics. Participant will state understanding/return demonstration of topics presented.  Learning  Barriers/Preferences:     Learning Barriers/Preferences - 08/24/15 1715    Learning Barriers/Preferences   Learning Barriers None   Learning Preferences Group Instruction  Learns by doing      Education Topics: Hypertension, Hypertension Reduction -Define heart disease and high blood pressure. Discus how high blood pressure affects the body and ways to reduce high blood pressure.   Exercise and Your Heart -Discuss why it is important to exercise, the FITT principles of exercise, normal and abnormal responses to exercise, and how to exercise safely.   Angina -Discuss definition of angina, causes of angina, treatment of angina, and how to decrease risk of having angina.   Cardiac Medications -Review what the following cardiac medications are used for, how they affect the body, and side effects that may occur when taking the medications.  Medications include Aspirin, Beta blockers, calcium channel blockers, ACE Inhibitors, angiotensin receptor blockers, diuretics, digoxin, and antihyperlipidemics.   Congestive Heart Failure -Discuss the definition of CHF, how to live with CHF, the signs and symptoms of CHF, and how keep track of weight and sodium intake.   Heart Disease and Intimacy -Discus the effect sexual activity has on the heart, how changes occur during intimacy as we age, and safety during sexual activity.   Smoking Cessation / COPD -Discuss different methods to quit smoking, the health benefits of quitting smoking, and the definition of COPD.   Nutrition I: Fats -Discuss the types of cholesterol, what cholesterol does to the heart, and how cholesterol levels can be controlled.   Nutrition II: Labels -Discuss the different components of food labels and how to read food label   Heart Parts and Heart Disease -Discuss the anatomy of the heart, the pathway of blood circulation through the heart, and these are affected by heart disease.   Stress I: Signs and  Symptoms -Discuss the causes of stress, how stress may lead to anxiety and depression, and ways to limit stress.      CARDIAC REHAB PHASE II EXERCISE from 09/15/2015 in [facility]   Date  09/01/15   Educator  D Kendell Gammon   Instruction Review Code  2- meets goals/outcomes      Stress II: Relaxation -Discuss different types of relaxation techniques to limit stress.      CARDIAC REHAB PHASE II EXERCISE from 09/15/2015 in [place]   Date  09/08/15   Educator  D Emnet Monk   Instruction Review Code  2- meets goals/outcomes      Warning Signs of Stroke / TIA -Discuss definition of a stroke, what the signs and symptoms are of a stroke, and how to identify when someone is having stroke.          CARDIAC REHAB PHASE II EXERCISE from 09/15/2015 in [facility]   Date  09/15/15   Educator  Russella Dar   Instruction Review Code  2- meets goals/outcomes      Knowledge Questionnaire Score:     Knowledge Questionnaire Score - 08/24/15 1716    Knowledge Questionnaire Score   Pre Score 22/24      Core Components/Risk Factors/Patient Goals at Admission:     Personal Goals and Risk Factors at Admission - 08/24/15 1719    Core Components/Risk Factors/Patient Goals on Admission    Weight Management  Yes   Admit Weight 242 lb 8 oz (109.997 kg)   Goal Weight: Short Term 237 lb 8 oz (107.729 kg)   Goal Weight: Long Term 235 lb (106.595 kg)   Expected Outcomes Short Term: Continue to assess and modify interventions until short term weight is achieved   Sedentary --  No, very activie   Increase Strength and Stamina Yes   Intervention Provide advice, education, support and counseling about physical activity/exercise needs.;Develop an individualized exercise prescription for aerobic and resistive training based on initial evaluation findings, risk stratification, comorbidities and participant's personal goals.   Tobacco Cessation --  None  needed   Diabetes --  Patient states he already has dieabetes under control.    Personal Goal Other Yes   Personal Goal More strength   Intervention More stamina   Expected Outcomes Lose 5-7lbs, Be able to continue to play golf      Core Components/Risk Factors/Patient Goals Review:      Goals and Risk Factor Review      09/17/15 0849           Core Components/Risk Factors/Patient Goals Review   Personal Goals Review Weight Management/Obesity;Increase Strength and Stamina       Review patient has not showed any changes at this time. Will continue to education and progress patient as needed.        Expected Outcomes wight loss and increased energy          Core Components/Risk Factors/Patient Goals at Discharge (Final Review):      Goals and Risk Factor Review - 09/17/15 0849    Core Components/Risk Factors/Patient Goals Review   Personal Goals Review Weight Management/Obesity;Increase Strength and Stamina   Review patient has not showed any changes at this time. Will continue to education and progress patient as needed.    Expected Outcomes wight loss and increased energy      ITP Comments:   Comments: Patient is expected to progress towards personal goals after completing sessions. Recommend continued exercise, lifestyle modifications, education and utilization of home exercise plan to increase stamina and strength.

## 2015-09-20 ENCOUNTER — Encounter (HOSPITAL_COMMUNITY)
Admission: RE | Admit: 2015-09-20 | Discharge: 2015-09-20 | Disposition: A | Discharge status: Home / Self Care | Payer: Medicare Other | Source: Ambulatory Visit | Attending: Cardiology | Admitting: Cardiology

## 2015-09-20 DIAGNOSIS — K219 Gastro-esophageal reflux disease without esophagitis: Secondary | ICD-10-CM | POA: Insufficient documentation

## 2015-09-20 DIAGNOSIS — I252 Old myocardial infarction: Secondary | ICD-10-CM | POA: Diagnosis not present

## 2015-09-20 DIAGNOSIS — Z8673 Personal history of transient ischemic attack (TIA), and cerebral infarction without residual deficits: Secondary | ICD-10-CM | POA: Diagnosis not present

## 2015-09-20 DIAGNOSIS — I5022 Chronic systolic (congestive) heart failure: Secondary | ICD-10-CM | POA: Insufficient documentation

## 2015-09-20 DIAGNOSIS — E785 Hyperlipidemia, unspecified: Secondary | ICD-10-CM | POA: Insufficient documentation

## 2015-09-20 DIAGNOSIS — Z79899 Other long term (current) drug therapy: Secondary | ICD-10-CM | POA: Diagnosis not present

## 2015-09-20 DIAGNOSIS — I110 Hypertensive heart disease with heart failure: Secondary | ICD-10-CM | POA: Diagnosis not present

## 2015-09-22 ENCOUNTER — Encounter (HOSPITAL_COMMUNITY)
Admission: RE | Admit: 2015-09-22 | Discharge: 2015-09-22 | Disposition: A | Discharge status: Home / Self Care | Payer: Medicare Other | Source: Ambulatory Visit | Attending: Cardiology | Admitting: Cardiology

## 2015-09-22 DIAGNOSIS — K219 Gastro-esophageal reflux disease without esophagitis: Secondary | ICD-10-CM | POA: Diagnosis not present

## 2015-09-22 DIAGNOSIS — E785 Hyperlipidemia, unspecified: Secondary | ICD-10-CM | POA: Diagnosis not present

## 2015-09-22 DIAGNOSIS — Z8673 Personal history of transient ischemic attack (TIA), and cerebral infarction without residual deficits: Secondary | ICD-10-CM | POA: Diagnosis not present

## 2015-09-22 DIAGNOSIS — I110 Hypertensive heart disease with heart failure: Secondary | ICD-10-CM | POA: Diagnosis not present

## 2015-09-22 DIAGNOSIS — Z79899 Other long term (current) drug therapy: Secondary | ICD-10-CM | POA: Diagnosis not present

## 2015-09-22 DIAGNOSIS — I5022 Chronic systolic (congestive) heart failure: Secondary | ICD-10-CM | POA: Diagnosis not present

## 2015-09-24 ENCOUNTER — Encounter (HOSPITAL_COMMUNITY): Payer: Medicare Other

## 2015-09-24 ENCOUNTER — Encounter (HOSPITAL_COMMUNITY)
Admission: RE | Admit: 2015-09-24 | Discharge: 2015-09-24 | Disposition: A | Discharge status: Home / Self Care | Payer: Medicare Other | Source: Ambulatory Visit | Attending: Cardiology | Admitting: Cardiology

## 2015-09-24 DIAGNOSIS — Z79899 Other long term (current) drug therapy: Secondary | ICD-10-CM | POA: Diagnosis not present

## 2015-09-24 DIAGNOSIS — E785 Hyperlipidemia, unspecified: Secondary | ICD-10-CM | POA: Diagnosis not present

## 2015-09-24 DIAGNOSIS — K219 Gastro-esophageal reflux disease without esophagitis: Secondary | ICD-10-CM | POA: Diagnosis not present

## 2015-09-24 DIAGNOSIS — I110 Hypertensive heart disease with heart failure: Secondary | ICD-10-CM | POA: Diagnosis not present

## 2015-09-24 DIAGNOSIS — Z8673 Personal history of transient ischemic attack (TIA), and cerebral infarction without residual deficits: Secondary | ICD-10-CM | POA: Diagnosis not present

## 2015-09-24 DIAGNOSIS — I5022 Chronic systolic (congestive) heart failure: Secondary | ICD-10-CM | POA: Diagnosis not present

## 2015-09-27 ENCOUNTER — Encounter (HOSPITAL_COMMUNITY)
Admission: RE | Admit: 2015-09-27 | Discharge: 2015-09-27 | Disposition: A | Discharge status: Home / Self Care | Payer: Medicare Other | Source: Ambulatory Visit | Attending: Cardiology | Admitting: Cardiology

## 2015-09-27 DIAGNOSIS — E785 Hyperlipidemia, unspecified: Secondary | ICD-10-CM | POA: Diagnosis not present

## 2015-09-27 DIAGNOSIS — Z8673 Personal history of transient ischemic attack (TIA), and cerebral infarction without residual deficits: Secondary | ICD-10-CM | POA: Diagnosis not present

## 2015-09-27 DIAGNOSIS — Z79899 Other long term (current) drug therapy: Secondary | ICD-10-CM | POA: Diagnosis not present

## 2015-09-27 DIAGNOSIS — I110 Hypertensive heart disease with heart failure: Secondary | ICD-10-CM | POA: Diagnosis not present

## 2015-09-27 DIAGNOSIS — K219 Gastro-esophageal reflux disease without esophagitis: Secondary | ICD-10-CM | POA: Diagnosis not present

## 2015-09-27 DIAGNOSIS — I5022 Chronic systolic (congestive) heart failure: Secondary | ICD-10-CM | POA: Diagnosis not present

## 2015-09-29 ENCOUNTER — Encounter (HOSPITAL_COMMUNITY)
Admission: RE | Admit: 2015-09-29 | Discharge: 2015-09-29 | Disposition: A | Discharge status: Home / Self Care | Payer: Medicare Other | Source: Ambulatory Visit | Attending: Cardiology | Admitting: Cardiology

## 2015-09-29 DIAGNOSIS — Z79899 Other long term (current) drug therapy: Secondary | ICD-10-CM | POA: Diagnosis not present

## 2015-09-29 DIAGNOSIS — K219 Gastro-esophageal reflux disease without esophagitis: Secondary | ICD-10-CM | POA: Diagnosis not present

## 2015-09-29 DIAGNOSIS — Z8673 Personal history of transient ischemic attack (TIA), and cerebral infarction without residual deficits: Secondary | ICD-10-CM | POA: Diagnosis not present

## 2015-09-29 DIAGNOSIS — E785 Hyperlipidemia, unspecified: Secondary | ICD-10-CM | POA: Diagnosis not present

## 2015-09-29 DIAGNOSIS — I5022 Chronic systolic (congestive) heart failure: Secondary | ICD-10-CM | POA: Diagnosis not present

## 2015-09-29 DIAGNOSIS — I110 Hypertensive heart disease with heart failure: Secondary | ICD-10-CM | POA: Diagnosis not present

## 2015-10-01 ENCOUNTER — Encounter (HOSPITAL_COMMUNITY)
Admission: RE | Admit: 2015-10-01 | Discharge: 2015-10-01 | Disposition: A | Discharge status: Home / Self Care | Payer: Medicare Other | Source: Ambulatory Visit | Attending: Cardiology | Admitting: Cardiology

## 2015-10-01 DIAGNOSIS — Z79899 Other long term (current) drug therapy: Secondary | ICD-10-CM | POA: Diagnosis not present

## 2015-10-01 DIAGNOSIS — K219 Gastro-esophageal reflux disease without esophagitis: Secondary | ICD-10-CM | POA: Diagnosis not present

## 2015-10-01 DIAGNOSIS — I5022 Chronic systolic (congestive) heart failure: Secondary | ICD-10-CM | POA: Diagnosis not present

## 2015-10-01 DIAGNOSIS — I110 Hypertensive heart disease with heart failure: Secondary | ICD-10-CM | POA: Diagnosis not present

## 2015-10-01 DIAGNOSIS — Z8673 Personal history of transient ischemic attack (TIA), and cerebral infarction without residual deficits: Secondary | ICD-10-CM | POA: Diagnosis not present

## 2015-10-01 DIAGNOSIS — E785 Hyperlipidemia, unspecified: Secondary | ICD-10-CM | POA: Diagnosis not present

## 2015-10-04 ENCOUNTER — Encounter (HOSPITAL_COMMUNITY)
Admission: RE | Admit: 2015-10-04 | Discharge: 2015-10-04 | Disposition: A | Discharge status: Home / Self Care | Payer: Medicare Other | Source: Ambulatory Visit | Attending: Cardiology | Admitting: Cardiology

## 2015-10-04 DIAGNOSIS — K219 Gastro-esophageal reflux disease without esophagitis: Secondary | ICD-10-CM | POA: Diagnosis not present

## 2015-10-04 DIAGNOSIS — Z79899 Other long term (current) drug therapy: Secondary | ICD-10-CM | POA: Diagnosis not present

## 2015-10-04 DIAGNOSIS — I110 Hypertensive heart disease with heart failure: Secondary | ICD-10-CM | POA: Diagnosis not present

## 2015-10-04 DIAGNOSIS — I5022 Chronic systolic (congestive) heart failure: Secondary | ICD-10-CM | POA: Diagnosis not present

## 2015-10-04 DIAGNOSIS — Z8673 Personal history of transient ischemic attack (TIA), and cerebral infarction without residual deficits: Secondary | ICD-10-CM | POA: Diagnosis not present

## 2015-10-04 DIAGNOSIS — E785 Hyperlipidemia, unspecified: Secondary | ICD-10-CM | POA: Diagnosis not present

## 2015-10-06 ENCOUNTER — Encounter (HOSPITAL_COMMUNITY)
Admission: RE | Admit: 2015-10-06 | Discharge: 2015-10-06 | Disposition: A | Discharge status: Home / Self Care | Payer: Medicare Other | Source: Ambulatory Visit | Attending: Cardiology | Admitting: Cardiology

## 2015-10-06 DIAGNOSIS — Z8673 Personal history of transient ischemic attack (TIA), and cerebral infarction without residual deficits: Secondary | ICD-10-CM | POA: Diagnosis not present

## 2015-10-06 DIAGNOSIS — Z79899 Other long term (current) drug therapy: Secondary | ICD-10-CM | POA: Diagnosis not present

## 2015-10-06 DIAGNOSIS — I110 Hypertensive heart disease with heart failure: Secondary | ICD-10-CM | POA: Diagnosis not present

## 2015-10-06 DIAGNOSIS — I5022 Chronic systolic (congestive) heart failure: Secondary | ICD-10-CM | POA: Diagnosis not present

## 2015-10-06 DIAGNOSIS — K219 Gastro-esophageal reflux disease without esophagitis: Secondary | ICD-10-CM | POA: Diagnosis not present

## 2015-10-06 DIAGNOSIS — E785 Hyperlipidemia, unspecified: Secondary | ICD-10-CM | POA: Diagnosis not present

## 2015-10-08 ENCOUNTER — Encounter (HOSPITAL_COMMUNITY)
Admission: RE | Admit: 2015-10-08 | Discharge: 2015-10-08 | Disposition: A | Discharge status: Home / Self Care | Payer: Medicare Other | Source: Ambulatory Visit | Attending: Cardiology | Admitting: Cardiology

## 2015-10-08 DIAGNOSIS — I5022 Chronic systolic (congestive) heart failure: Secondary | ICD-10-CM | POA: Diagnosis not present

## 2015-10-08 DIAGNOSIS — Z8673 Personal history of transient ischemic attack (TIA), and cerebral infarction without residual deficits: Secondary | ICD-10-CM | POA: Diagnosis not present

## 2015-10-08 DIAGNOSIS — E785 Hyperlipidemia, unspecified: Secondary | ICD-10-CM | POA: Diagnosis not present

## 2015-10-08 DIAGNOSIS — I110 Hypertensive heart disease with heart failure: Secondary | ICD-10-CM | POA: Diagnosis not present

## 2015-10-08 DIAGNOSIS — Z79899 Other long term (current) drug therapy: Secondary | ICD-10-CM | POA: Diagnosis not present

## 2015-10-08 DIAGNOSIS — K219 Gastro-esophageal reflux disease without esophagitis: Secondary | ICD-10-CM | POA: Diagnosis not present

## 2015-10-11 ENCOUNTER — Encounter (HOSPITAL_COMMUNITY): Payer: Medicare Other

## 2015-10-12 DIAGNOSIS — Z955 Presence of coronary angioplasty implant and graft: Secondary | ICD-10-CM | POA: Diagnosis not present

## 2015-10-12 DIAGNOSIS — E7800 Pure hypercholesterolemia, unspecified: Secondary | ICD-10-CM | POA: Diagnosis not present

## 2015-10-12 DIAGNOSIS — Z95810 Presence of automatic (implantable) cardiac defibrillator: Secondary | ICD-10-CM | POA: Diagnosis not present

## 2015-10-12 DIAGNOSIS — I252 Old myocardial infarction: Secondary | ICD-10-CM | POA: Diagnosis not present

## 2015-10-12 DIAGNOSIS — M25473 Effusion, unspecified ankle: Secondary | ICD-10-CM | POA: Diagnosis not present

## 2015-10-12 DIAGNOSIS — I255 Ischemic cardiomyopathy: Secondary | ICD-10-CM | POA: Diagnosis not present

## 2015-10-12 DIAGNOSIS — E119 Type 2 diabetes mellitus without complications: Secondary | ICD-10-CM | POA: Diagnosis not present

## 2015-10-12 DIAGNOSIS — I10 Essential (primary) hypertension: Secondary | ICD-10-CM | POA: Diagnosis not present

## 2015-10-12 DIAGNOSIS — Z8673 Personal history of transient ischemic attack (TIA), and cerebral infarction without residual deficits: Secondary | ICD-10-CM | POA: Diagnosis not present

## 2015-10-12 DIAGNOSIS — I4891 Unspecified atrial fibrillation: Secondary | ICD-10-CM | POA: Diagnosis not present

## 2015-10-13 ENCOUNTER — Encounter (HOSPITAL_COMMUNITY): Payer: Medicare Other

## 2015-10-15 ENCOUNTER — Encounter (HOSPITAL_COMMUNITY)
Admission: RE | Admit: 2015-10-15 | Discharge: 2015-10-15 | Disposition: A | Discharge status: Home / Self Care | Payer: Medicare Other | Source: Ambulatory Visit | Attending: Cardiology | Admitting: Cardiology

## 2015-10-15 DIAGNOSIS — Z79899 Other long term (current) drug therapy: Secondary | ICD-10-CM | POA: Diagnosis not present

## 2015-10-15 DIAGNOSIS — I110 Hypertensive heart disease with heart failure: Secondary | ICD-10-CM | POA: Diagnosis not present

## 2015-10-15 DIAGNOSIS — I5022 Chronic systolic (congestive) heart failure: Secondary | ICD-10-CM

## 2015-10-15 DIAGNOSIS — K219 Gastro-esophageal reflux disease without esophagitis: Secondary | ICD-10-CM | POA: Diagnosis not present

## 2015-10-15 DIAGNOSIS — Z8673 Personal history of transient ischemic attack (TIA), and cerebral infarction without residual deficits: Secondary | ICD-10-CM | POA: Diagnosis not present

## 2015-10-15 DIAGNOSIS — E785 Hyperlipidemia, unspecified: Secondary | ICD-10-CM | POA: Diagnosis not present

## 2015-10-15 NOTE — Progress Notes (Addendum)
Daily Session Note  Patient Details  Name: Edward Heath MRN: [id number] Date of Birth: [birth year] Referring Provider:    Encounter Date: 10/15/2015  Check In:     Session Check In - 10/15/15 0930    Check-In   Location [facility]   Staff Present Cornell Gaber Angelina Pih, MS, EP, [facility], Exercise Physiologist;Christy Oletta Lamas, RN, BSN  Nils Flack, EP   Supervising physician immediately available to respond to emergencies See telemetry face sheet for immediately available MD   Medication changes reported     No   Fall or balance concerns reported    No   Warm-up and Cool-down Performed as group-led instruction   Resistance Training Performed Yes   VAD Patient? No   Pain Assessment   Currently in Pain? No/denies   Pain Score 0-No pain   Multiple Pain Sites No      Capillary Blood Glucose: No results found for this or any previous visit (from the past 24 hour(s)).   Goals Met:  Independence with exercise equipment Exercise tolerated well No report of cardiac concerns or symptoms Strength training completed today  Goals Unmet:  RPE BP HR  Comments: Patient is progressing appropriately. Check out: 1030   Dr. Kate Sable is Medical Director for [facility].

## 2015-10-17 NOTE — Progress Notes (Signed)
Cardiac Individual Treatment Plan  Patient Details  Name: Edward Heath MRN: [id number] Date of Birth: April 22, 1946 Referring Provider:    Initial Encounter Date:       CARDIAC REHAB PHASE II ORIENTATION from 08/24/2015 in [facility]   Date  08/24/15      Visit Diagnosis: CHF (congestive heart failure), NYHA class I, chronic, systolic (HCC)  Patient's Home Medications on Admission:  Current outpatient prescriptions:  .  amiodarone (PACERONE) 200 MG tablet, Take 1 tablet (200 mg total) by mouth daily., Disp: 30 tablet, Rfl: 0 .  apixaban (ELIQUIS) 5 MG TABS tablet, Take 1 tablet (5 mg total) by mouth 2 (two) times daily. 2 BID X 7 days then 1 BID, Disp: 75 tablet, Rfl: 0 .  aspirin EC 81 MG tablet, Take 81 mg by mouth., Disp: , Rfl:  .  atorvastatin (LIPITOR) 40 MG tablet, Take 40 mg by mouth daily.  , Disp: , Rfl:  .  celecoxib (CELEBREX) 200 MG capsule, Take 1 capsule (200 mg total) by mouth daily. With food (Patient taking differently: Take 200 mg by mouth daily as needed for moderate pain (Pt takes several times per week per MD instructions but not daily). With food), Disp: 30 capsule, Rfl: 5 .  Cholecalciferol (VITAMIN D-1000 MAX ST) 1000 units tablet, Take 1 tablet (1,000 Units total) by mouth 2 (two) times daily., Disp: 60 tablet, Rfl: 11 .  cyanocobalamin 1000 MCG tablet, Take 1,000 mcg by mouth daily., Disp: , Rfl:  .  docusate (COLACE) 60 MG/15ML syrup, Take 60 mg by mouth daily.  , Disp: , Rfl:  .  ferrous sulfate 325 (65 FE) MG tablet, Take 325 mg by mouth., Disp: , Rfl:  .  furosemide (LASIX) 20 MG tablet, TAKE (2) TABLETS DAILY (Patient taking differently: take 1 (20mg ) tablet daily), Disp: 60 tablet, Rfl: 2 .  gabapentin (NEURONTIN) 300 MG capsule, Take 300 mg by mouth 2 (two) times daily., Disp: , Rfl:  .  glipiZIDE (GLUCOTROL) 5 MG tablet, Take 5 mg by mouth daily before breakfast. , Disp: , Rfl:  .  lisinopril (PRINIVIL,ZESTRIL) 5 MG tablet, Take 5  mg by mouth daily.  , Disp: , Rfl:  .  Magnesium 250 MG TABS, Take 500 mg by mouth 2 (two) times daily. , Disp: , Rfl:  .  metFORMIN (GLUCOPHAGE) 1000 MG tablet, Take 1,000 mg by mouth 2 (two) times daily with a meal. , Disp: , Rfl:  .  metoprolol succinate (TOPROL-XL) 100 MG 24 hr tablet, Take 1 tablet (100 mg total) by mouth daily. Take with or immediately following a meal., Disp: 90 tablet, Rfl: 0 .  nitroGLYCERIN (NITROSTAT) 0.4 MG SL tablet, Place 0.4 mg under the tongue., Disp: , Rfl:  .  Omega-3 1000 MG CAPS, Take 2 g by mouth 2 (two) times daily. , Disp: , Rfl:  .  omeprazole (PRILOSEC) 10 MG capsule, Take 20 mg by mouth 2 (two) times daily. , Disp: , Rfl:  .  Potassium 95 MG TBCR, Take 1 tablet by mouth 2 (two) times daily. , Disp: , Rfl:  .  Salicylic Acid 2 % CREA, Apply 1 application topically 2 (two) times daily as needed., Disp: , Rfl:  .  traMADol (ULTRAM) 50 MG tablet, Take 50 mg by mouth 2 (two) times daily., Disp: , Rfl:  .  vitamin E (E-400) 400 UNIT capsule, Take 400 Units by mouth., Disp: , Rfl:   Past Medical History: Past Medical History  Diagnosis Date  . Hypertension   . Stroke ([place])   . Hyperlipidemia   . GERD (gastroesophageal reflux disease)   . Skin cancer 06/2014    left cheek  . TIA (transient ischemic attack) 2009  . Heart attack ([place]) 2004, 2015  . Lumbar vertebral fracture ([place]) 2012    Tobacco Use: History  Smoking status  . Never Smoker   Smokeless tobacco  . Current User  . Types: Chew    Labs:     Recent Review Flowsheet Data    There is no flowsheet data to display.      Capillary Blood Glucose: No results found for: GLUCAP   Exercise Target Goals:    Exercise Program Goal: Individual exercise prescription set with THRR, safety & activity barriers. Participant demonstrates ability to understand and report RPE using BORG scale, to self-measure pulse accurately, and to acknowledge the importance of the exercise  prescription.  Exercise Prescription Goal: Starting with aerobic activity 30 plus minutes a day, 3 days per week for initial exercise prescription. Provide home exercise prescription and guidelines that participant acknowledges understanding prior to discharge.  Activity Barriers & Risk Stratification:     Activity Barriers & Cardiac Risk Stratification - 08/24/15 1715    Activity Barriers & Cardiac Risk Stratification   Activity Barriers None   Cardiac Risk Stratification High      6 Minute Walk:     6 Minute Walk      08/24/15 1553       6 Minute Walk   Phase Initial     Distance 1150 feet     Walk Time 6 minutes     # of Rest Breaks 0     MPH 2.18     METS 2.67     RPE 11     Perceived Dyspnea  9     VO2 Peak 6.78     Symptoms No     Resting HR 52 bpm     Resting BP 100/52 mmHg     Max Ex. HR 75 bpm     Max Ex. BP 122/64 mmHg     2 Minute Post BP 98/58 mmHg        Initial Exercise Prescription:     Initial Exercise Prescription - 08/24/15 1600    Date of Initial Exercise RX and Referring Provider   Date 08/24/15   Treadmill   MPH 1.2   Grade 0   Minutes 15   METs 1.92   NuStep   Level 2   Minutes 15   METs 1.5   Prescription Details   Frequency (times per week) 3   Intensity   THRR REST +  30   THRR 40-80% of Max Heartrate 91-130   Ratings of Perceived Exertion 11-13   Progression   Progression Continue to progress workloads to maintain intensity without signs/symptoms of physical distress.   Resistance Training   Training Prescription Yes   Weight 1   Reps 10-12      Perform Capillary Blood Glucose checks as needed.  Exercise Prescription Changes:      Exercise Prescription Changes      09/17/15 1900 10/17/15 1200         Exercise Review   Progression Yes Yes      Response to Exercise   Blood Pressure (Admit) 98/54 mmHg 100/54 mmHg      Blood Pressure (Exercise) 120/60 mmHg 118/60 mmHg      Blood Pressure (Exit) 92/60  mmHg  90/50 mmHg      Heart Rate (Admit) 53 bpm 53 bpm      Heart Rate (Exercise) 81 bpm 66 bpm      Heart Rate (Exit) 68 bpm 64 bpm      Rating of Perceived Exertion (Exercise)  11      Symptoms No No      Duration Progress to 30 minutes of continuous aerobic without signs/symptoms of physical distress Progress to 30 minutes of continuous aerobic without signs/symptoms of physical distress      Intensity  Rest + 30      Progression   Progression Continue to progress workloads to maintain intensity without signs/symptoms of physical distress. Continue to progress workloads to maintain intensity without signs/symptoms of physical distress.      Resistance Training   Training Prescription Yes Yes      Weight 2.0 3.0      Reps 10-12 10-12      Interval Training   Interval Training  No      Treadmill   MPH 2.1 2.2      Grade 0 0      Minutes 15 15      METs 2.61 2.68      NuStep   Level 2 2      Minutes 15 15      METs 3.72 3.72      Home Exercise Plan   Plans to continue exercise at [facility] 2 additional days to program exercise sessions. Add 2 additional days to program exercise sessions.         Exercise Comments:      Exercise Comments      09/17/15 1936 10/17/15 1213         Exercise Comments Patient is progressing appropriately. Patient is progressing appropriately.          Discharge Exercise Prescription (Final Exercise Prescription Changes):     Exercise Prescription Changes - 10/17/15 1200    Exercise Review   Progression Yes   Response to Exercise   Blood Pressure (Admit) 100/54 mmHg   Blood Pressure (Exercise) 118/60 mmHg   Blood Pressure (Exit) 90/50 mmHg   Heart Rate (Admit) 53 bpm   Heart Rate (Exercise) 66 bpm   Heart Rate (Exit) 64 bpm   Rating of Perceived Exertion (Exercise) 11   Symptoms No   Duration Progress to 30 minutes of continuous aerobic without signs/symptoms of physical distress   Intensity Rest + 30   Progression    Progression Continue to progress workloads to maintain intensity without signs/symptoms of physical distress.   Resistance Training   Training Prescription Yes   Weight 3.0   Reps 10-12   Interval Training   Interval Training No   Treadmill   MPH 2.2   Grade 0   Minutes 15   METs 2.68   NuStep   Level 2   Minutes 15   METs 3.72   Home Exercise Plan   Plans to continue exercise at Home   Frequency Add 2 additional days to program exercise sessions.      Nutrition:  Target Goals: Understanding of nutrition guidelines, daily intake of sodium 1500mg , cholesterol 200mg , calories 30% from fat and 7% or less from saturated fats, daily to have 5 or more servings of fruits and vegetables.  Biometrics:     Pre Biometrics - 08/25/15 1403    Pre Biometrics   Height 5'  10" (1.778 m)   Weight 242 lb 8 oz (109.997 kg)   Waist Circumference 45 inches   Hip Circumference 50 inches   Waist to Hip Ratio 0.9 %   BMI (Calculated) 34.9   Triceps Skinfold 15 mm   % Body Fat 32.3 %   Grip Strength 95 kg   Flexibility 0 in   Single Leg Stand 13 seconds       Nutrition Therapy Plan and Nutrition Goals:     Nutrition Therapy & Goals - 08/24/15 1717    Nutrition Therapy   Diet --  Regular diet   Intervention Plan   Intervention Prescribe, educate and counsel regarding individualized specific dietary modifications aiming towards targeted core components such as weight, hypertension, lipid management, diabetes, heart failure and other comorbidities.   Expected Outcomes Short Term Goal: Understand basic principles of dietary content, such as calories, fat, sodium, cholesterol and nutrients.;Long Term Goal: Adherence to prescribed nutrition plan.      Nutrition Discharge: Rate Your Plate Scores:     Nutrition Assessments - 08/24/15 1718    MEDFICTS Scores   Pre Score 25      Nutrition Goals Re-Evaluation:     Nutrition Goals Re-Evaluation      09/17/15 0849            Intervention Plan   Intervention Continue to educate, counsel and set short/long term goals regarding individualized specific personal dietary modifications.          Psychosocial: Target Goals: Acknowledge presence or absence of depression, maximize coping skills, provide positive support system. Participant is able to verbalize types and ability to use techniques and skills needed for reducing stress and depression.  Initial Review & Psychosocial Screening:     Initial Psych Review & Screening - 08/24/15 1723    Initial Review   Current issues with --  None   Family Dynamics   Good Support System? Yes   Barriers   Psychosocial barriers to participate in program There are no identifiable barriers or psychosocial needs.   Screening Interventions   Interventions Encouraged to exercise      Quality of Life Scores:     Quality of Life - 08/25/15 1405    Quality of Life Scores   Health/Function Pre 24.96 %   Socioeconomic Pre 27 %   Psych/Spiritual Pre 27.21 %   Family Pre 27 %   GLOBAL Pre 26.11 %      PHQ-9:     Recent Review Flowsheet Data    Depression screen [facility] 2/9 08/24/2015 05/25/2015 08/05/2014 10/16/2013   Decreased Interest 0 0 0 0   Down, Depressed, Hopeless 0 0 1 0   PHQ - 2 Score 0 0 1 0   Altered sleeping 0 - - -   Tired, decreased energy 0 - - -   Change in appetite 0 - - -   Feeling bad or failure about yourself  0 - - -   Trouble concentrating 0 - - -   Moving slowly or fidgety/restless 0 - - -   Suicidal thoughts 0 - - -   PHQ-9 Score 0 - - -   Difficult doing work/chores Not difficult at all - - -      Psychosocial Evaluation and Intervention:     Psychosocial Evaluation - 08/24/15 1723    Psychosocial Evaluation & Interventions   Comments Patient states he is not depressed and does not need counseling   Continued Psychosocial Services Needed No  Psychosocial Re-Evaluation:     Psychosocial Re-Evaluation      09/17/15 0852            Psychosocial Re-Evaluation   Interventions Encouraged to attend Cardiac Rehabilitation for the exercise       Continued Psychosocial Services Needed No          Vocational Rehabilitation: Provide vocational rehab assistance to qualifying candidates.   Vocational Rehab Evaluation & Intervention:     Vocational Rehab - 08/24/15 1717    Initial Vocational Rehab Evaluation & Intervention   Assessment shows need for Vocational Rehabilitation No      Education: Education Goals: Education classes will be provided on a weekly basis, covering required topics. Participant will state understanding/return demonstration of topics presented.  Learning Barriers/Preferences:     Learning Barriers/Preferences - 08/24/15 1715    Learning Barriers/Preferences   Learning Barriers None   Learning Preferences Group Instruction  Learns by doing      Education Topics: Hypertension, Hypertension Reduction -Define heart disease and high blood pressure. Discus how high blood pressure affects the body and ways to reduce high blood pressure.      CARDIAC REHAB PHASE II EXERCISE from 10/06/2015 in [place]   Date  09/22/15   Educator  Russella Dar   Instruction Review Code  2- meets goals/outcomes      Exercise and Your Heart -Discuss why it is important to exercise, the FITT principles of exercise, normal and abnormal responses to exercise, and how to exercise safely.      CARDIAC REHAB PHASE II EXERCISE from 10/06/2015 in [place]   Date  09/29/15   Educator  DC   Instruction Review Code  2- meets goals/outcomes      Angina -Discuss definition of angina, causes of angina, treatment of angina, and how to decrease risk of having angina.      CARDIAC REHAB PHASE II EXERCISE from 10/06/2015 in [place]   Date  10/06/15   Educator  D Elpidio Thielen   Instruction Review Code  2- meets goals/outcomes      Cardiac  Medications -Review what the following cardiac medications are used for, how they affect the body, and side effects that may occur when taking the medications.  Medications include Aspirin, Beta blockers, calcium channel blockers, ACE Inhibitors, angiotensin receptor blockers, diuretics, digoxin, and antihyperlipidemics.   Congestive Heart Failure -Discuss the definition of CHF, how to live with CHF, the signs and symptoms of CHF, and how keep track of weight and sodium intake.   Heart Disease and Intimacy -Discus the effect sexual activity has on the heart, how changes occur during intimacy as we age, and safety during sexual activity.   Smoking Cessation / COPD -Discuss different methods to quit smoking, the health benefits of quitting smoking, and the definition of COPD.   Nutrition I: Fats -Discuss the types of cholesterol, what cholesterol does to the heart, and how cholesterol levels can be controlled.   Nutrition II: Labels -Discuss the different components of food labels and how to read food label   Heart Parts and Heart Disease -Discuss the anatomy of the heart, the pathway of blood circulation through the heart, and these are affected by heart disease.   Stress I: Signs and Symptoms -Discuss the causes of stress, how stress may lead to anxiety and depression, and ways to limit stress.      CARDIAC REHAB PHASE II EXERCISE from 10/06/2015 in [place]  CARDIAC REHABILITATION   Date  09/01/15   Educator  D Jeyla Bulger   Instruction Review Code  2- meets goals/outcomes      Stress II: Relaxation -Discuss different types of relaxation techniques to limit stress.      CARDIAC REHAB PHASE II EXERCISE from 10/06/2015 in [place]   Date  09/08/15   Educator  D Argentina Kosch   Instruction Review Code  2- meets goals/outcomes      Warning Signs of Stroke / TIA -Discuss definition of a stroke, what the signs and symptoms are of a stroke, and how to identify when  someone is having stroke.          CARDIAC REHAB PHASE II EXERCISE from 10/06/2015 in [facility]   Date  09/15/15   Educator  Russella Dar   Instruction Review Code  2- meets goals/outcomes      Knowledge Questionnaire Score:     Knowledge Questionnaire Score - 08/24/15 1716    Knowledge Questionnaire Score   Pre Score 22/24      Core Components/Risk Factors/Patient Goals at Admission:     Personal Goals and Risk Factors at Admission - 08/24/15 1719    Core Components/Risk Factors/Patient Goals on Admission    Weight Management Yes   Admit Weight 242 lb 8 oz (109.997 kg)   Goal Weight: Short Term 237 lb 8 oz (107.729 kg)   Goal Weight: Long Term 235 lb (106.595 kg)   Expected Outcomes Short Term: Continue to assess and modify interventions until short term weight is achieved   Sedentary --  No, very activie   Increase Strength and Stamina Yes   Intervention Provide advice, education, support and counseling about physical activity/exercise needs.;Develop an individualized exercise prescription for aerobic and resistive training based on initial evaluation findings, risk stratification, comorbidities and participant's personal goals.   Tobacco Cessation --  None needed   Diabetes --  Patient states he already has dieabetes under control.    Personal Goal Other Yes   Personal Goal More strength   Intervention More stamina   Expected Outcomes Lose 5-7lbs, Be able to continue to play golf      Core Components/Risk Factors/Patient Goals Review:      Goals and Risk Factor Review      09/17/15 0849 10/08/15 1635         Core Components/Risk Factors/Patient Goals Review   Personal Goals Review Weight Management/Obesity;Increase Strength and Stamina Weight Management/Obesity;Increase Strength and Stamina      Review patient has not showed any changes at this time. Will continue to education and progress patient as needed.  Will continue to educate and  progress patient as needed. patient has gained approx 4 lbs since started program.  Patient also educated on diet.  Patient states he does feel a little stronger.       Expected Outcomes wight loss and increased energy wight loss and increased energy         Core Components/Risk Factors/Patient Goals at Discharge (Final Review):      Goals and Risk Factor Review - 10/08/15 1635    Core Components/Risk Factors/Patient Goals Review   Personal Goals Review Weight Management/Obesity;Increase Strength and Stamina   Review Will continue to educate and progress patient as needed. patient has gained approx 4 lbs since started program.  Patient also educated on diet.  Patient states he does feel a little stronger.    Expected Outcomes wight loss and increased energy  ITP Comments:   Comments: Patient is doing well in the program. We will continue to monitor for progress.

## 2015-10-18 ENCOUNTER — Encounter (HOSPITAL_COMMUNITY): Payer: Medicare Other

## 2015-10-20 ENCOUNTER — Encounter (HOSPITAL_COMMUNITY): Payer: Medicare Other

## 2015-10-20 DIAGNOSIS — C44329 Squamous cell carcinoma of skin of other parts of face: Secondary | ICD-10-CM | POA: Diagnosis not present

## 2015-10-20 DIAGNOSIS — Z950 Presence of cardiac pacemaker: Secondary | ICD-10-CM | POA: Diagnosis not present

## 2015-10-20 DIAGNOSIS — F17290 Nicotine dependence, other tobacco product, uncomplicated: Secondary | ICD-10-CM | POA: Diagnosis not present

## 2015-10-20 DIAGNOSIS — Z7901 Long term (current) use of anticoagulants: Secondary | ICD-10-CM | POA: Diagnosis not present

## 2015-10-20 DIAGNOSIS — Z85828 Personal history of other malignant neoplasm of skin: Secondary | ICD-10-CM | POA: Diagnosis not present

## 2015-10-20 DIAGNOSIS — I4891 Unspecified atrial fibrillation: Secondary | ICD-10-CM | POA: Diagnosis not present

## 2015-10-22 ENCOUNTER — Encounter (HOSPITAL_COMMUNITY)
Admission: RE | Admit: 2015-10-22 | Discharge: 2015-10-22 | Disposition: A | Discharge status: Home / Self Care | Payer: Medicare Other | Source: Ambulatory Visit | Attending: Cardiology | Admitting: Cardiology

## 2015-10-22 DIAGNOSIS — Z8673 Personal history of transient ischemic attack (TIA), and cerebral infarction without residual deficits: Secondary | ICD-10-CM | POA: Insufficient documentation

## 2015-10-22 DIAGNOSIS — K219 Gastro-esophageal reflux disease without esophagitis: Secondary | ICD-10-CM | POA: Diagnosis not present

## 2015-10-22 DIAGNOSIS — I110 Hypertensive heart disease with heart failure: Secondary | ICD-10-CM | POA: Insufficient documentation

## 2015-10-22 DIAGNOSIS — E785 Hyperlipidemia, unspecified: Secondary | ICD-10-CM | POA: Diagnosis not present

## 2015-10-22 DIAGNOSIS — Z79899 Other long term (current) drug therapy: Secondary | ICD-10-CM | POA: Diagnosis not present

## 2015-10-22 DIAGNOSIS — I252 Old myocardial infarction: Secondary | ICD-10-CM | POA: Insufficient documentation

## 2015-10-22 DIAGNOSIS — I5022 Chronic systolic (congestive) heart failure: Secondary | ICD-10-CM | POA: Insufficient documentation

## 2015-10-22 NOTE — Progress Notes (Signed)
Daily Session Note  Patient Details  Name: Edward Heath MRN: [id number] Date of Birth: 26-Jan-1946 Referring Provider:    Encounter Date: 10/22/2015  Check In:     Session Check In - 10/22/15 1530    Check-In   Location [facility]   Staff Present Diane Angelina Pih, MS, EP, [facility], Exercise Physiologist;Lorita Forinash Oletta Lamas, RN, BSN   Supervising physician immediately available to respond to emergencies See telemetry face sheet for immediately available MD   Medication changes reported     No   Fall or balance concerns reported    No   Warm-up and Cool-down Performed as group-led instruction   Resistance Training Performed Yes   VAD Patient? No   Pain Assessment   Currently in Pain? No/denies      Capillary Blood Glucose: No results found for this or any previous visit (from the past 24 hour(s)).   Goals Met:  Independence with exercise equipment Exercise tolerated well No report of cardiac concerns or symptoms Strength training completed today  Goals Unmet:  Not Applicable  Comments: check out 445   Dr. Kate Sable is Medical Director for [place] and Pulmonary Rehab.

## 2015-10-25 ENCOUNTER — Encounter (HOSPITAL_COMMUNITY)
Admission: RE | Admit: 2015-10-25 | Discharge: 2015-10-25 | Disposition: A | Discharge status: Home / Self Care | Payer: Medicare Other | Source: Ambulatory Visit | Attending: Cardiology | Admitting: Cardiology

## 2015-10-25 DIAGNOSIS — I5022 Chronic systolic (congestive) heart failure: Secondary | ICD-10-CM | POA: Diagnosis not present

## 2015-10-25 DIAGNOSIS — Z79899 Other long term (current) drug therapy: Secondary | ICD-10-CM | POA: Diagnosis not present

## 2015-10-25 DIAGNOSIS — Z8673 Personal history of transient ischemic attack (TIA), and cerebral infarction without residual deficits: Secondary | ICD-10-CM | POA: Diagnosis not present

## 2015-10-25 DIAGNOSIS — K219 Gastro-esophageal reflux disease without esophagitis: Secondary | ICD-10-CM | POA: Diagnosis not present

## 2015-10-25 DIAGNOSIS — E785 Hyperlipidemia, unspecified: Secondary | ICD-10-CM | POA: Diagnosis not present

## 2015-10-25 DIAGNOSIS — I110 Hypertensive heart disease with heart failure: Secondary | ICD-10-CM | POA: Diagnosis not present

## 2015-10-25 NOTE — Progress Notes (Signed)
Daily Session Note  Patient Details  Name: Edward Heath MRN: [id number] Date of Birth: [birth year] Referring Provider:    Encounter Date: 10/25/2015  Check In:     Session Check In - 10/25/15 0930    Check-In   Location [facility]   Staff Present Pinki Rottman Angelina Pih, MS, EP, [facility], Exercise Physiologist;Christy Oletta Lamas, RN, BSN  Nils Flack, EP   Supervising physician immediately available to respond to emergencies See telemetry face sheet for immediately available MD   Medication changes reported     No   Fall or balance concerns reported    No   Warm-up and Cool-down Performed as group-led instruction   Resistance Training Performed Yes   VAD Patient? No   Pain Assessment   Currently in Pain? No/denies   Pain Score 0-No pain   Multiple Pain Sites No      Capillary Blood Glucose: No results found for this or any previous visit (from the past 24 hour(s)).   Goals Met:  Independence with exercise equipment Exercise tolerated well No report of cardiac concerns or symptoms Strength training completed today  Goals Unmet:  Not Applicable  Comments: Check out [phone]   Dr. Kate Sable is Medical Director for [place] and Pulmonary Rehab.

## 2015-10-26 DIAGNOSIS — C44329 Squamous cell carcinoma of skin of other parts of face: Secondary | ICD-10-CM | POA: Insufficient documentation

## 2015-10-27 ENCOUNTER — Encounter (HOSPITAL_COMMUNITY)
Admission: RE | Admit: 2015-10-27 | Discharge: 2015-10-27 | Disposition: A | Discharge status: Home / Self Care | Payer: Medicare Other | Source: Ambulatory Visit | Attending: Cardiology | Admitting: Cardiology

## 2015-10-27 DIAGNOSIS — K219 Gastro-esophageal reflux disease without esophagitis: Secondary | ICD-10-CM | POA: Diagnosis not present

## 2015-10-27 DIAGNOSIS — I5022 Chronic systolic (congestive) heart failure: Secondary | ICD-10-CM | POA: Diagnosis not present

## 2015-10-27 DIAGNOSIS — Z79899 Other long term (current) drug therapy: Secondary | ICD-10-CM | POA: Diagnosis not present

## 2015-10-27 DIAGNOSIS — E785 Hyperlipidemia, unspecified: Secondary | ICD-10-CM | POA: Diagnosis not present

## 2015-10-27 DIAGNOSIS — Z8673 Personal history of transient ischemic attack (TIA), and cerebral infarction without residual deficits: Secondary | ICD-10-CM | POA: Diagnosis not present

## 2015-10-27 DIAGNOSIS — I110 Hypertensive heart disease with heart failure: Secondary | ICD-10-CM | POA: Diagnosis not present

## 2015-10-27 NOTE — Progress Notes (Signed)
Daily Session Note  Patient Details  Name: Edward Heath MRN: [id number] Date of Birth: [birth year] Referring Provider:    Encounter Date: 10/27/2015  Check In:     Session Check In - 10/27/15 1545    Check-In   Location [facility]   Staff Present Terran Hollenkamp Angelina Pih, MS, EP, [facility], Exercise Physiologist;Christy Oletta Lamas, RN, BSN  Milda Smart   Supervising physician immediately available to respond to emergencies See telemetry face sheet for immediately available MD   Medication changes reported     No   Fall or balance concerns reported    No   Warm-up and Cool-down Performed as group-led instruction   Resistance Training Performed Yes   VAD Patient? No   Pain Assessment   Currently in Pain? No/denies   Pain Score 0-No pain   Multiple Pain Sites No      Capillary Blood Glucose: No results found for this or any previous visit (from the past 24 hour(s)).      Exercise Prescription Changes - 10/27/15 1400    Exercise Review   Progression Yes   Response to Exercise   Blood Pressure (Admit) 108/70 mmHg   Blood Pressure (Exercise) 122/60 mmHg   Blood Pressure (Exit) 110/56 mmHg   Heart Rate (Admit) 60 bpm   Heart Rate (Exercise) 84 bpm   Heart Rate (Exit) 69 bpm   Rating of Perceived Exertion (Exercise) 11   Symptoms No   Duration Progress to 30 minutes of continuous aerobic without signs/symptoms of physical distress   Intensity Rest + 30   Progression   Progression Continue to progress workloads to maintain intensity without signs/symptoms of physical distress.   Resistance Training   Training Prescription Yes   Weight 3.0   Reps 10-12   Interval Training   Interval Training No   Treadmill   MPH 2.3   Grade 0   Minutes 15   METs 2.68   NuStep   Level 3   Minutes 15   METs 3.72   Home Exercise Plan   Plans to continue exercise at Home   Frequency Add 2 additional days to program exercise sessions.     Goals Met:  Independence with exercise  equipment Exercise tolerated well No report of cardiac concerns or symptoms Strength training completed today  Goals Unmet:  Not Applicable  Comments: Check out: [place]   Dr. Kate Sable is Medical Director for [place] and Pulmonary Rehab.

## 2015-10-29 ENCOUNTER — Encounter (HOSPITAL_COMMUNITY)
Admission: RE | Admit: 2015-10-29 | Discharge: 2015-10-29 | Disposition: A | Discharge status: Home / Self Care | Payer: Medicare Other | Source: Ambulatory Visit | Attending: Cardiology | Admitting: Cardiology

## 2015-10-29 DIAGNOSIS — I110 Hypertensive heart disease with heart failure: Secondary | ICD-10-CM | POA: Diagnosis not present

## 2015-10-29 DIAGNOSIS — Z79899 Other long term (current) drug therapy: Secondary | ICD-10-CM | POA: Diagnosis not present

## 2015-10-29 DIAGNOSIS — Z8673 Personal history of transient ischemic attack (TIA), and cerebral infarction without residual deficits: Secondary | ICD-10-CM | POA: Diagnosis not present

## 2015-10-29 DIAGNOSIS — E785 Hyperlipidemia, unspecified: Secondary | ICD-10-CM | POA: Diagnosis not present

## 2015-10-29 DIAGNOSIS — I5022 Chronic systolic (congestive) heart failure: Secondary | ICD-10-CM | POA: Diagnosis not present

## 2015-10-29 DIAGNOSIS — K219 Gastro-esophageal reflux disease without esophagitis: Secondary | ICD-10-CM | POA: Diagnosis not present

## 2015-10-29 NOTE — Progress Notes (Signed)
Daily Session Note  Patient Details  Name: Edward Heath MRN: [id number] Date of Birth: February 08, 1946 Referring Provider:    Encounter Date: 10/29/2015  Check In:     Session Check In - 10/29/15 1545    Check-In   Location [facility]   Staff Present Diane Angelina Pih, MS, EP, [facility], [facility], [facility], Exercise Physiologist;Vernita Tague Luther Parody, BS, EP, Exercise Physiologist;Christy Oletta Lamas, RN, BSN   Supervising physician immediately available to respond to emergencies See telemetry face sheet for immediately available MD   Medication changes reported     No   Fall or balance concerns reported    No   Warm-up and Cool-down Performed as group-led instruction   Resistance Training Performed Yes   VAD Patient? No   Pain Assessment   Currently in Pain? No/denies   Pain Score 0-No pain   Multiple Pain Sites No      Capillary Blood Glucose: No results found for this or any previous visit (from the past 24 hour(s)).   Goals Met:  Independence with exercise equipment Exercise tolerated well No report of cardiac concerns or symptoms Strength training completed today  Goals Unmet:  Not Applicable  Comments: Check out 445   Dr. Kate Sable is Medical Director for [place] and Pulmonary Rehab.

## 2015-11-01 ENCOUNTER — Encounter (HOSPITAL_COMMUNITY)
Admission: RE | Admit: 2015-11-01 | Discharge: 2015-11-01 | Disposition: A | Discharge status: Home / Self Care | Payer: Medicare Other | Source: Ambulatory Visit | Attending: Cardiology | Admitting: Cardiology

## 2015-11-01 DIAGNOSIS — Z79899 Other long term (current) drug therapy: Secondary | ICD-10-CM | POA: Diagnosis not present

## 2015-11-01 DIAGNOSIS — Z8673 Personal history of transient ischemic attack (TIA), and cerebral infarction without residual deficits: Secondary | ICD-10-CM | POA: Diagnosis not present

## 2015-11-01 DIAGNOSIS — I5022 Chronic systolic (congestive) heart failure: Secondary | ICD-10-CM | POA: Diagnosis not present

## 2015-11-01 DIAGNOSIS — I110 Hypertensive heart disease with heart failure: Secondary | ICD-10-CM | POA: Diagnosis not present

## 2015-11-01 DIAGNOSIS — K219 Gastro-esophageal reflux disease without esophagitis: Secondary | ICD-10-CM | POA: Diagnosis not present

## 2015-11-01 DIAGNOSIS — E785 Hyperlipidemia, unspecified: Secondary | ICD-10-CM | POA: Diagnosis not present

## 2015-11-01 NOTE — Progress Notes (Signed)
Daily Session Note  Patient Details  Name: Edward Heath MRN: [id number] Date of Birth: [birth year] Referring Provider:    Encounter Date: 11/01/2015  Check In:     Session Check In - 11/01/15 0930    Check-In   Location [facility]   Staff Present Diane Angelina Pih, MS, EP, [facility], Exercise Physiologist;Noha Karasik Luther Parody, BS, EP, Exercise Physiologist;Christy Oletta Lamas, RN, BSN   Supervising physician immediately available to respond to emergencies See telemetry face sheet for immediately available MD   Medication changes reported     No   Fall or balance concerns reported    No   Warm-up and Cool-down Performed as group-led instruction   Resistance Training Performed Yes   VAD Patient? No   Pain Assessment   Currently in Pain? No/denies   Pain Score 0-No pain   Multiple Pain Sites No      Capillary Blood Glucose: No results found for this or any previous visit (from the past 24 hour(s)).   Goals Met:  Independence with exercise equipment Exercise tolerated well No report of cardiac concerns or symptoms Strength training completed today  Goals Unmet:  Not Applicable  Comments: Check out 1030   Dr. Kate Sable is Medical Director for [place] and Pulmonary Rehab.

## 2015-11-03 ENCOUNTER — Encounter (HOSPITAL_COMMUNITY)
Admission: RE | Admit: 2015-11-03 | Discharge: 2015-11-03 | Disposition: A | Discharge status: Home / Self Care | Payer: Medicare Other | Source: Ambulatory Visit | Attending: Cardiology | Admitting: Cardiology

## 2015-11-03 DIAGNOSIS — E785 Hyperlipidemia, unspecified: Secondary | ICD-10-CM | POA: Diagnosis not present

## 2015-11-03 DIAGNOSIS — K219 Gastro-esophageal reflux disease without esophagitis: Secondary | ICD-10-CM | POA: Diagnosis not present

## 2015-11-03 DIAGNOSIS — I5022 Chronic systolic (congestive) heart failure: Secondary | ICD-10-CM | POA: Diagnosis not present

## 2015-11-03 DIAGNOSIS — Z79899 Other long term (current) drug therapy: Secondary | ICD-10-CM | POA: Diagnosis not present

## 2015-11-03 DIAGNOSIS — Z8673 Personal history of transient ischemic attack (TIA), and cerebral infarction without residual deficits: Secondary | ICD-10-CM | POA: Diagnosis not present

## 2015-11-03 DIAGNOSIS — I110 Hypertensive heart disease with heart failure: Secondary | ICD-10-CM | POA: Diagnosis not present

## 2015-11-03 NOTE — Progress Notes (Signed)
Daily Session Note  Patient Details  Name: Edward Heath MRN: [id number] Date of Birth: Nov 30, 1945 Referring Provider:    Encounter Date: 11/03/2015  Check In:     Session Check In - 11/03/15 1545    Check-In   Location [facility]   Staff Present Diane Angelina Pih, MS, EP, [facility], Exercise Physiologist;Betzabe Bevans Luther Parody, BS, EP, Exercise Physiologist;Christy Oletta Lamas, RN, BSN   Supervising physician immediately available to respond to emergencies See telemetry face sheet for immediately available MD   Medication changes reported     No   Fall or balance concerns reported    No   Warm-up and Cool-down Performed as group-led instruction   Resistance Training Performed Yes   VAD Patient? No   Pain Assessment   Currently in Pain? No/denies   Pain Score 0-No pain   Multiple Pain Sites No      Capillary Blood Glucose: No results found for this or any previous visit (from the past 24 hour(s)).      Exercise Prescription Changes - 11/03/15 1300    Exercise Review   Progression Yes   Response to Exercise   Blood Pressure (Admit) 108/70 mmHg   Blood Pressure (Exercise) 122/60 mmHg   Blood Pressure (Exit) 110/56 mmHg   Heart Rate (Admit) 60 bpm   Heart Rate (Exercise) 84 bpm   Heart Rate (Exit) 69 bpm   Rating of Perceived Exertion (Exercise) 11   Symptoms No   Duration Progress to 30 minutes of continuous aerobic without signs/symptoms of physical distress   Intensity Rest + 30   Progression   Progression Continue to progress workloads to maintain intensity without signs/symptoms of physical distress.   Resistance Training   Training Prescription Yes   Weight 3.0   Reps 10-12   Interval Training   Interval Training No   Treadmill   MPH 2.7   Grade 0   Minutes 15   METs 2.9   NuStep   Level 3   Minutes 15   METs 3.72   Home Exercise Plan   Plans to continue exercise at Home   Frequency Add 2 additional days to program exercise sessions.     Goals Met:   Independence with exercise equipment Exercise tolerated well No report of cardiac concerns or symptoms  Goals Unmet:  Not Applicable  Comments: Check out 445   Dr. Kate Sable is Medical Director for [place] and Pulmonary Rehab.

## 2015-11-05 ENCOUNTER — Encounter (HOSPITAL_COMMUNITY): Payer: Medicare Other

## 2015-11-08 ENCOUNTER — Encounter (HOSPITAL_COMMUNITY): Payer: Medicare Other

## 2015-11-10 ENCOUNTER — Encounter (HOSPITAL_COMMUNITY): Payer: Medicare Other

## 2015-11-12 ENCOUNTER — Encounter (HOSPITAL_COMMUNITY): Payer: Medicare Other

## 2015-11-15 ENCOUNTER — Encounter (HOSPITAL_COMMUNITY): Payer: Medicare Other

## 2015-11-17 ENCOUNTER — Encounter (HOSPITAL_COMMUNITY): Payer: Medicare Other

## 2015-11-17 DIAGNOSIS — Z85828 Personal history of other malignant neoplasm of skin: Secondary | ICD-10-CM | POA: Diagnosis not present

## 2015-11-17 DIAGNOSIS — Z95810 Presence of automatic (implantable) cardiac defibrillator: Secondary | ICD-10-CM | POA: Diagnosis not present

## 2015-11-17 DIAGNOSIS — Z720 Tobacco use: Secondary | ICD-10-CM | POA: Diagnosis not present

## 2015-11-17 DIAGNOSIS — L309 Dermatitis, unspecified: Secondary | ICD-10-CM | POA: Diagnosis not present

## 2015-11-17 DIAGNOSIS — L308 Other specified dermatitis: Secondary | ICD-10-CM | POA: Diagnosis not present

## 2015-11-17 DIAGNOSIS — C44329 Squamous cell carcinoma of skin of other parts of face: Secondary | ICD-10-CM | POA: Diagnosis not present

## 2015-11-17 DIAGNOSIS — D485 Neoplasm of uncertain behavior of skin: Secondary | ICD-10-CM | POA: Diagnosis not present

## 2015-11-17 DIAGNOSIS — Z7901 Long term (current) use of anticoagulants: Secondary | ICD-10-CM | POA: Diagnosis not present

## 2015-11-19 ENCOUNTER — Encounter (HOSPITAL_COMMUNITY): Payer: Medicare Other

## 2015-11-30 DIAGNOSIS — C44329 Squamous cell carcinoma of skin of other parts of face: Secondary | ICD-10-CM | POA: Diagnosis not present

## 2015-11-30 DIAGNOSIS — Z95810 Presence of automatic (implantable) cardiac defibrillator: Secondary | ICD-10-CM | POA: Diagnosis not present

## 2015-12-09 DIAGNOSIS — Z4802 Encounter for removal of sutures: Secondary | ICD-10-CM | POA: Diagnosis not present

## 2015-12-09 DIAGNOSIS — T8130XA Disruption of wound, unspecified, initial encounter: Secondary | ICD-10-CM | POA: Diagnosis not present

## 2015-12-09 DIAGNOSIS — Z483 Aftercare following surgery for neoplasm: Secondary | ICD-10-CM | POA: Diagnosis not present

## 2015-12-14 ENCOUNTER — Encounter: Payer: Self-pay | Admitting: Pharmacist

## 2015-12-14 ENCOUNTER — Ambulatory Visit (INDEPENDENT_AMBULATORY_CARE_PROVIDER_SITE_OTHER): Payer: Medicare Other | Admitting: Pharmacist

## 2015-12-14 VITALS — BP 102/60 | HR 60 | Ht 68.0 in | Wt 251.0 lb

## 2015-12-14 DIAGNOSIS — R29890 Loss of height: Secondary | ICD-10-CM

## 2015-12-14 DIAGNOSIS — E785 Hyperlipidemia, unspecified: Secondary | ICD-10-CM | POA: Insufficient documentation

## 2015-12-14 DIAGNOSIS — I429 Cardiomyopathy, unspecified: Secondary | ICD-10-CM | POA: Insufficient documentation

## 2015-12-14 DIAGNOSIS — E1159 Type 2 diabetes mellitus with other circulatory complications: Secondary | ICD-10-CM

## 2015-12-14 DIAGNOSIS — E119 Type 2 diabetes mellitus without complications: Secondary | ICD-10-CM | POA: Insufficient documentation

## 2015-12-14 DIAGNOSIS — Z0000 Encounter for general adult medical examination without abnormal findings: Secondary | ICD-10-CM

## 2015-12-14 NOTE — Progress Notes (Signed)
Subjective:   Edward Heath is a 70 y.o. male who presents for a  Subsequent Medicare Annual Wellness Visit.   He is married and lives with his wife Edward Heath in [place], [place].   He is retired from [employer] and gets a large portion of his health care at [place].    Review of Systems  Review of Systems  Constitutional: Negative.   HENT: Positive for hearing loss (patient has hearing aids but doesn't wear.  He is responding to questioning in office today without problems and at normal speaking level).   Eyes: Negative.   Respiratory: Negative.   Cardiovascular: Negative.   Gastrointestinal: Positive for heartburn (controlled with ppi).  Genitourinary: Negative.   Musculoskeletal: Positive for back pain and joint pain.  Skin: Negative.   Neurological: Negative.   Psychiatric/Behavioral: The patient has insomnia.      Current Medications (verified) Outpatient Encounter Prescriptions as of 12/14/2015  Medication Sig  . amiodarone (PACERONE) 200 MG tablet Take 1 tablet (200 mg total) by mouth daily.  Marland Kitchen apixaban (ELIQUIS) 5 MG TABS tablet Take 1 tablet (5 mg total) by mouth 2 (two) times daily. 2 BID X 7 days then 1 BID  . aspirin EC 81 MG tablet Take 81 mg by mouth.  Marland Kitchen atorvastatin (LIPITOR) 40 MG tablet Take 40 mg by mouth daily.    . celecoxib (CELEBREX) 200 MG capsule Take 1 capsule (200 mg total) by mouth daily. With food (Patient taking differently: Take 200 mg by mouth daily as needed for moderate pain (Pt takes several times per week per MD instructions but not daily). With food)  . Cholecalciferol (VITAMIN D-1000 MAX ST) 1000 units tablet Take 1 tablet (1,000 Units total) by mouth 2 (two) times daily.  . cyanocobalamin 1000 MCG tablet Take 1,000 mcg by mouth daily.  Marland Kitchen docusate (COLACE) 60 MG/15ML syrup Take 60 mg by mouth daily.    . ferrous sulfate 325 (65 FE) MG tablet Take 325 mg by mouth.  . furosemide (LASIX) 20 MG tablet TAKE (2) TABLETS DAILY (Patient taking differently: take 1  (20mg ) tablet daily)  . gabapentin (NEURONTIN) 300 MG capsule Take 300 mg by mouth 2 (two) times daily.  Marland Kitchen glipiZIDE (GLUCOTROL) 5 MG tablet Take 5 mg by mouth daily before breakfast.   . lisinopril (PRINIVIL,ZESTRIL) 5 MG tablet Take 5 mg by mouth daily.    . Magnesium 250 MG TABS Take 500 mg by mouth 2 (two) times daily.   . metFORMIN (GLUCOPHAGE) 1000 MG tablet Take 1,000 mg by mouth 2 (two) times daily with a meal.   . metoprolol succinate (TOPROL-XL) 100 MG 24 hr tablet Take 1 tablet (100 mg total) by mouth daily. Take with or immediately following a meal.  . Omega-3 1000 MG CAPS Take 2 g by mouth 2 (two) times daily.   Marland Kitchen omeprazole (PRILOSEC) 10 MG capsule Take 20 mg by mouth 2 (two) times daily.   . Potassium 95 MG TBCR Take 1 tablet by mouth 2 (two) times daily.   . Salicylic Acid 2 % CREA Apply 1 application topically 2 (two) times daily as needed.  . traMADol (ULTRAM) 50 MG tablet Take 50 mg by mouth 2 (two) times daily.  . vitamin E (E-400) 400 UNIT capsule Take 400 Units by mouth.  . nitroGLYCERIN (NITROSTAT) 0.4 MG SL tablet Place 0.4 mg under the tongue.   No facility-administered encounter medications on file as of 12/14/2015.     Allergies (verified) Review of patient's  allergies indicates no known allergies.   History: Past Medical History:  Diagnosis Date  . CHF (congestive heart failure) ([place])   . Diabetes mellitus without complication ([place])   . GERD (gastroesophageal reflux disease)   . Heart attack ([place]) 2004, 2015  . Hyperlipidemia   . Hypertension   . Lumbar vertebral fracture ([place]) 2012  . Skin cancer 06/2014   left cheek  . Stroke ([place])   . TIA (transient ischemic attack) 2009   Past Surgical History:  Procedure Laterality Date  . CARDIAC DEFIBRILLATOR PLACEMENT  12/2003, Repaired in 2012  . KNEE SURGERY Right 1994  . SKIN CANCER EXCISION  06/2014   Family History  Problem Relation Age of Onset  . Heart attack Mother   . Aneurysm Father   . Heart  attack Brother 42   Social History   Occupational History  . Not on file.   Social History Main Topics  . Smoking status: Never Smoker  . Smokeless tobacco: Current User    Types: Chew  . Alcohol use Yes     Comment: occ  . Drug use: No  . Sexual activity: Yes    Do you feel safe at home?  Yes Are there smokers in your home (other than you)? Yes  Dietary issues and exercise activities discussed: Current Exercise Habits: The patient does not participate in regular exercise at present, Exercise limited by: orthopedic condition(s)  Current Dietary habits:  Not following any particular diet currently Cardiac Risk Factors include: advanced age (>75men, >60 women);diabetes mellitus;dyslipidemia;family history of premature cardiovascular disease;male gender;hypertension;obesity (BMI >30kg/m2);sedentary lifestyle;smoking/ tobacco exposure  Objective:    Today's Vitals   12/14/15 1004  BP: 102/60  Pulse: 60  Weight: 251 lb (113.9 kg)  Height: 5\' 8"  (1.727 m)  PainSc: 4   PainLoc: Knee   Max Adult Height = 6\' 0"  Body mass index is 38.16 kg/m.   Activities of Daily Living In your present state of health, do you have any difficulty performing the following activities: 12/14/2015  Hearing? N  Vision? N  Difficulty concentrating or making decisions? N  Walking or climbing stairs? N  Dressing or bathing? N  Doing errands, shopping? N  Preparing Food and eating ? N  Using the Toilet? N  In the past six months, have you accidently leaked urine? N  Do you have problems with loss of bowel control? N  Managing your Medications? N  Managing your Finances? N  Housekeeping or managing your Housekeeping? N  Some recent data might be hidden     Depression Screen PHQ 2/9 Scores 12/14/2015 08/24/2015 05/25/2015 08/05/2014  PHQ - 2 Score 1 0 0 1  PHQ- 9 Score - 0 - -  Exception Documentation - (No Data) - -     Fall Risk Fall Risk  12/14/2015 08/24/2015 05/25/2015 08/05/2014 10/16/2013    Falls in the past year? No No No Yes Yes  Number falls in past yr: - - - 1 1  Injury with Fall? - - - No Yes  Risk for fall due to : - (No Data) - History of fall(s) -  Risk for fall due to (comments): - None - - -  Follow up - - - Education provided;Falls prevention discussed -    Cognitive Function: MMSE - Mini Mental State Exam 12/14/2015 08/05/2014  Orientation to time 5 5  Orientation to Place 5 5  Registration 3 3  Attention/ Calculation 4 4  Recall 2 2  Language- name 2  objects 2 2  Language- repeat 1 1  Language- follow 3 step command 3 3  Language- read & follow direction 1 1  Write a sentence 1 1  Copy design 0 1  Total score 27 28    Immunizations and Health Maintenance Immunization History  Administered Date(s) Administered  . Influenza, High Dose Seasonal PF 03/06/2013, 01/26/2015  . Influenza-Unspecified 03/22/2014  . Pneumococcal Conjugate-13 03/22/2014  . Pneumococcal Polysaccharide-23 03/22/2013  . Zoster 11/19/2013   Health Maintenance Due  Topic Date Due  . Hepatitis C Screening  26-Oct-1945  . FOOT EXAM  12/10/1954  . OPHTHALMOLOGY EXAM  12/10/1954  . TETANUS/TDAP  12/10/1963  . COLON CANCER SCREENING ANNUAL FOBT  12/10/1994  . COLONOSCOPY  12/10/1994    Patient Care Team: Claretta Fraise, MD as PCP - General (Family Medicine) Eileen Stanford, MD as Consulting Physician (Cardiology) Delon Sacramento, MD as Consulting Physician (Dermatology) Amy Carles Collet, MD as Consulting Physician (Dermatology) Ahmed Elson Areas, MD as Consulting Physician (Urology)   [facility]  Indicate any recent Medical Services you may have received from other than [facility] providers in the past year (date may be approximate).    Assessment:    Annual Wellness Visit  Loss of height greater than 2 inches Insomnia / sleeplessness   Screening Tests Health Maintenance  Topic Date Due  . Hepatitis C Screening  January 05, 1946  .  FOOT EXAM  12/10/1954  . OPHTHALMOLOGY EXAM  12/10/1954  . TETANUS/TDAP  12/10/1963  . COLON CANCER SCREENING ANNUAL FOBT  12/10/1994  . COLONOSCOPY  12/10/1994  . INFLUENZA VACCINE  12/21/2015  . HEMOGLOBIN A1C  02/16/2016  . ZOSTAVAX  Completed  . PNA vac Low Risk Adult  Completed        Plan:   During the course of the visit [place] was educated and counseled about the following appropriate screening and preventive services:   Vaccines to include Pneumoccal, Influenza,  Td, Zostavax - no record for Tdap on file.  Patient believes he has received at [place].  Note made for him to check and is has not will either get through [place] or our office.  Colorectal cancer screening - [facility] is working on getting appt  Cardiovascular disease screening - UTD  Diabetes - last A1c was 7.2%  Bone Denisty / Osteoporosis Screening -recommended BMD since patient has lost more than 2 inches in height   Glaucoma screening / Diabetic Eye Exam - UTD  Nutrition counseling - discussed limiting high CHO containing foods and what recommended serving sizes are.  Advanced Directives - declined  Physical Activity - encouraged to do exercises he learned at [employer].  Important to continue to move as much as possible.  Goal is 150 minutes per week but encouraged patient to do what he could.  Try melatonin 3-5mg  prior to sleep  Orders Placed This Encounter  Procedures  . DG Bone Density    Order Specific Question:   Reason for Exam (SYMPTOM  OR DIAGNOSIS REQUIRED)    Answer:   loss of greater than 2 inches    Order Specific Question:   Preferred imaging location?    Answer:   Internal  . Basic metabolic panel    This external order was created through the Results Console.  . Hepatic function panel    This external order was created through the Results Console.  . Hemoglobin A1c    This external order was created through the Results Console.  Patient Instructions (the written plan) were given to the  patient.   Cherre Robins, PharmD   12/14/2015

## 2015-12-14 NOTE — Patient Instructions (Addendum)
[name] , Thank you for taking time to come for your Medicare Wellness Visit. I appreciate your ongoing commitment to your health goals. Please review the following plan we discussed and let me know if I can assist you in the future.   These are the goals we discussed:  Physical Activity goal is to exercise 150 minutes per week.  Try melatonin 3 to 5 mg = take prior to sleep as needed   Increase non-starchy vegetables - carrots, green bean, squash, zucchini, tomatoes, onions, peppers, spinach and other green leafy vegetables, cabbage, lettuce, cucumbers, asparagus, okra (not fried), eggplant Limit sugar and processed foods (cakes, cookies, ice cream, crackers and chips) Increase fresh fruit but limit serving sizes 1/2 cup or about the size of tennis or baseball Limit red meat to no more than 1-2 times per week (serving size about the size of your palm) Choose whole grains / lean proteins - whole wheat bread, quinoa, whole grain rice (1/2 cup), fish, chicken, [place] Avoid sugar and calorie containing beverages - soda, sweet tea and juice.  Choose water or unsweetened tea instead.    This is a list of the screening recommended for you and due dates:  Health Maintenance  Topic Date Due   DEXA - bone density Due now - check with [facility] to see if they can do if not we can do at our office.  .  Hepatitis C: One time screening is recommended by Center for Disease Control  (CDC) for  adults born from [birth year] through 1966.   May 11, 1946 -check with [facility] to see if they have checked this  . Tetanus Vaccine  12/10/1963 -check with [facility] to see if given  . Colon Cancer Screening  12/10/1994 - [facility] is scheduling this  . Flu Shot  12/21/2015  . Shingles Vaccine  Completed  . Pneumonia vaccines  Completed    Health Maintenance, Male A healthy lifestyle and preventative care can promote health and wellness.  Maintain regular health, dental, and eye exams.  Eat a healthy diet. Foods like vegetables, fruits,  whole grains, low-fat dairy products, and lean protein foods contain the nutrients you need and are low in calories. Decrease your intake of foods high in solid fats, added sugars, and salt. Get information about a proper diet from your health care provider, if necessary.  Regular physical exercise is one of the most important things you can do for your health. Most adults should get at least 150 minutes of moderate-intensity exercise (any activity that increases your heart rate and causes you to sweat) each week. In addition, most adults need muscle-strengthening exercises on 2 or more days a week.   Maintain a healthy weight. The body mass index (BMI) is a screening tool to identify possible weight problems. It provides an estimate of body fat based on height and weight. Your health care provider can find your BMI and can help you achieve or maintain a healthy weight. For males 20 years and older:  A BMI below 18.5 is considered underweight.  A BMI of 18.5 to 24.9 is normal.  A BMI of 25 to 29.9 is considered overweight.  A BMI of 30 and above is considered obese.  Maintain normal blood lipids and cholesterol by exercising and minimizing your intake of saturated fat. Eat a balanced diet with plenty of fruits and vegetables. Blood tests for lipids and cholesterol should begin at age 26 and be repeated every 5 years. If your lipid or cholesterol levels are high,  you are over age 63, or you are at high risk for heart disease, you may need your cholesterol levels checked more frequently.Ongoing high lipid and cholesterol levels should be treated with medicines if diet and exercise are not working.  If you smoke, find out from your health care provider how to quit. If you do not use tobacco, do not start.  Lung cancer screening is recommended for adults aged 6-80 years who are at high risk for developing lung cancer because of a history of smoking. A yearly low-dose CT scan of the lungs is  recommended for people who have at least a 30-pack-year history of smoking and are current smokers or have quit within the past 15 years. A pack year of smoking is smoking an average of 1 pack of cigarettes a day for 1 year (for example, a 30-pack-year history of smoking could mean smoking 1 pack a day for 30 years or 2 packs a day for 15 years). Yearly screening should continue until the smoker has stopped smoking for at least 15 years. Yearly screening should be stopped for people who develop a health problem that would prevent them from having lung cancer treatment.  If you choose to drink alcohol, do not have more than 2 drinks per day. One drink is considered to be 12 oz (360 mL) of beer, 5 oz (150 mL) of wine, or 1.5 oz (45 mL) of liquor.  Avoid the use of street drugs. Do not share needles with anyone. Ask for help if you need support or instructions about stopping the use of drugs.  High blood pressure causes heart disease and increases the risk of stroke. High blood pressure is more likely to develop in:  People who have blood pressure in the end of the normal range (100-139/85-89 mm Hg).  People who are overweight or obese.  People who are African American.  If you are 98-55 years of age, have your blood pressure checked every 3-5 years. If you are 20 years of age or older, have your blood pressure checked every year. You should have your blood pressure measured twice--once when you are at a hospital or clinic, and once when you are not at a hospital or clinic. Record the average of the two measurements. To check your blood pressure when you are not at a hospital or clinic, you can use:  An automated blood pressure machine at a pharmacy.  A home blood pressure monitor.  If you are 23-3 years old, ask your health care provider if you should take aspirin to prevent heart disease.  Diabetes screening involves taking a blood sample to check your fasting blood sugar level. This should be  done once every 3 years after age 96 if you are at a normal weight and without risk factors for diabetes. Testing should be considered at a younger age or be carried out more frequently if you are overweight and have at least 1 risk factor for diabetes.  Colorectal cancer can be detected and often prevented. Most routine colorectal cancer screening begins at the age of 33 and continues through age 70. However, your health care provider may recommend screening at an earlier age if you have risk factors for colon cancer. On a yearly basis, your health care provider may provide home test kits to check for hidden blood in the stool. A small camera at the end of a tube may be used to directly examine the colon (sigmoidoscopy or colonoscopy) to detect the earliest forms  of colorectal cancer. Talk to your health care provider about this at age 72 when routine screening begins. A direct exam of the colon should be repeated every 5-10 years through age 46, unless early forms of precancerous polyps or small growths are found.  People who are at an increased risk for hepatitis B should be screened for this virus. You are considered at high risk for hepatitis B if:  You were born in a country where hepatitis B occurs often. Talk with your health care provider about which countries are considered high risk.  Your parents were born in a high-risk country and you have not received a shot to protect against hepatitis B (hepatitis B vaccine).  You have HIV or AIDS.  You use needles to inject street drugs.  You live with, or have sex with, someone who has hepatitis B.  You are a man who has sex with other men (MSM).  You get hemodialysis treatment.  You take certain medicines for conditions like cancer, organ transplantation, and autoimmune conditions.  Hepatitis C blood testing is recommended for all people born from 20 through 1965 and any individual with known risk factors for hepatitis C.  Healthy men  should no longer receive prostate-specific antigen (PSA) blood tests as part of routine cancer screening. Talk to your health care provider about prostate cancer screening.  Testicular cancer screening is not recommended for adolescents or adult males who have no symptoms. Screening includes self-exam, a health care provider exam, and other screening tests. Consult with your health care provider about any symptoms you have or any concerns you have about testicular cancer.  Practice safe sex. Use condoms and avoid high-risk sexual practices to reduce the spread of sexually transmitted infections (STIs).  You should be screened for STIs, including gonorrhea and chlamydia if:  You are sexually active and are younger than 24 years.  You are older than 24 years, and your health care provider tells you that you are at risk for this type of infection.  Your sexual activity has changed since you were last screened, and you are at an increased risk for chlamydia or gonorrhea. Ask your health care provider if you are at risk.  If you are at risk of being infected with HIV, it is recommended that you take a prescription medicine daily to prevent HIV infection. This is called pre-exposure prophylaxis (PrEP). You are considered at risk if:  You are a man who has sex with other men (MSM).  You are a heterosexual man who is sexually active with multiple partners.  You take drugs by injection.  You are sexually active with a partner who has HIV.  Talk with your health care provider about whether you are at high risk of being infected with HIV. If you choose to begin PrEP, you should first be tested for HIV. You should then be tested every 3 months for as long as you are taking PrEP.  Use sunscreen. Apply sunscreen liberally and repeatedly throughout the day. You should seek shade when your shadow is shorter than you. Protect yourself by wearing long sleeves, pants, a wide-brimmed hat, and sunglasses year  round whenever you are outdoors.  Tell your health care provider of new moles or changes in moles, especially if there is a change in shape or color. Also, tell your health care provider if a mole is larger than the size of a pencil eraser.  A one-time screening for abdominal aortic aneurysm (AAA) and surgical repair of  large AAAs by ultrasound is recommended for men aged 87-75 years who are current or former smokers.  Stay current with your vaccines (immunizations).   This information is not intended to replace advice given to you by your health care provider. Make sure you discuss any questions you have with your health care provider.   Document Released: 11/04/2007 Document Revised: 05/29/2014 Document Reviewed: 10/03/2010 Elsevier Interactive Patient Education [employer].

## 2015-12-16 NOTE — Addendum Note (Signed)
Encounter addended by: Dwana Melena, RN on: 12/16/2015  3:29 PM<BR>    Actions taken: Visit Navigator Flowsheet section accepted, Sign clinical note, Episode resolved

## 2015-12-16 NOTE — Progress Notes (Signed)
Discharge Summary  Patient Details  Name: Edward Heath MRN: [id number] Date of Birth: 05-Jul-1945 Referring Provider:     Number of Visits: 26  Reason for Discharge:  Early Exit:  Patient dropped out of program after 26 sessions d/t illness. He was not physically able to complete program.   Smoking History:  History  Smoking Status  . Never Smoker  Smokeless Tobacco  . Current User  . Types: Chew    Diagnosis:  No diagnosis found.  ADL UCSD:   Initial Exercise Prescription:     Initial Exercise Prescription - 08/24/15 1600      Date of Initial Exercise RX and Referring Provider   Date 08/24/15     Treadmill   MPH 1.2   Grade 0   Minutes 15   METs 1.92     NuStep   Level 2   Minutes 15   METs 1.5     Prescription Details   Frequency (times per week) 3     Intensity   THRR REST +  30   THRR 40-80% of Max Heartrate 91-130   Ratings of Perceived Exertion 11-13     Progression   Progression Continue to progress workloads to maintain intensity without signs/symptoms of physical distress.     Resistance Training   Training Prescription Yes   Weight 1   Reps 10-12      Discharge Exercise Prescription (Final Exercise Prescription Changes):     Exercise Prescription Changes - 11/03/15 1300      Exercise Review   Progression Yes     Response to Exercise   Blood Pressure (Admit) 108/70   Blood Pressure (Exercise) 122/60   Blood Pressure (Exit) 110/56   Heart Rate (Admit) 60 bpm   Heart Rate (Exercise) 84 bpm   Heart Rate (Exit) 69 bpm   Rating of Perceived Exertion (Exercise) 11   Symptoms No   Duration Progress to 30 minutes of continuous aerobic without signs/symptoms of physical distress   Intensity Rest + 30     Progression   Progression Continue to progress workloads to maintain intensity without signs/symptoms of physical distress.     Resistance Training   Training Prescription Yes   Weight 3.0   Reps 10-12     Interval Training    Interval Training No     Treadmill   MPH 2.7   Grade 0   Minutes 15   METs 2.9     NuStep   Level 3   Minutes 15   METs 3.72     Home Exercise Plan   Plans to continue exercise at Home   Frequency Add 2 additional days to program exercise sessions.      Functional Capacity:     6 Minute Walk    Row Name 08/24/15 1553         6 Minute Walk   Phase Initial     Distance 1150 feet     Walk Time 6 minutes     # of Rest Breaks 0     MPH 2.18     METS 2.67     RPE 11     Perceived Dyspnea  9     VO2 Peak 6.78     Symptoms No     Resting HR 52 bpm     Resting BP 100/52     Max Ex. HR 75 bpm     Max Ex. BP 122/64     2 Minute  Post BP 98/58        Psychological, QOL, Others - Outcomes: PHQ 2/9: Depression screen [facility] 2/9 12/14/2015 08/24/2015 05/25/2015 08/05/2014 10/16/2013  Decreased Interest 0 0 0 0 0  Down, Depressed, Hopeless 1 0 0 1 0  PHQ - 2 Score 1 0 0 1 0  Altered sleeping - 0 - - -  Tired, decreased energy - 0 - - -  Change in appetite - 0 - - -  Feeling bad or failure about yourself  - 0 - - -  Trouble concentrating - 0 - - -  Moving slowly or fidgety/restless - 0 - - -  Suicidal thoughts - 0 - - -  PHQ-9 Score - 0 - - -  Difficult doing work/chores - Not difficult at all - - -    Quality of Life:     Quality of Life - 08/25/15 1405      Quality of Life Scores   Health/Function Pre 24.96 %   Socioeconomic Pre 27 %   Psych/Spiritual Pre 27.21 %   Family Pre 27 %   GLOBAL Pre 26.11 %      Personal Goals: Goals established at orientation with interventions provided to work toward goal.     Personal Goals and Risk Factors at Admission - 08/24/15 1719      Core Components/Risk Factors/Patient Goals on Admission    Weight Management Yes   Admit Weight 242 lb 8 oz (110 kg)   Goal Weight: Short Term 237 lb 8 oz (107.7 kg)   Goal Weight: Long Term 235 lb (106.6 kg)   Expected Outcomes Short Term: Continue to assess and modify interventions  until short term weight is achieved   Sedentary --  No, very activie   Increase Strength and Stamina Yes   Intervention Provide advice, education, support and counseling about physical activity/exercise needs.;Develop an individualized exercise prescription for aerobic and resistive training based on initial evaluation findings, risk stratification, comorbidities and participant's personal goals.   Tobacco Cessation --  None needed   Diabetes --  Patient states he already has dieabetes under control.    Personal Goal Other Yes   Personal Goal More strength   Intervention More stamina   Expected Outcomes Lose 5-7lbs, Be able to continue to play golf       Personal Goals Discharge:     Goals and Risk Factor Review    Row Name 09/17/15 0849 10/08/15 1635           Core Components/Risk Factors/Patient Goals Review   Personal Goals Review Weight Management/Obesity;Increase Strength and Stamina Weight Management/Obesity;Increase Strength and Stamina      Review patient has not showed any changes at this time. Will continue to education and progress patient as needed.  Will continue to educate and progress patient as needed. patient has gained approx 4 lbs since started program.  Patient also educated on diet.  Patient states he does feel a little stronger.       Expected Outcomes wight loss and increased energy wight loss and increased energy         Nutrition & Weight - Outcomes:     Pre Biometrics - 08/25/15 1403      Pre Biometrics   Height 5\' 10"  (1.778 m)   Weight 242 lb 8 oz (110 kg)   Waist Circumference 45 inches   Hip Circumference 50 inches   Waist to Hip Ratio 0.9 %   BMI (Calculated) 34.9   Triceps  Skinfold 15 mm   % Body Fat 32.3 %   Grip Strength 95 kg   Flexibility 0 in   Single Leg Stand 13 seconds       Nutrition:     Nutrition Therapy & Goals - 08/24/15 1717      Nutrition Therapy   Diet --  Regular diet     Intervention Plan   Intervention  Prescribe, educate and counsel regarding individualized specific dietary modifications aiming towards targeted core components such as weight, hypertension, lipid management, diabetes, heart failure and other comorbidities.   Expected Outcomes Short Term Goal: Understand basic principles of dietary content, such as calories, fat, sodium, cholesterol and nutrients.;Long Term Goal: Adherence to prescribed nutrition plan.      Nutrition Discharge:     Nutrition Assessments - 08/24/15 1718      MEDFICTS Scores   Pre Score 25      Education Questionnaire Score:     Knowledge Questionnaire Score - 08/24/15 1716      Knowledge Questionnaire Score   Pre Score 22/24      Goals reviewed with patient; copy given to patient.

## 2016-02-03 ENCOUNTER — Other Ambulatory Visit: Payer: Medicare Other

## 2016-02-03 ENCOUNTER — Ambulatory Visit (INDEPENDENT_AMBULATORY_CARE_PROVIDER_SITE_OTHER): Payer: Medicare Other

## 2016-02-03 DIAGNOSIS — R29890 Loss of height: Secondary | ICD-10-CM

## 2016-03-24 ENCOUNTER — Ambulatory Visit (INDEPENDENT_AMBULATORY_CARE_PROVIDER_SITE_OTHER): Payer: Medicare Other

## 2016-03-24 ENCOUNTER — Ambulatory Visit (INDEPENDENT_AMBULATORY_CARE_PROVIDER_SITE_OTHER): Payer: Medicare Other | Admitting: Family Medicine

## 2016-03-24 ENCOUNTER — Encounter: Payer: Self-pay | Admitting: Family Medicine

## 2016-03-24 VITALS — BP 117/68 | HR 61 | Temp 98.5°F | Ht 68.0 in | Wt 256.6 lb

## 2016-03-24 DIAGNOSIS — M79644 Pain in right finger(s): Secondary | ICD-10-CM

## 2016-03-24 NOTE — Progress Notes (Signed)
Subjective:  Patient ID: Edward Heath, male    DOB: [birth year]  Age: 70 y.o. MRN: [id number]  CC: Edema (right middle finger started monday, took Eliquis last couple days, yesterday worse again, unable to bend, throbbing pain)   HPI Edward Heath presents for 5 days of moderate pain and difficulty with bending the right third finger. It pops at the PIP joint. It also throbs. He started taking Eliquis couple of days ago. However, the pain started a couple of days before this but is worse since starting the medication. He denies any bruising. No bleeding.   History Edward Heath has a past medical history of CHF (congestive heart failure) ([place]); Diabetes mellitus without complication ([place]); GERD (gastroesophageal reflux disease); Heart attack (2004, 2015); Hyperlipidemia; Hypertension; Lumbar vertebral fracture ([place]) (2012); Skin cancer (06/2014); Stroke [facility]); Thyroid disease; and TIA (transient ischemic attack) (2009).   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   His family history includes Aneurysm in his father; Heart attack in his mother; Heart attack (age of onset: 74) in his brother.He reports that he has never smoked. His smokeless tobacco use includes Chew. He reports that he drinks alcohol. He reports that he does not use drugs.    ROS Review of Systems  Constitutional: Negative for chills, diaphoresis and fever.  HENT: Negative for rhinorrhea and sore throat.   Respiratory: Negative for cough and shortness of breath.   Cardiovascular: Negative for chest pain.  Gastrointestinal: Negative for abdominal pain.  Musculoskeletal: Negative for myalgias.  Skin: Negative for rash.  Neurological: Negative for weakness and headaches.    Objective:  BP 117/68   Pulse 61   Temp 98.5 F (36.9 C) (Oral)   Ht 5\' 8"  (1.727 m)   Wt 256 lb 9.6 oz (116.4 kg)   BMI  39.02 kg/m   BP Readings from Last 3 Encounters:  03/24/16 117/68  12/14/15 102/60  08/24/15 (!) 100/52    Wt Readings from Last 3 Encounters:  03/24/16 256 lb 9.6 oz (116.4 kg)  12/14/15 251 lb (113.9 kg)  08/25/15 242 lb 8 oz (110 kg)     Physical Exam  Constitutional: He is oriented to person, place, and time. He appears well-developed and well-nourished.  HENT:  Head: Normocephalic and atraumatic.  Right Ear: Tympanic membrane and external ear normal. No decreased hearing is noted.  Left Ear: Tympanic membrane and external ear normal. No decreased hearing is noted.  Mouth/Throat: No oropharyngeal exudate or posterior oropharyngeal erythema.  Eyes: Pupils are equal, round, and reactive to light.  Neck: Normal range of motion. Neck supple.  Cardiovascular: Normal rate and regular rhythm.   No murmur heard. Pulmonary/Chest: Breath sounds normal. No respiratory distress.  Abdominal: Soft.  Musculoskeletal: He exhibits tenderness (at the third PIP. There is mild edema. Moderate tenderness.).  Neurological: He is alert and oriented to person, place, and time.  Vitals reviewed.    Lab Results  Component Value Date   ALT 23 08/16/2015   AST 30 08/16/2015   NA 138 08/16/2015   K 4.4 08/16/2015   CREATININE 0.9 08/16/2015   BUN 11 08/16/2015   HGBA1C 7.2 08/16/2015    No results found.  Assessment & Plan:   [name] was seen today for edema.  Diagnoses and all orders for this visit:  Pain of right middle finger -     DG Finger Middle Right; Future -  Uric acid   Patient reassured. Apply heat. Avoid NSAIDs due to use of the Eliquis. Follow-up should pain take a turn for the worse.   Follow-up: Return if symptoms worsen or fail to improve and as previously recommended., for Anticoagulation.  Claretta Fraise, M.D.

## 2016-03-25 LAB — URIC ACID: Uric Acid: 4.9 mg/dL (ref 3.7–8.6)

## 2016-03-29 DIAGNOSIS — Z8673 Personal history of transient ischemic attack (TIA), and cerebral infarction without residual deficits: Secondary | ICD-10-CM | POA: Diagnosis not present

## 2016-03-29 DIAGNOSIS — I781 Nevus, non-neoplastic: Secondary | ICD-10-CM | POA: Diagnosis not present

## 2016-03-29 DIAGNOSIS — L814 Other melanin hyperpigmentation: Secondary | ICD-10-CM | POA: Diagnosis not present

## 2016-03-29 DIAGNOSIS — L821 Other seborrheic keratosis: Secondary | ICD-10-CM | POA: Diagnosis not present

## 2016-03-29 DIAGNOSIS — C4492 Squamous cell carcinoma of skin, unspecified: Secondary | ICD-10-CM | POA: Diagnosis not present

## 2016-03-29 DIAGNOSIS — I2510 Atherosclerotic heart disease of native coronary artery without angina pectoris: Secondary | ICD-10-CM | POA: Diagnosis not present

## 2016-03-29 DIAGNOSIS — Z85828 Personal history of other malignant neoplasm of skin: Secondary | ICD-10-CM | POA: Diagnosis not present

## 2016-03-29 DIAGNOSIS — Z95818 Presence of other cardiac implants and grafts: Secondary | ICD-10-CM | POA: Diagnosis not present

## 2016-03-29 DIAGNOSIS — I509 Heart failure, unspecified: Secondary | ICD-10-CM | POA: Diagnosis not present

## 2016-03-29 DIAGNOSIS — E785 Hyperlipidemia, unspecified: Secondary | ICD-10-CM | POA: Diagnosis not present

## 2016-03-29 DIAGNOSIS — I110 Hypertensive heart disease with heart failure: Secondary | ICD-10-CM | POA: Diagnosis not present

## 2016-03-29 DIAGNOSIS — E119 Type 2 diabetes mellitus without complications: Secondary | ICD-10-CM | POA: Diagnosis not present

## 2016-03-29 DIAGNOSIS — L570 Actinic keratosis: Secondary | ICD-10-CM | POA: Diagnosis not present

## 2016-05-17 ENCOUNTER — Ambulatory Visit (INDEPENDENT_AMBULATORY_CARE_PROVIDER_SITE_OTHER): Payer: Medicare Other | Admitting: Family Medicine

## 2016-05-17 ENCOUNTER — Encounter: Payer: Self-pay | Admitting: Family Medicine

## 2016-05-17 VITALS — BP 110/65 | HR 61 | Temp 97.2°F | Ht 68.0 in | Wt 261.2 lb

## 2016-05-17 DIAGNOSIS — J0100 Acute maxillary sinusitis, unspecified: Secondary | ICD-10-CM

## 2016-05-17 MED ORDER — ONDANSETRON 4 MG PO TBDP: 4.0000 mg | ORAL_TABLET | Freq: Three times a day (TID) | ORAL | 0 refills | Status: DC | PRN

## 2016-05-17 MED ORDER — CEFDINIR 300 MG PO CAPS: 300.0000 mg | ORAL_CAPSULE | Freq: Two times a day (BID) | ORAL | 0 refills | Status: DC

## 2016-05-17 NOTE — Progress Notes (Signed)
BP 110/65   Pulse 61   Temp 97.2 F (36.2 C) (Oral)   Ht 5\' 8"  (1.727 m)   Wt 261 lb 3.2 oz (118.5 kg)   BMI 39.72 kg/m    Subjective:    Patient ID: Edward Heath, male    DOB: 1946-03-09, 70 y.o.   MRN: [id number]  HPI: Edward Heath is a 70 y.o. male presenting on 05/17/2016 for Cough (blood in it this afternoon. x 3 days); Nasal Congestion; and Facial Pain   HPI Congestion and cough and facial pain Patient comes in today because his been having congestion and cough and facial pain is been going on for the past 3 days. He denies any fevers or chills or shortness of breath or wheezing. He also had some recent teeth pulled on his upper jaw on the front and that may be where some of the pain is rating from but is also been having a lot of sinus congestion and drainage and postnasal drainage that he can't clear up. He's also been having some brown thicker mucus-like he's been having dried blood.  Relevant past medical, surgical, family and social history reviewed and updated as indicated. Interim medical history since our last visit reviewed. Allergies and medications reviewed and updated.  Review of Systems  Constitutional: Negative for chills and fever.  HENT: Positive for congestion, postnasal drip, rhinorrhea, sinus pressure and sore throat. Negative for ear discharge, ear pain, sneezing and voice change.   Eyes: Negative for pain, discharge, redness and visual disturbance.  Respiratory: Positive for cough. Negative for shortness of breath and wheezing.   Cardiovascular: Negative for chest pain and leg swelling.  Musculoskeletal: Negative for gait problem.  Skin: Negative for rash.  All other systems reviewed and are negative.   Per HPI unless specifically indicated above      Objective:    BP 110/65   Pulse 61   Temp 97.2 F (36.2 C) (Oral)   Ht 5\' 8"  (1.727 m)   Wt 261 lb 3.2 oz (118.5 kg)   BMI 39.72 kg/m   Wt Readings from Last 3 Encounters:  05/17/16  261 lb 3.2 oz (118.5 kg)  03/24/16 256 lb 9.6 oz (116.4 kg)  12/14/15 251 lb (113.9 kg)    Physical Exam  Constitutional: He is oriented to person, place, and time. He appears well-developed and well-nourished. No distress.  HENT:  Right Ear: Tympanic membrane, external ear and ear canal normal.  Left Ear: Tympanic membrane, external ear and ear canal normal.  Nose: Mucosal edema and rhinorrhea present. No sinus tenderness. No epistaxis. Right sinus exhibits maxillary sinus tenderness. Right sinus exhibits no frontal sinus tenderness. Left sinus exhibits maxillary sinus tenderness. Left sinus exhibits no frontal sinus tenderness.  Mouth/Throat: Uvula is midline and mucous membranes are normal. Posterior oropharyngeal edema and posterior oropharyngeal erythema present. No oropharyngeal exudate or tonsillar abscesses.  Eyes: Conjunctivae are normal. Right eye exhibits no discharge. Left eye exhibits no discharge. No scleral icterus.  Neck: Neck supple. No thyromegaly present.  Cardiovascular: Normal rate, regular rhythm, normal heart sounds and intact distal pulses.   No murmur heard. Pulmonary/Chest: Effort normal and breath sounds normal. No respiratory distress. He has no wheezes. He has no rales.  Musculoskeletal: Normal range of motion. He exhibits no edema.  Lymphadenopathy:    He has no cervical adenopathy.  Neurological: He is alert and oriented to person, place, and time. Coordination normal.  Skin: Skin is warm and dry. No rash  noted. He is not diaphoretic.  Psychiatric: He has a normal mood and affect. His behavior is normal.  Nursing note and vitals reviewed.     Assessment & Plan:   Problem List Items Addressed This Visit    None    Visit Diagnoses    Acute non-recurrent maxillary sinusitis    -  Primary   Relevant Medications   cefdinir (OMNICEF) 300 MG capsule       Follow up plan: Return if symptoms worsen or fail to improve.  Counseling provided for all of the  vaccine components No orders of the defined types were placed in this encounter.   Caryl Pina, MD [facility] 05/17/2016, 5:09 PM

## 2016-06-26 DIAGNOSIS — Z8673 Personal history of transient ischemic attack (TIA), and cerebral infarction without residual deficits: Secondary | ICD-10-CM | POA: Diagnosis not present

## 2016-06-26 DIAGNOSIS — I482 Chronic atrial fibrillation: Secondary | ICD-10-CM | POA: Diagnosis not present

## 2016-06-26 DIAGNOSIS — I480 Paroxysmal atrial fibrillation: Secondary | ICD-10-CM | POA: Insufficient documentation

## 2016-07-05 ENCOUNTER — Ambulatory Visit (INDEPENDENT_AMBULATORY_CARE_PROVIDER_SITE_OTHER): Payer: Medicare Other | Admitting: Family Medicine

## 2016-07-05 ENCOUNTER — Encounter: Payer: Self-pay | Admitting: Family Medicine

## 2016-07-05 ENCOUNTER — Ambulatory Visit (INDEPENDENT_AMBULATORY_CARE_PROVIDER_SITE_OTHER): Payer: Medicare Other

## 2016-07-05 VITALS — BP 104/59 | HR 64 | Temp 98.2°F | Ht 68.0 in | Wt 263.0 lb

## 2016-07-05 DIAGNOSIS — S60221A Contusion of right hand, initial encounter: Secondary | ICD-10-CM

## 2016-07-05 DIAGNOSIS — S20211A Contusion of right front wall of thorax, initial encounter: Secondary | ICD-10-CM

## 2016-07-05 MED ORDER — ACETAMINOPHEN-CODEINE #3 300-30 MG PO TABS: 1.0000 | ORAL_TABLET | ORAL | 0 refills | Status: DC | PRN

## 2016-07-05 MED ORDER — AMOXICILLIN 875 MG PO TABS: 875.0000 mg | ORAL_TABLET | Freq: Two times a day (BID) | ORAL | 0 refills | Status: AC

## 2016-07-05 NOTE — Progress Notes (Signed)
Subjective:  Patient ID: Edward Heath, male    DOB: 22-Jun-1945  Age: 71 y.o. MRN: [id number]  CC: Fall (pt here today c/o right side pain, right hand pain and swelling and some discomfort of the toes on his right foot. Pt tripped last night and fell going into his house and landed on his right side.)   HPI Edward Heath presents for pain at right flank. Hurts to take a deep breath. No hemoptysis. No hematemesis. Caught himself with right hand when he fell. Pain now at wrist with edema noted.   History Kaion has a past medical history of CHF (congestive heart failure) ([place]); Diabetes mellitus without complication ([place]); GERD (gastroesophageal reflux disease); Heart attack (2004, 2015); Hyperlipidemia; Hypertension; Lumbar vertebral fracture ([place]) (2012); Skin cancer (06/2014); Stroke [facility]); Thyroid disease; and TIA (transient ischemic attack) (2009).   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   His family history includes Aneurysm in his father; Heart attack in his mother; Heart attack (age of onset: 58) in his brother.He reports that he has never smoked. His smokeless tobacco use includes Chew. He reports that he drinks alcohol. He reports that he does not use drugs.    ROS Review of Systems  Constitutional: Negative for chills, diaphoresis and fever.  HENT: Negative for rhinorrhea and sore throat.   Respiratory: Positive for shortness of breath (hurts to take a deep breath.). Negative for cough.   Cardiovascular: Negative for chest pain.  Gastrointestinal: Negative for abdominal pain.  Musculoskeletal: Positive for arthralgias and myalgias.  Skin: Negative for rash.  Neurological: Negative for weakness and headaches.    Objective:  BP (!) 104/59   Pulse 64   Temp 98.2 F (36.8 C) (Oral)   Ht 5\' 8"  (1.727 m)   Wt 263 lb (119.3 kg)   BMI 39.99  kg/m   BP Readings from Last 3 Encounters:  07/05/16 (!) 104/59  05/17/16 110/65  03/24/16 117/68    Wt Readings from Last 3 Encounters:  07/05/16 263 lb (119.3 kg)  05/17/16 261 lb 3.2 oz (118.5 kg)  03/24/16 256 lb 9.6 oz (116.4 kg)     Physical Exam  Constitutional: He is oriented to person, place, and time. He appears well-developed and well-nourished.  HENT:  Head: Normocephalic and atraumatic.  Right Ear: Tympanic membrane and external ear normal. No decreased hearing is noted.  Left Ear: Tympanic membrane and external ear normal. No decreased hearing is noted.  Mouth/Throat: No oropharyngeal exudate or posterior oropharyngeal erythema.  Eyes: Pupils are equal, round, and reactive to light.  Neck: Normal range of motion. Neck supple.  Cardiovascular: Normal rate and regular rhythm.   No murmur heard. Pulmonary/Chest: Breath sounds normal. No respiratory distress.  Abdominal: Soft. Bowel sounds are normal. He exhibits no mass. There is no tenderness.  Musculoskeletal: Normal range of motion. He exhibits tenderness (at right rib 10 region near midaxillary line).  Neurological: He is alert and oriented to person, place, and time.  Vitals reviewed.   No results found.  Assessment & Plan:   Edward Heath was seen today for fall.  Diagnoses and all orders for this visit:  Contusion of right chest wall, initial encounter -     DG Ribs Unilateral W/Chest Right; Future  Contusion of right hand, initial encounter -     DG Hand Complete Right; Future  Other orders -  amoxicillin (AMOXIL) 875 MG tablet; Take 1 tablet (875 mg total) by mouth 2 (two) times daily. -     acetaminophen-codeine (TYLENOL #3) 300-30 MG tablet; Take 1-2 tablets by mouth every 4 (four) hours as needed for moderate pain.    XR - no fx at hand or rib. Lungs expanded  I have discontinued Mr. Ades's cefdinir. I am also having him start on amoxicillin and acetaminophen-codeine. Additionally, I am  having him maintain his omeprazole, docusate, atorvastatin, aspirin EC, glipiZIDE, Magnesium, metFORMIN, nitroGLYCERIN, Omega-3, Potassium, vitamin E, gabapentin, Cholecalciferol, apixaban, amiodarone, metoprolol succinate, ferrous sulfate, traMADol, cyanocobalamin, furosemide, Salicylic Acid, acetaminophen, meloxicam, spironolactone, and lisinopril.  Allergies as of 07/05/2016   No Known Allergies     Medication List       Accurate as of 07/05/16 11:59 PM. Always use your most recent med list.          acetaminophen 650 MG CR tablet Commonly known as:  TYLENOL Take 650 mg by mouth.   acetaminophen-codeine 300-30 MG tablet Commonly known as:  TYLENOL #3 Take 1-2 tablets by mouth every 4 (four) hours as needed for moderate pain.   amiodarone 200 MG tablet Commonly known as:  PACERONE Take 1 tablet (200 mg total) by mouth daily.   amoxicillin 875 MG tablet Commonly known as:  AMOXIL Take 1 tablet (875 mg total) by mouth 2 (two) times daily.   apixaban 5 MG Tabs tablet Commonly known as:  ELIQUIS Take 1 tablet (5 mg total) by mouth 2 (two) times daily. 2 BID X 7 days then 1 BID   aspirin EC 81 MG tablet Take 81 mg by mouth.   atorvastatin 40 MG tablet Commonly known as:  LIPITOR Take 40 mg by mouth daily.   Cholecalciferol 1000 units tablet Commonly known as:  VITAMIN D-1000 MAX ST Take 1 tablet (1,000 Units total) by mouth 2 (two) times daily.   cyanocobalamin 1000 MCG tablet Take 1,000 mcg by mouth daily.   docusate 60 MG/15ML syrup Commonly known as:  COLACE Take 60 mg by mouth daily.   E-400 400 UNIT capsule Generic drug:  vitamin E Take 400 Units by mouth.   ferrous sulfate 325 (65 FE) MG tablet Take 325 mg by mouth.   furosemide 20 MG tablet Commonly known as:  LASIX TAKE (2) TABLETS DAILY   gabapentin 300 MG capsule Commonly known as:  NEURONTIN Take 300 mg by mouth 2 (two) times daily.   glipiZIDE 5 MG tablet Commonly known as:  GLUCOTROL Take 5  mg by mouth daily before breakfast.   lisinopril 5 MG tablet Commonly known as:  PRINIVIL,ZESTRIL Take 10 mg by mouth.   Magnesium 250 MG Tabs Take 500 mg by mouth 2 (two) times daily.   meloxicam 7.5 MG tablet Commonly known as:  MOBIC Take 7.5 mg by mouth.   metFORMIN 1000 MG tablet Commonly known as:  GLUCOPHAGE Take 1,000 mg by mouth 2 (two) times daily with a meal.   metoprolol succinate 100 MG 24 hr tablet Commonly known as:  TOPROL-XL Take 1 tablet (100 mg total) by mouth daily. Take with or immediately following a meal.   nitroGLYCERIN 0.4 MG SL tablet Commonly known as:  NITROSTAT Place 0.4 mg under the tongue.   Omega-3 1000 MG Caps Take 2 g by mouth 2 (two) times daily.   omeprazole 10 MG capsule Commonly known as:  PRILOSEC Take 20 mg by mouth 2 (two) times daily.   Potassium 95 MG Tbcr Take  1 tablet by mouth 2 (two) times daily.   Salicylic Acid 2 % Crea Apply 1 application topically 2 (two) times daily as needed.   spironolactone 25 MG tablet Commonly known as:  ALDACTONE Take 25 mg by mouth.   traMADol 50 MG tablet Commonly known as:  ULTRAM Take 50 mg by mouth 2 (two) times daily.        Follow-up: Return in about 2 weeks (around 07/19/2016), or if symptoms worsen or fail to improve.  Claretta Fraise, M.D.

## 2016-08-07 ENCOUNTER — Ambulatory Visit (INDEPENDENT_AMBULATORY_CARE_PROVIDER_SITE_OTHER): Payer: Medicare Other | Admitting: Family Medicine

## 2016-08-07 ENCOUNTER — Ambulatory Visit (INDEPENDENT_AMBULATORY_CARE_PROVIDER_SITE_OTHER): Payer: Medicare Other

## 2016-08-07 ENCOUNTER — Encounter: Payer: Self-pay | Admitting: Family Medicine

## 2016-08-07 VITALS — BP 91/60 | HR 62 | Temp 97.3°F | Ht 68.0 in | Wt 257.0 lb

## 2016-08-07 DIAGNOSIS — J40 Bronchitis, not specified as acute or chronic: Secondary | ICD-10-CM

## 2016-08-07 DIAGNOSIS — J329 Chronic sinusitis, unspecified: Secondary | ICD-10-CM | POA: Diagnosis not present

## 2016-08-07 DIAGNOSIS — J0100 Acute maxillary sinusitis, unspecified: Secondary | ICD-10-CM

## 2016-08-07 MED ORDER — CEFDINIR 300 MG PO CAPS: 300.0000 mg | ORAL_CAPSULE | Freq: Two times a day (BID) | ORAL | 0 refills | Status: DC

## 2016-08-07 MED ORDER — HYDROCODONE-HOMATROPINE 5-1.5 MG/5ML PO SYRP: 5.0000 mL | ORAL_SOLUTION | Freq: Four times a day (QID) | ORAL | 0 refills | Status: DC | PRN

## 2016-08-07 NOTE — Addendum Note (Signed)
Addended by: Marylin Crosby on: 08/07/2016 11:14 AM   Modules accepted: Orders

## 2016-08-07 NOTE — Progress Notes (Signed)
Subjective:  Patient ID: Edward Heath, male    DOB: [birth year]  Age: 71 y.o. MRN: [id number]  CC: Cough (pt here today c/o sinus drainage, "head is in a drum", and coughing.)   HPI Edward Heath presents for Patient presents with upper respiratory congestion. Rhinorrhea that is frequently purulent. There is moderate sore throat. Patient reports coughing frequently as well. Copious white sputum noted. There is no fever, chills or sweats. The patient reports being short of breath. Onset was 3 days ago. Gradually worsening. Tried OTC corcidin without improvement.   History Edward Heath has a past medical history of CHF (congestive heart failure) ([place]); Diabetes mellitus without complication ([place]); GERD (gastroesophageal reflux disease); Heart attack (2004, 2015); Hyperlipidemia; Hypertension; Lumbar vertebral fracture ([place]) (2012); Skin cancer (06/2014); Stroke [facility]); Thyroid disease; and TIA (transient ischemic attack) (2009).   Edward Heath has a past surgical history that includes Cardiac defibrillator placement (12/2003, Repaired in 2012); Skin cancer excision (06/2014); and Knee surgery (Right, 1994).   His family history includes Aneurysm in his father; Heart attack in his mother; Heart attack (age of onset: 41) in his brother.Edward Heath reports that Edward Heath has never smoked. His smokeless tobacco use includes Chew. Edward Heath reports that Edward Heath drinks alcohol. Edward Heath reports that Edward Heath does not use drugs.  Current Outpatient Prescriptions on File Prior to Visit  Medication Sig Dispense Refill  . acetaminophen (TYLENOL) 650 MG CR tablet Take 650 mg by mouth.    Marland Kitchen amiodarone (PACERONE) 200 MG tablet Take 1 tablet (200 mg total) by mouth daily. 30 tablet 0  . apixaban (ELIQUIS) 5 MG TABS tablet Take 1 tablet (5 mg total) by mouth 2 (two) times daily. 2 BID X 7 days then 1 BID 75 tablet 0  . aspirin EC 81 MG tablet Take 81 mg by mouth.    Marland Kitchen atorvastatin (LIPITOR) 40 MG tablet Take 40 mg by mouth daily.      . Cholecalciferol (VITAMIN  D-1000 MAX ST) 1000 units tablet Take 1 tablet (1,000 Units total) by mouth 2 (two) times daily. 60 tablet 11  . cyanocobalamin 1000 MCG tablet Take 1,000 mcg by mouth daily.    Marland Kitchen docusate (COLACE) 60 MG/15ML syrup Take 60 mg by mouth daily.      . ferrous sulfate 325 (65 FE) MG tablet Take 325 mg by mouth.    . furosemide (LASIX) 20 MG tablet TAKE (2) TABLETS DAILY (Patient taking differently: take 1 (20mg ) tablet daily) 60 tablet 2  . gabapentin (NEURONTIN) 300 MG capsule Take 300 mg by mouth 2 (two) times daily.    Marland Kitchen glipiZIDE (GLUCOTROL) 5 MG tablet Take 5 mg by mouth daily before breakfast.     . lisinopril (PRINIVIL,ZESTRIL) 5 MG tablet Take 10 mg by mouth.    . Magnesium 250 MG TABS Take 500 mg by mouth 2 (two) times daily.     . meloxicam (MOBIC) 7.5 MG tablet Take 7.5 mg by mouth.    . metFORMIN (GLUCOPHAGE) 1000 MG tablet Take 1,000 mg by mouth 2 (two) times daily with a meal.     . metoprolol succinate (TOPROL-XL) 100 MG 24 hr tablet Take 1 tablet (100 mg total) by mouth daily. Take with or immediately following a meal. 90 tablet 0  . Omega-3 1000 MG CAPS Take 2 g by mouth 2 (two) times daily.     Marland Kitchen omeprazole (PRILOSEC) 10 MG capsule Take 20 mg by mouth 2 (two) times daily.     . Potassium 95 MG  TBCR Take 1 tablet by mouth 2 (two) times daily.     . Salicylic Acid 2 % CREA Apply 1 application topically 2 (two) times daily as needed.    Marland Kitchen spironolactone (ALDACTONE) 25 MG tablet Take 25 mg by mouth.    . traMADol (ULTRAM) 50 MG tablet Take 50 mg by mouth 2 (two) times daily.    . vitamin E (E-400) 400 UNIT capsule Take 400 Units by mouth.    . nitroGLYCERIN (NITROSTAT) 0.4 MG SL tablet Place 0.4 mg under the tongue.     No current facility-administered medications on file prior to visit.     ROS Review of Systems  Constitutional: Negative for activity change, appetite change, chills and fever.  HENT: Positive for congestion, postnasal drip, rhinorrhea and sinus pressure. Negative  for ear discharge, ear pain, hearing loss, nosebleeds, sneezing and trouble swallowing.   Respiratory: Positive for cough (profuse, worse with laying down), chest tightness and shortness of breath.   Cardiovascular: Negative for chest pain and palpitations.  Skin: Negative for rash.    Objective:  BP 91/60   Pulse 62   Temp 97.3 F (36.3 C) (Oral)   Ht 5\' 8"  (1.727 m)   Wt 257 lb (116.6 kg)   BMI 39.08 kg/m   Physical Exam  Constitutional: Edward Heath appears well-developed and well-nourished.  HENT:  Head: Normocephalic and atraumatic.  Right Ear: Tympanic membrane and external ear normal. No decreased hearing is noted.  Left Ear: Tympanic membrane and external ear normal. No decreased hearing is noted.  Nose: Mucosal edema present. Right sinus exhibits no frontal sinus tenderness. Left sinus exhibits no frontal sinus tenderness.  Mouth/Throat: No oropharyngeal exudate or posterior oropharyngeal erythema.  Neck: No Brudzinski's sign noted.  Pulmonary/Chest: Effort normal. No respiratory distress. Edward Heath has wheezes.  Lymphadenopathy:       Head (right side): No preauricular adenopathy present.       Head (left side): No preauricular adenopathy present.       Right cervical: No superficial cervical adenopathy present.      Left cervical: No superficial cervical adenopathy present.    Assessment & Plan:   Edward Heath was seen today for cough.  Diagnoses and all orders for this visit:  Sinobronchitis -     DG Chest 2 View; Future   I have discontinued Edward Heath's acetaminophen-codeine. I am also having him maintain his omeprazole, docusate, atorvastatin, aspirin EC, glipiZIDE, Magnesium, metFORMIN, nitroGLYCERIN, Omega-3, Potassium, vitamin E, gabapentin, Cholecalciferol, apixaban, amiodarone, metoprolol succinate, ferrous sulfate, traMADol, cyanocobalamin, furosemide, Salicylic Acid, acetaminophen, meloxicam, spironolactone, lisinopril, and levothyroxine.  Meds ordered this encounter    Medications  . levothyroxine (SYNTHROID, LEVOTHROID) 125 MCG tablet    Sig: Take 125 mcg by mouth daily before breakfast.     Follow-up: No Follow-up on file.  Claretta Fraise, M.D.

## 2016-08-15 LAB — HEMOGLOBIN A1C: Hemoglobin A1C: 8.1

## 2016-10-13 ENCOUNTER — Encounter: Payer: Self-pay | Admitting: Nurse Practitioner

## 2016-10-13 ENCOUNTER — Ambulatory Visit (INDEPENDENT_AMBULATORY_CARE_PROVIDER_SITE_OTHER): Payer: Medicare Other | Admitting: Nurse Practitioner

## 2016-10-13 VITALS — BP 101/65 | HR 82 | Temp 97.3°F | Ht 68.0 in | Wt 257.2 lb

## 2016-10-13 DIAGNOSIS — B029 Zoster without complications: Secondary | ICD-10-CM | POA: Diagnosis not present

## 2016-10-13 NOTE — Patient Instructions (Signed)
Shingles Shingles is an infection that causes a painful skin rash and fluid-filled blisters. Shingles is caused by the same virus that causes chickenpox. Shingles only develops in people who:  Have had chickenpox.  Have gotten the chickenpox vaccine. (This is rare.) The first symptoms of shingles may be itching, tingling, or pain in an area on your skin. A rash will follow in a few days or weeks. The rash is usually on one side of the body in a bandlike or beltlike pattern. Over time, the rash turns into fluid-filled blisters that break open, scab over, and dry up. Medicines may:  Help you manage pain.  Help you recover more quickly.  Help to prevent long-term problems. Follow these instructions at home: Medicines  Take medicines only as told by your doctor.  Apply an anti-itch or numbing cream to the affected area as told by your doctor. Blister and Rash Care  Take a cool bath or put cool compresses on the area of the rash or blisters as told by your doctor. This may help with pain and itching.  Keep your rash covered with a loose bandage (dressing). Wear loose-fitting clothing.  Keep your rash and blisters clean with mild soap and cool water or as told by your doctor.  Check your rash every day for signs of infection. These include redness, swelling, and pain that lasts or gets worse.  Do not pick your blisters.  Do not scratch your rash. General instructions  Rest as told by your doctor.  Keep all follow-up visits as told by your doctor. This is important.  Until your blisters scab over, your infection can cause chickenpox in people who have never had it or been vaccinated against it. To prevent this from happening, avoid touching other people or being around other people, especially:  Babies.  Pregnant women.  Children who have eczema.  Elderly people who have transplants.  People who have chronic illnesses, such as leukemia or AIDS. Contact a doctor if:  Your  pain does not get better with medicine.  Your pain does not get better after the rash heals.  Your rash looks infected. Signs of infection include:  Redness.  Swelling.  Pain that lasts or gets worse. Get help right away if:  The rash is on your face or nose.  You have pain in your face, pain around your eye area, or loss of feeling on one side of your face.  You have ear pain or you have ringing in your ear.  You have loss of taste.  Your condition gets worse. This information is not intended to replace advice given to you by your health care provider. Make sure you discuss any questions you have with your health care provider. Document Released: 10/25/2007 Document Revised: 01/02/2016 Document Reviewed: 02/17/2014 Elsevier Interactive Patient Education  2017 Elsevier Inc.  

## 2016-10-13 NOTE — Progress Notes (Signed)
   Subjective:    Patient ID: Edward Heath, male    DOB: 1945/07/16, 71 y.o.   MRN: [id number]  HPI Patient comes in today c/o rash on top of left foot - has been there for 2 weeks- says it itches and is painful . Started out looking like blisters- has used neosporin ointment on it,    Review of Systems  Constitutional: Negative.   Respiratory: Negative.   Cardiovascular: Negative.   Skin: Positive for rash (left foot).  Neurological: Negative.   Psychiatric/Behavioral: Negative.   All other systems reviewed and are negative.      Objective:   Physical Exam  Constitutional: He is oriented to person, place, and time. He appears well-developed and well-nourished. No distress.  Cardiovascular: Normal rate, regular rhythm and normal heart sounds.   Pulmonary/Chest: Effort normal and breath sounds normal.  Neurological: He is alert and oriented to person, place, and time.  Skin: Skin is warm.  Psychiatric: He has a normal mood and affect. His behavior is normal. Judgment and thought content normal.      BP 101/65   Pulse 82   Temp 97.3 F (36.3 C) (Oral)   Ht 5\' 8"  (1.727 m)   Wt 257 lb 3.2 oz (116.7 kg)   BMI 39.11 kg/m        Assessment & Plan:   1. Herpes zoster without complication    hydrocortisone anti itch cream OTC Avoid rubbing or scratching if possible Cool compresses Has had to long to treat with valtres RTO prn  Mary-Margaret Hassell Done, FNP

## 2016-11-06 DIAGNOSIS — Z8673 Personal history of transient ischemic attack (TIA), and cerebral infarction without residual deficits: Secondary | ICD-10-CM | POA: Diagnosis not present

## 2016-11-06 DIAGNOSIS — L570 Actinic keratosis: Secondary | ICD-10-CM | POA: Diagnosis not present

## 2016-11-06 DIAGNOSIS — L814 Other melanin hyperpigmentation: Secondary | ICD-10-CM | POA: Diagnosis not present

## 2016-11-06 DIAGNOSIS — L853 Xerosis cutis: Secondary | ICD-10-CM | POA: Diagnosis not present

## 2016-11-06 DIAGNOSIS — I781 Nevus, non-neoplastic: Secondary | ICD-10-CM | POA: Diagnosis not present

## 2016-11-06 DIAGNOSIS — L821 Other seborrheic keratosis: Secondary | ICD-10-CM | POA: Diagnosis not present

## 2016-11-06 DIAGNOSIS — Z85828 Personal history of other malignant neoplasm of skin: Secondary | ICD-10-CM | POA: Diagnosis not present

## 2016-11-06 DIAGNOSIS — Z9889 Other specified postprocedural states: Secondary | ICD-10-CM | POA: Diagnosis not present

## 2016-11-06 DIAGNOSIS — I429 Cardiomyopathy, unspecified: Secondary | ICD-10-CM | POA: Diagnosis not present

## 2016-11-06 DIAGNOSIS — E785 Hyperlipidemia, unspecified: Secondary | ICD-10-CM | POA: Diagnosis not present

## 2016-11-06 DIAGNOSIS — I2510 Atherosclerotic heart disease of native coronary artery without angina pectoris: Secondary | ICD-10-CM | POA: Diagnosis not present

## 2016-11-06 DIAGNOSIS — I110 Hypertensive heart disease with heart failure: Secondary | ICD-10-CM | POA: Diagnosis not present

## 2016-11-06 DIAGNOSIS — I509 Heart failure, unspecified: Secondary | ICD-10-CM | POA: Diagnosis not present

## 2016-11-27 DIAGNOSIS — F17228 Nicotine dependence, chewing tobacco, with other nicotine-induced disorders: Secondary | ICD-10-CM | POA: Diagnosis not present

## 2016-11-27 DIAGNOSIS — Z4502 Encounter for adjustment and management of automatic implantable cardiac defibrillator: Secondary | ICD-10-CM | POA: Diagnosis not present

## 2016-11-27 DIAGNOSIS — I255 Ischemic cardiomyopathy: Secondary | ICD-10-CM | POA: Diagnosis not present

## 2016-11-28 DIAGNOSIS — I255 Ischemic cardiomyopathy: Secondary | ICD-10-CM | POA: Diagnosis not present

## 2016-11-28 DIAGNOSIS — Z4502 Encounter for adjustment and management of automatic implantable cardiac defibrillator: Secondary | ICD-10-CM | POA: Diagnosis not present

## 2016-12-20 ENCOUNTER — Encounter: Payer: Self-pay | Admitting: Pharmacist

## 2016-12-20 ENCOUNTER — Ambulatory Visit (INDEPENDENT_AMBULATORY_CARE_PROVIDER_SITE_OTHER): Payer: Medicare Other | Admitting: Pharmacist

## 2016-12-20 VITALS — BP 100/60 | HR 63 | Ht 68.0 in | Wt 253.0 lb

## 2016-12-20 DIAGNOSIS — Z0000 Encounter for general adult medical examination without abnormal findings: Secondary | ICD-10-CM

## 2016-12-20 DIAGNOSIS — E1159 Type 2 diabetes mellitus with other circulatory complications: Secondary | ICD-10-CM

## 2016-12-20 NOTE — Progress Notes (Addendum)
Patient ID: Edward Heath, male   DOB: 08-21-1945, 71 y.o.   MRN: [id number]    Subjective:   Edward Heath is a 71 y.o. male who presents for a subsequent Medicare Annual Wellness Visit. Patient reports that he does not feel that his health has changed significantly over the last 12 months.  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 Marital history: Married Has 1 daughter.  His wife, daughter and adult grandson live with patient.   Patient is active and continues to golf 3 times per week Alcohol/Tobacco/Substances: no alcohol or substance use but patient does chew tobacco.  He declined tobacco cessation counseling today.  Current Medications (verified) Outpatient Encounter Prescriptions as of 12/20/2016  Medication Sig  . acetaminophen (TYLENOL) 650 MG CR tablet Take 650 mg by mouth.  Marland Kitchen apixaban (ELIQUIS) 5 MG TABS tablet Take 1 tablet (5 mg total) by mouth 2 (two) times daily. 2 BID X 7 days then 1 BID  . aspirin EC 81 MG tablet Take 81 mg by mouth.  Marland Kitchen atorvastatin (LIPITOR) 40 MG tablet Take 40 mg by mouth daily.    . Cholecalciferol (VITAMIN D-1000 MAX ST) 1000 units tablet Take 1 tablet (1,000 Units total) by mouth 2 (two) times daily.  . cyanocobalamin 1000 MCG tablet Take 1,000 mcg by mouth daily.  . ferrous sulfate 325 (65 FE) MG tablet Take 325 mg by mouth.  . furosemide (LASIX) 20 MG tablet TAKE (2) TABLETS DAILY (Patient taking differently: take 1 (20mg ) tablet daily)  . gabapentin (NEURONTIN) 300 MG capsule Take 300 mg by mouth 2 (two) times daily.  Marland Kitchen glipiZIDE (GLUCOTROL) 5 MG tablet Take 5 mg by mouth daily before breakfast.   . levothyroxine (SYNTHROID, LEVOTHROID) 125 MCG tablet Take 125 mcg by mouth daily before breakfast.  . lisinopril (PRINIVIL,ZESTRIL) 5 MG tablet Take 10 mg by mouth.  . Magnesium 250 MG TABS Take 500 mg by mouth 2 (two) times daily.   . meloxicam (MOBIC) 7.5 MG  tablet Take 7.5 mg by mouth daily.   . metFORMIN (GLUCOPHAGE) 1000 MG tablet Take 1,000 mg by mouth 2 (two) times daily with a meal.   . metoprolol succinate (TOPROL-XL) 100 MG 24 hr tablet Take 1 tablet (100 mg total) by mouth daily. Take with or immediately following a meal.  . Omega-3 1000 MG CAPS Take 2 g by mouth 2 (two) times daily.   Marland Kitchen omeprazole (PRILOSEC) 10 MG capsule Take 20 mg by mouth 2 (two) times daily.   . Potassium 95 MG TBCR Take 1 tablet by mouth 2 (two) times daily.   . Salicylic Acid 2 % CREA Apply 1 application topically 2 (two) times daily as needed.  Marland Kitchen spironolactone (ALDACTONE) 25 MG tablet Take 25 mg by mouth.  . vitamin E (E-400) 400 UNIT capsule Take 400 Units by mouth.  . nitroGLYCERIN (NITROSTAT) 0.4 MG SL tablet Place 0.4 mg under the tongue.  . [DISCONTINUED] amiodarone (PACERONE) 200 MG tablet Take 1 tablet (200 mg total) by mouth daily. (Patient not taking: Reported on 12/20/2016)  . [DISCONTINUED] cefdinir (OMNICEF) 300 MG capsule Take 1 capsule (300 mg total) by mouth 2 (two) times daily. 1 po BID (Patient not taking: Reported on 12/20/2016)  . [DISCONTINUED] docusate (COLACE) 60 MG/15ML syrup Take 60 mg by mouth daily.    . [DISCONTINUED] HYDROcodone-homatropine (HYCODAN) 5-1.5 MG/5ML syrup Take 5 mLs by mouth every 6 (  six) hours as needed for cough. (Patient not taking: Reported on 12/20/2016)  . [DISCONTINUED] traMADol (ULTRAM) 50 MG tablet Take 50 mg by mouth 2 (two) times daily.   No facility-administered encounter medications on file as of 12/20/2016.     Allergies (verified) Patient has no known allergies.   History: Past Medical History:  Diagnosis Date  . CHF (congestive heart failure) ([place])   . Diabetes mellitus without complication ([place])   . GERD (gastroesophageal reflux disease)   . Heart attack ([place]) 2004, 2015  . Hyperlipidemia   . Hypertension   . Lumbar vertebral fracture ([place]) 2012  . Skin cancer 06/2014   left cheek  . Stroke ([place])   .  Thyroid disease   . TIA (transient ischemic attack) 2009   Past Surgical History:  Procedure Laterality Date  . CARDIAC DEFIBRILLATOR PLACEMENT  12/2003, Repaired in 2012  . KNEE SURGERY Right 1994  . SKIN CANCER EXCISION  06/2014   Family History  Problem Relation Age of Onset  . Heart attack Mother   . Aneurysm Father   . Heart attack Brother 69   Social History   Occupational History  . Not on file.   Social History Main Topics  . Smoking status: Never Smoker  . Smokeless tobacco: Current User    Types: Chew  . Alcohol use Yes     Comment: occ  . Drug use: No  . Sexual activity: Yes    Do you feel safe at home?  Yes Are there smokers in your home (other than you)? No  Dietary issues and exercise activities discussed: Current Exercise Habits: Home exercise routine, Type of exercise: Other - see comments (golfs), Time (Minutes): 30, Frequency (Times/Week): 3, Weekly Exercise (Minutes/Week): 90, Intensity: Mild  Current Dietary habits:  Has diabetes and has been counseled on limiting CHO intake but he admits that he does not always follow low CHO diet.  He has 3 meals per day usually.     Cardiac Risk Factors include: advanced age (>27men, >12 women);diabetes mellitus;dyslipidemia;family history of premature cardiovascular disease;hypertension;male gender;obesity (BMI >30kg/m2);smoking/ tobacco exposure  Objective:    Today's Vitals   12/20/16 1019 12/20/16 1047  BP: 100/60   Pulse: 63   Weight: 253 lb (114.8 kg)   Height: 5\' 8"  (1.727 m)   PainSc: 0-No pain 4    Body mass index is 38.47 kg/m.  Diabetic Foot Form - Detailed   Diabetic Foot Exam - detailed Diabetic Foot exam was performed with the following findings:  Yes 12/20/2016 10:55 AM  Visual Foot Exam completed.:  Yes  Can the patient see the bottom of their feet?:  Yes Are the shoes appropriate in style and fit?:  Yes Is there swelling or and abnormal foot shape?:  Yes Is there a claw toe deformity?:   No Is there elevated skin temparature?:  No Is there foot or ankle muscle weakness?:  No Normal Range of Motion:  No Pulse Foot Exam completed.:  Yes  Right posterior Tibialias:  Diminished Left posterior Tibialias:  Diminished  Right Dorsalis Pedis:  Diminished Left Dorsalis Pedis:  Diminished  Sensory Foot Exam Completed.:  Yes Semmes-Weinstein Monofilament Test R Site 1-Great Toe:  Neg L Site 1-Great Toe:  Neg    Comments:  Callus noted on right great toe     Activities of Daily Living In your present state of health, do you have any difficulty performing the following activities: 12/20/2016  Hearing? Y  Comment has hearing aids  but not wearing today  Vision? N  Difficulty concentrating or making decisions? N  Walking or climbing stairs? N  Dressing or bathing? N  Doing errands, shopping? N  Preparing Food and eating ? N  Using the Toilet? N  In the past six months, have you accidently leaked urine? N  Do you have problems with loss of bowel control? N  Managing your Medications? N  Managing your Finances? N  Housekeeping or managing your Housekeeping? N  Some recent data might be hidden     Depression Screen PHQ 2/9 Scores 12/20/2016 10/13/2016 08/07/2016 07/05/2016  PHQ - 2 Score 1 0 0 0  PHQ- 9 Score - - - -  Exception Documentation - - - -     Fall Risk Fall Risk  12/20/2016 10/13/2016 08/07/2016 07/05/2016 05/17/2016  Falls in the past year? No Yes Yes Yes Yes  Number falls in past yr: - 1 2 or more 2 or more 1  Injury with Fall? - No Yes Yes Yes  Comment - - - - 3 broken fingers  Risk for fall due to : - - - - -  Risk for fall due to: Comment - - - - -  Follow up - - - - -    Cognitive Function: MMSE - Mini Mental State Exam 12/20/2016 12/14/2015 08/05/2014  Orientation to time 5 5 5   Orientation to Place 5 5 5   Registration 3 3 3   Attention/ Calculation 4 4 4   Recall 3 2 2   Language- name 2 objects 2 2 2   Language- repeat 1 1 1   Language- follow 3 step command 3  3 3   Language- read & follow direction 1 1 1   Write a sentence 1 1 1   Copy design 1 0 1  Total score 29 27 28     Immunizations and Health Maintenance Immunization History  Administered Date(s) Administered  . Influenza, High Dose Seasonal PF 03/06/2013, 01/26/2015  . Influenza-Unspecified 03/22/2014, 02/17/2016  . Pneumococcal Conjugate-13 03/22/2014  . Pneumococcal Polysaccharide-23 03/22/2013  . Zoster 11/19/2013   Health Maintenance Due  Topic Date Due  . Hepatitis C Screening  [birth year]  . FOOT EXAM  12/10/1954  . OPHTHALMOLOGY EXAM  12/10/1954  . TETANUS/TDAP  12/10/1963  . COLON CANCER SCREENING ANNUAL FOBT  12/10/1994  . INFLUENZA VACCINE  12/20/2016    Patient Care Team: Claretta Fraise, MD as PCP - General (Family Medicine) Frederik Pear Windy Fast, MD as Consulting Physician (Dermatology) Loney Loh, MD as Consulting Physician (Dermatology) Daryel November, MD as Consulting Physician (Urology) Louanna Raw, MD as Referring Physician (Diagnostic Radiology)   Patient does to [facility] in [place] and [place] for most of his medical care.  Indicate any recent Medical Services you may have received from other than [facility] providers in the past year (date may be approximate).    Assessment:    Annual Wellness Visit    Screening Tests Health Maintenance  Topic Date Due  . Hepatitis C Screening  15-Mar-1946  . FOOT EXAM  12/10/1954  . OPHTHALMOLOGY EXAM  12/10/1954  . TETANUS/TDAP  12/10/1963  . COLON CANCER SCREENING ANNUAL FOBT  12/10/1994  . INFLUENZA VACCINE  12/20/2016  . HEMOGLOBIN A1C  02/15/2017  . DEXA SCAN  02/02/2026  . PNA vac Low Risk Adult  Completed        Plan:   During the course of the visit Ariq was educated and counseled about the following appropriate screening and preventive services:  Vaccines to include Pneumoccal, Influenza,  Td, and Shingles - patient to request immunization records from [place] to assess if he  has received Tdap.   Colorectal cancer screening - per pt has been done - asked that he bring in [facility]  Cardiovascular disease screening  Diabetes - last A1c was 8.1% - followed by [place] urine micro albumin today  Glaucoma screening / Diabetic Eye Exam - requested notes from last visit from [place]  Nutrition counseling - reviewed CHO limiting diet. Increase lean protein, fruits and non starchy vegetables.  Tobacco cessation counseling - patient declined  Advanced Directives - declined  Physical Activity continue to exercise weekly   Discussed Heptatis C screening - patient declined today.  Foot exam performed today and educated patient on proper foot care.  [facility].   Goals    . Weight (lb) < 225 lb (102.1 kg)          Initially would like to see decreased in weight of 10%       Orders Placed This Encounter  Procedures  . Microalbumin / creatinine urine ratio     Patient Instructions (the written plan) were given to the patient.   Cherre Robins, PharmD   12/20/2016     I have reviewed and agree with the above AWV documentation.  Claretta Fraise, M.D.

## 2016-12-20 NOTE — Patient Instructions (Addendum)
  Mr. Creger , Thank you for taking time to come for your Medicare Wellness Visit. I appreciate your ongoing commitment to your health goals. Please review the following plan we discussed and let me know if I can assist you in the future.   These are the goals we discussed: Recommend ask about getting Hepatitis C checked with next labs at cardiologist appointment or when going to [place].   Request records of last eye exam and colonoscopy from [place].    Increase non-starchy vegetables - carrots, green bean, squash, zucchini, tomatoes, onions, peppers, spinach and other green leafy vegetables, cabbage, lettuce, cucumbers, asparagus, okra (not fried), eggplant Limit sugar and processed foods (cakes, cookies, ice cream, crackers and chips) Increase fresh fruit but limit serving sizes 1/2 cup or about the size of tennis or baseball Limit red meat to no more than 1-2 times per week (serving size about the size of your palm) Choose whole grains / lean proteins - whole wheat bread, quinoa, whole grain rice (1/2 cup), fish, chicken, [place] Avoid sugar and calorie containing beverages - soda, sweet tea and juice.  Choose water or unsweetened tea instead.    This is a list of the screening recommended for you and due dates:  Health Maintenance  Topic Date Due  .  Hepatitis C: One time screening is recommended by Center for Disease Control  (CDC) for  adults born from [birth year] through 1966.   [birth year]  . Complete foot exam   12/10/1954  . Eye exam for diabetics  12/10/1954  . Tetanus Vaccine  12/10/1963  . Stool Blood Test  12/10/1994  . Flu Shot  12/20/2016  . Hemoglobin A1C  02/15/2017  . DEXA scan (bone density measurement)  02/02/2026  . Pneumonia vaccines  Completed

## 2016-12-21 LAB — MICROALBUMIN / CREATININE URINE RATIO
Creatinine, Urine: 87.8 mg/dL
Microalb/Creat Ratio: 4.6 mg/g creat (ref 0.0–30.0)
Microalbumin, Urine: 4 ug/mL

## 2017-03-12 DIAGNOSIS — I255 Ischemic cardiomyopathy: Secondary | ICD-10-CM | POA: Diagnosis not present

## 2017-03-13 DIAGNOSIS — I255 Ischemic cardiomyopathy: Secondary | ICD-10-CM | POA: Diagnosis not present

## 2017-03-13 DIAGNOSIS — Z4502 Encounter for adjustment and management of automatic implantable cardiac defibrillator: Secondary | ICD-10-CM | POA: Diagnosis not present

## 2017-06-12 DIAGNOSIS — I255 Ischemic cardiomyopathy: Secondary | ICD-10-CM | POA: Diagnosis not present

## 2017-06-12 DIAGNOSIS — Z4502 Encounter for adjustment and management of automatic implantable cardiac defibrillator: Secondary | ICD-10-CM | POA: Diagnosis not present

## 2017-06-13 DIAGNOSIS — Z85828 Personal history of other malignant neoplasm of skin: Secondary | ICD-10-CM | POA: Diagnosis not present

## 2017-06-13 DIAGNOSIS — C44219 Basal cell carcinoma of skin of left ear and external auricular canal: Secondary | ICD-10-CM | POA: Diagnosis not present

## 2017-06-13 DIAGNOSIS — L570 Actinic keratosis: Secondary | ICD-10-CM | POA: Diagnosis not present

## 2017-06-13 DIAGNOSIS — L821 Other seborrheic keratosis: Secondary | ICD-10-CM | POA: Diagnosis not present

## 2017-06-13 DIAGNOSIS — L853 Xerosis cutis: Secondary | ICD-10-CM | POA: Diagnosis not present

## 2017-06-13 DIAGNOSIS — C44319 Basal cell carcinoma of skin of other parts of face: Secondary | ICD-10-CM | POA: Diagnosis not present

## 2017-06-25 DIAGNOSIS — Z8673 Personal history of transient ischemic attack (TIA), and cerebral infarction without residual deficits: Secondary | ICD-10-CM | POA: Diagnosis not present

## 2017-07-04 DIAGNOSIS — L821 Other seborrheic keratosis: Secondary | ICD-10-CM | POA: Diagnosis not present

## 2017-07-04 DIAGNOSIS — C44319 Basal cell carcinoma of skin of other parts of face: Secondary | ICD-10-CM | POA: Diagnosis not present

## 2017-07-26 DIAGNOSIS — I5022 Chronic systolic (congestive) heart failure: Secondary | ICD-10-CM | POA: Diagnosis not present

## 2017-07-26 DIAGNOSIS — I429 Cardiomyopathy, unspecified: Secondary | ICD-10-CM | POA: Diagnosis not present

## 2017-07-26 DIAGNOSIS — I255 Ischemic cardiomyopathy: Secondary | ICD-10-CM | POA: Diagnosis not present

## 2017-07-26 DIAGNOSIS — I10 Essential (primary) hypertension: Secondary | ICD-10-CM | POA: Diagnosis not present

## 2017-07-26 DIAGNOSIS — E785 Hyperlipidemia, unspecified: Secondary | ICD-10-CM | POA: Diagnosis not present

## 2017-07-26 DIAGNOSIS — Z4502 Encounter for adjustment and management of automatic implantable cardiac defibrillator: Secondary | ICD-10-CM | POA: Diagnosis not present

## 2017-07-26 DIAGNOSIS — Z7901 Long term (current) use of anticoagulants: Secondary | ICD-10-CM | POA: Diagnosis not present

## 2017-07-26 DIAGNOSIS — I480 Paroxysmal atrial fibrillation: Secondary | ICD-10-CM | POA: Diagnosis not present

## 2017-07-26 DIAGNOSIS — Z95810 Presence of automatic (implantable) cardiac defibrillator: Secondary | ICD-10-CM | POA: Diagnosis not present

## 2017-07-28 DIAGNOSIS — I255 Ischemic cardiomyopathy: Secondary | ICD-10-CM | POA: Diagnosis not present

## 2017-07-28 DIAGNOSIS — Z4502 Encounter for adjustment and management of automatic implantable cardiac defibrillator: Secondary | ICD-10-CM | POA: Diagnosis not present

## 2017-08-08 DIAGNOSIS — H538 Other visual disturbances: Secondary | ICD-10-CM | POA: Diagnosis not present

## 2017-08-08 DIAGNOSIS — Z8673 Personal history of transient ischemic attack (TIA), and cerebral infarction without residual deficits: Secondary | ICD-10-CM | POA: Diagnosis not present

## 2017-08-08 DIAGNOSIS — H5711 Ocular pain, right eye: Secondary | ICD-10-CM | POA: Diagnosis not present

## 2017-08-08 DIAGNOSIS — H02401 Unspecified ptosis of right eyelid: Secondary | ICD-10-CM | POA: Diagnosis not present

## 2017-08-08 DIAGNOSIS — Z7901 Long term (current) use of anticoagulants: Secondary | ICD-10-CM | POA: Diagnosis not present

## 2017-08-08 DIAGNOSIS — I252 Old myocardial infarction: Secondary | ICD-10-CM | POA: Diagnosis not present

## 2017-08-08 DIAGNOSIS — R51 Headache: Secondary | ICD-10-CM | POA: Diagnosis not present

## 2017-08-08 DIAGNOSIS — I6503 Occlusion and stenosis of bilateral vertebral arteries: Secondary | ICD-10-CM | POA: Diagnosis not present

## 2017-08-10 ENCOUNTER — Telehealth: Payer: Self-pay | Admitting: Family Medicine

## 2017-08-13 DIAGNOSIS — R51 Headache: Secondary | ICD-10-CM | POA: Diagnosis not present

## 2017-08-13 NOTE — Telephone Encounter (Signed)
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 Have him come in for consultation if he hazs further questions. He is overdue for a checkup anyway.

## 2017-08-13 NOTE — Telephone Encounter (Signed)
Patient notified of CT report and is coming in Wednesday to discuss further with Dr Livia Snellen

## 2017-08-15 ENCOUNTER — Encounter: Payer: Self-pay | Admitting: Family Medicine

## 2017-08-15 ENCOUNTER — Ambulatory Visit (INDEPENDENT_AMBULATORY_CARE_PROVIDER_SITE_OTHER): Payer: Medicare Other | Admitting: Family Medicine

## 2017-08-15 VITALS — BP 96/51 | HR 70 | Temp 98.1°F | Ht 68.0 in | Wt 252.5 lb

## 2017-08-15 DIAGNOSIS — I6502 Occlusion and stenosis of left vertebral artery: Secondary | ICD-10-CM

## 2017-08-15 DIAGNOSIS — G459 Transient cerebral ischemic attack, unspecified: Secondary | ICD-10-CM | POA: Diagnosis not present

## 2017-08-15 NOTE — Progress Notes (Signed)
Subjective:  Patient ID: EMMIT ORILEY, male    DOB: 1946-04-13  Age: 72 y.o. MRN: [id number]  CC: Follow-up (pt here today to discuss CT)   HPI Dandrae D Manninen presents for follow-up of hospitalization.  He was recently admitted for possible stroke.  Primarily he had pain at the right temple and visual changes.  There was no associated numbness as well.  The numbness did not involve the extremities.  He continues to have headache in that region.  He was evaluated for giant cell arteritis.  He did a follow-up at [facility] 2 days ago for lab evaluation including a CRP and sed rate.  By patient's history those were normal.  When he was initially seen 5 days ago he had an MRI performed.  He was told that the MRI was normal.  However the vertebrals were noted to have blockages.  He is here today to discuss those.  The impressions portion of the MRI report is copied and pasted below.  This was reviewed with the patient in detail.  He understands the implications of a blocked artery.  In layman's terms I discussed with the presence of the circle of Willis and collateral circulation.  He understands that the 50% blockage of the right arterial is not occlusive.  The high-grade stenosis of the left is proximal and likely to be compensated by the circle of Willis.  Additionally he takes Eliquis already for his atrial fibrillation.  Depression screen [facility] 2/9 12/20/2016 10/13/2016 08/07/2016  Decreased Interest 1 0 0  Down, Depressed, Hopeless 0 0 0  PHQ - 2 Score 1 0 0  Altered sleeping - - -  Tired, decreased energy - - -  Change in appetite - - -  Feeling bad or failure about yourself  - - -  Trouble concentrating - - -  Moving slowly or fidgety/restless - - -  Suicidal thoughts - - -  PHQ-9 Score - - -  Difficult doing work/chores - - -    History Ronn has a past medical history of CHF (congestive heart failure) ([place]), Diabetes mellitus without complication ([place]), GERD (gastroesophageal  reflux disease), Heart attack ([place]) (2004, 2015), Hyperlipidemia, Hypertension, Lumbar vertebral fracture ([place]) (2012), Skin cancer (06/2014), Stroke [facility]), Thyroid disease, and TIA (transient ischemic attack) (2009).   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   His family history includes Aneurysm in his father; Heart attack in his mother; Heart attack (age of onset: 52) in his brother.He reports that he has never smoked. His smokeless tobacco use includes chew. He reports that he drinks alcohol. He reports that he does not use drugs.    ROS Review of Systems  Constitutional: Negative for chills, diaphoresis and fever.  HENT: Negative for rhinorrhea and sore throat.   Eyes: Positive for visual disturbance.  Respiratory: Negative for cough and shortness of breath.   Cardiovascular: Negative for chest pain.  Gastrointestinal: Negative for abdominal pain.  Musculoskeletal: Negative for arthralgias and myalgias.  Skin: Negative for rash.  Neurological: Positive for facial asymmetry and headaches. Negative for weakness.    Objective:  BP (!) 96/51   Pulse 70   Temp 98.1 F (36.7 C) (Oral)   Ht 5\' 8"  (1.727 m)   Wt 252 lb 8 oz (114.5 kg)   BMI 38.39 kg/m   BP Readings from Last 3 Encounters:  08/15/17 (!) 96/51  12/20/16 100/60  10/13/16 101/65    Wt Readings from Last 3 Encounters:  08/15/17 252 lb 8 oz (114.5 kg)  12/20/16 253 lb (114.8 kg)  10/13/16 257 lb 3.2 oz (116.7 kg)     Physical Exam  Constitutional: He appears well-developed and well-nourished. No distress.  HENT:  Head: Normocephalic.  Eyes: Pupils are equal, round, and reactive to light. EOM are normal. Right eye exhibits no discharge. Left eye exhibits no discharge.  Neck: Normal range of motion. Neck supple.  Cardiovascular: Normal rate.  Pulmonary/Chest: Effort normal and breath  sounds normal.  Abdominal: Soft. Bowel sounds are normal.  Musculoskeletal: Normal range of motion.  Neurological: He is alert.  Skin: Skin is warm and dry.  Psychiatric: He has a normal mood and affect. His behavior is normal.    1. No acute arterial abnormality in the head and neck. 2.High-grade stenoses involving the origin and proximal left vertebral artery secondary to mixed calcified and noncalcified atherosclerotic plaque. 3.Greater than 50% stenosis of the intradural right vertebral artery proximal to the PICA origin to noncalcified atherosclerotic plaque. 4.Atherosclerotic plaque along both carotid bifurcations, proximal internal carotid arteries, and intracranial carotid siphons with less than 50% stenosis. However, there is irregularity of the intima involving the proximal cervical left ICA suggesting plaque ulceration and indicating potential plaque vulnerability. 5.No acute intracranial abnormal caliber noncontrast CT. Although no evidence of acute infarction, mass, or hemorrhage is seen, CT is relatively insensitive for the detection of hypoxia/ischemia within the first 24-48 hours, and an MRI scan may be indicated.  Assessment & Plan:   Sagan was seen today for follow-up.  Diagnoses and all orders for this visit:  Vertebral artery stenosis, left  TIA (transient ischemic attack)    Patient was reassured today that the Eliquis is the best treatment available for TIA and the vertebral artery stenosis.  He should check in with his neurologist however for verification.  30 minutes was spent in consultation and examination with this patient more than half was spent in discussion of his MRI report, his recent medical symptoms and their implications.   I have discontinued Aleksei D. Kayes's glipiZIDE. I am also having him maintain his omeprazole, atorvastatin, aspirin EC, Magnesium, metFORMIN, nitroGLYCERIN, Omega-3, Potassium, vitamin E, gabapentin,  Cholecalciferol, apixaban, metoprolol succinate, ferrous sulfate, cyanocobalamin, furosemide, Salicylic Acid, acetaminophen, meloxicam, spironolactone, lisinopril, levothyroxine, and insulin glargine.  Allergies as of 08/15/2017   No Known Allergies     Medication List        Accurate as of 08/15/17  3:04 PM. Always use your most recent med list.          acetaminophen 650 MG CR tablet Commonly known as:  TYLENOL Take 650 mg by mouth.   apixaban 5 MG Tabs tablet Commonly known as:  ELIQUIS Take 1 tablet (5 mg total) by mouth 2 (two) times daily. 2 BID X 7 days then 1 BID   aspirin EC 81 MG tablet Take 81 mg by mouth.   atorvastatin 40 MG tablet Commonly known as:  LIPITOR Take 40 mg by mouth daily.   Cholecalciferol 1000 units tablet Commonly known as:  VITAMIN D-1000 MAX ST Take 1 tablet (1,000 Units total) by mouth 2 (two) times daily.   cyanocobalamin 1000 MCG tablet Take 1,000 mcg by mouth daily.   E-400 400 UNIT capsule Generic drug:  vitamin E Take 400 Units by mouth.   ferrous sulfate 325 (65 FE) MG tablet Take 325 mg by mouth.   furosemide  20 MG tablet Commonly known as:  LASIX TAKE (2) TABLETS DAILY   gabapentin 300 MG capsule Commonly known as:  NEURONTIN Take 300 mg by mouth 2 (two) times daily.   LANTUS 100 UNIT/ML injection Generic drug:  insulin glargine Inject 40 Units into the skin at bedtime.   levothyroxine 125 MCG tablet Commonly known as:  SYNTHROID, LEVOTHROID Take 125 mcg by mouth daily before breakfast.   lisinopril 5 MG tablet Commonly known as:  PRINIVIL,ZESTRIL Take 10 mg by mouth.   Magnesium 250 MG Tabs Take 500 mg by mouth 2 (two) times daily.   meloxicam 7.5 MG tablet Commonly known as:  MOBIC Take 7.5 mg by mouth daily.   metFORMIN 1000 MG tablet Commonly known as:  GLUCOPHAGE Take 1,000 mg by mouth 2 (two) times daily with a meal.   metoprolol succinate 100 MG 24 hr tablet Commonly known as:  TOPROL-XL Take 1  tablet (100 mg total) by mouth daily. Take with or immediately following a meal.   nitroGLYCERIN 0.4 MG SL tablet Commonly known as:  NITROSTAT Place 0.4 mg under the tongue.   Omega-3 1000 MG Caps Take 2 g by mouth 2 (two) times daily.   omeprazole 10 MG capsule Commonly known as:  PRILOSEC Take 20 mg by mouth 2 (two) times daily.   Potassium 95 MG Tbcr Take 1 tablet by mouth 2 (two) times daily.   Salicylic Acid 2 % Crea Apply 1 application topically 2 (two) times daily as needed.   spironolactone 25 MG tablet Commonly known as:  ALDACTONE Take 25 mg by mouth.        Follow-up: No follow-ups on file.  Patient is receiving his routine care from the [facility] hospital for his diabetes etc. at this point in time  Claretta Fraise, M.D.

## 2017-08-28 DIAGNOSIS — Z794 Long term (current) use of insulin: Secondary | ICD-10-CM | POA: Diagnosis not present

## 2017-08-28 DIAGNOSIS — E119 Type 2 diabetes mellitus without complications: Secondary | ICD-10-CM | POA: Diagnosis not present

## 2017-08-28 DIAGNOSIS — Z4502 Encounter for adjustment and management of automatic implantable cardiac defibrillator: Secondary | ICD-10-CM | POA: Diagnosis not present

## 2017-08-28 DIAGNOSIS — I480 Paroxysmal atrial fibrillation: Secondary | ICD-10-CM | POA: Diagnosis not present

## 2017-09-21 ENCOUNTER — Ambulatory Visit (INDEPENDENT_AMBULATORY_CARE_PROVIDER_SITE_OTHER): Payer: Medicare Other | Admitting: Family Medicine

## 2017-09-21 ENCOUNTER — Encounter: Payer: Self-pay | Admitting: Family Medicine

## 2017-09-21 VITALS — BP 96/56 | HR 73 | Ht 68.0 in | Wt 249.2 lb

## 2017-09-21 DIAGNOSIS — S39012A Strain of muscle, fascia and tendon of lower back, initial encounter: Secondary | ICD-10-CM | POA: Diagnosis not present

## 2017-09-21 DIAGNOSIS — I6502 Occlusion and stenosis of left vertebral artery: Secondary | ICD-10-CM | POA: Diagnosis not present

## 2017-09-21 MED ORDER — CELECOXIB 200 MG PO CAPS: 200.0000 mg | ORAL_CAPSULE | Freq: Every day | ORAL | 5 refills | Status: DC

## 2017-09-21 MED ORDER — CYCLOBENZAPRINE HCL 10 MG PO TABS: 10.0000 mg | ORAL_TABLET | Freq: Three times a day (TID) | ORAL | 1 refills | Status: DC | PRN

## 2017-09-21 NOTE — Progress Notes (Signed)
Subjective:  Patient ID: Edward Heath, male    DOB: [birth year]  Age: 72 y.o. MRN: [id number]  CC: Back Pain (pt here today c/o lower right side back pain since bending down to wash his feet in the shower and "got a catch" )   HPI Braison D Reeb presents for low back pain for 2 days.  Onset was after mowing 2 days ago.  He was taking a shower and bent over to wash his feet and felt a sudden pain in the right side of his lower back.  He points to the right sciatic notch region and then to the paraspinous musculature approximately L5 and says it radiates toward he lateral aspect of the back.  However it does not go into the left side nor does it radiate to the abdomen.  It is moderately severe.  He states that he is resting at home in his recliner and has trouble getting out of the recliner because of the pain.  He has not taken any medication for this problem.  Patient did recently, just a few weeks ago, have his third defibrillator implanted.  Depression screen [facility] 2/9 09/21/2017 12/20/2016 10/13/2016  Decreased Interest 0 1 0  Down, Depressed, Hopeless 0 0 0  PHQ - 2 Score 0 1 0  Altered sleeping - - -  Tired, decreased energy - - -  Change in appetite - - -  Feeling bad or failure about yourself  - - -  Trouble concentrating - - -  Moving slowly or fidgety/restless - - -  Suicidal thoughts - - -  PHQ-9 Score - - -  Difficult doing work/chores - - -    History Cord has a past medical history of CHF (congestive heart failure) ([place]), Diabetes mellitus without complication ([place]), GERD (gastroesophageal reflux disease), Heart attack ([place]) (2004, 2015), Hyperlipidemia, Hypertension, Lumbar vertebral fracture ([place]) (2012), Skin cancer (06/2014), Stroke [facility]), Thyroid disease, and TIA (transient ischemic attack) (2009).   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   His  family history includes Aneurysm in his father; Heart attack in his mother; Heart attack (age of onset: 8) in his brother.He reports that he has never smoked. His smokeless tobacco use includes chew. He reports that he drinks alcohol. He reports that he does not use drugs.    ROS Review of Systems  Constitutional: Negative for chills, diaphoresis and fever.  HENT: Negative for sore throat.   Respiratory: Negative for shortness of breath.   Cardiovascular: Negative for chest pain.  Gastrointestinal: Negative for abdominal pain.  Musculoskeletal: Positive for arthralgias, back pain and myalgias. Negative for neck pain.  Skin: Negative for rash.  Neurological: Positive for weakness. Negative for light-headedness and numbness.    Objective:  BP (!) 96/56   Pulse 73   Ht 5\' 8"  (1.727 m)   Wt 249 lb 4 oz (113.1 kg)   BMI 37.90 kg/m   BP Readings from Last 3 Encounters:  09/21/17 (!) 96/56  08/15/17 (!) 96/51  12/20/16 100/60    Wt Readings from Last 3 Encounters:  09/21/17 249 lb 4 oz (113.1 kg)  08/15/17 252 lb 8 oz (114.5 kg)  12/20/16 253 lb (114.8 kg)     Physical Exam  Constitutional: He is oriented to person, place, and time. He appears well-developed and well-nourished. He appears distressed.  HENT:  Head: Normocephalic.  Eyes: Pupils are  equal, round, and reactive to light.  Neck: Normal range of motion.  Cardiovascular: Normal rate, regular rhythm and normal heart sounds.  No murmur heard. Pulmonary/Chest: Effort normal and breath sounds normal.  Abdominal: There is no tenderness.  Musculoskeletal: He exhibits tenderness (Primarily noted at the right lumbar L5 paraspinous musculature.  Also tender at the right sciatic notch.  There is a markedly positive right straight leg raise).  Neurological: He is alert and oriented to person, place, and time. He has normal reflexes.  Skin: Skin is warm and dry.  Psychiatric: His behavior is normal. Thought content normal.    Vitals reviewed.     Assessment & Plan:   Aristeo was seen today for back pain.  Diagnoses and all orders for this visit:  Lumbar strain, initial encounter  Other orders -     celecoxib (CELEBREX) 200 MG capsule; Take 1 capsule (200 mg total) by mouth daily. With food -     cyclobenzaprine (FLEXERIL) 10 MG tablet; Take 1 tablet (10 mg total) by mouth 3 (three) times daily as needed for muscle spasms.       I am having Shaya D. Tufano start on celecoxib and cyclobenzaprine. I am also having him maintain his omeprazole, atorvastatin, aspirin EC, Magnesium, metFORMIN, nitroGLYCERIN, Omega-3, Potassium, vitamin E, gabapentin, Cholecalciferol, apixaban, metoprolol succinate, ferrous sulfate, cyanocobalamin, furosemide, Salicylic Acid, acetaminophen, meloxicam, spironolactone, lisinopril, levothyroxine, insulin glargine, and insulin aspart protamine- aspart.  Allergies as of 09/21/2017   No Known Allergies     Medication List        Accurate as of 09/21/17  2:11 PM. Always use your most recent med list.          acetaminophen 650 MG CR tablet Commonly known as:  TYLENOL Take 650 mg by mouth.   apixaban 5 MG Tabs tablet Commonly known as:  ELIQUIS Take 1 tablet (5 mg total) by mouth 2 (two) times daily. 2 BID X 7 days then 1 BID   aspirin EC 81 MG tablet Take 81 mg by mouth.   atorvastatin 40 MG tablet Commonly known as:  LIPITOR Take 40 mg by mouth daily.   celecoxib 200 MG capsule Commonly known as:  CELEBREX Take 1 capsule (200 mg total) by mouth daily. With food   Cholecalciferol 1000 units tablet Commonly known as:  VITAMIN D-1000 MAX ST Take 1 tablet (1,000 Units total) by mouth 2 (two) times daily.   cyanocobalamin 1000 MCG tablet Take 1,000 mcg by mouth daily.   cyclobenzaprine 10 MG tablet Commonly known as:  FLEXERIL Take 1 tablet (10 mg total) by mouth 3 (three) times daily as needed for muscle spasms.   E-400 400 UNIT capsule Generic drug:   vitamin E Take 400 Units by mouth.   ferrous sulfate 325 (65 FE) MG tablet Take 325 mg by mouth.   furosemide 20 MG tablet Commonly known as:  LASIX TAKE (2) TABLETS DAILY   gabapentin 300 MG capsule Commonly known as:  NEURONTIN Take 300 mg by mouth 2 (two) times daily.   insulin aspart protamine- aspart (70-30) 100 UNIT/ML injection Commonly known as:  NOVOLOG MIX 70/30 Inject into the skin.   LANTUS 100 UNIT/ML injection Generic drug:  insulin glargine Inject 40 Units into the skin at bedtime.   levothyroxine 125 MCG tablet Commonly known as:  SYNTHROID, LEVOTHROID Take 125 mcg by mouth daily before breakfast.   lisinopril 5 MG tablet Commonly known as:  PRINIVIL,ZESTRIL Take 10 mg by mouth.  Magnesium 250 MG Tabs Take 500 mg by mouth 2 (two) times daily.   meloxicam 7.5 MG tablet Commonly known as:  MOBIC Take 7.5 mg by mouth daily.   metFORMIN 1000 MG tablet Commonly known as:  GLUCOPHAGE Take 1,000 mg by mouth 2 (two) times daily with a meal.   metoprolol succinate 100 MG 24 hr tablet Commonly known as:  TOPROL-XL Take 1 tablet (100 mg total) by mouth daily. Take with or immediately following a meal.   nitroGLYCERIN 0.4 MG SL tablet Commonly known as:  NITROSTAT Place 0.4 mg under the tongue.   Omega-3 1000 MG Caps Take 2 g by mouth 2 (two) times daily.   omeprazole 10 MG capsule Commonly known as:  PRILOSEC Take 20 mg by mouth 2 (two) times daily.   Potassium 95 MG Tbcr Take 1 tablet by mouth 2 (two) times daily.   Salicylic Acid 2 % Crea Apply 1 application topically 2 (two) times daily as needed.   spironolactone 25 MG tablet Commonly known as:  ALDACTONE Take 25 mg by mouth.        Follow-up: Return in about 2 weeks (around 10/05/2017).  Claretta Fraise, M.D.

## 2017-09-21 NOTE — Patient Instructions (Signed)

## 2017-12-06 ENCOUNTER — Other Ambulatory Visit: Payer: Self-pay | Admitting: Family Medicine

## 2017-12-06 ENCOUNTER — Ambulatory Visit (INDEPENDENT_AMBULATORY_CARE_PROVIDER_SITE_OTHER): Payer: Medicare Other | Admitting: Family Medicine

## 2017-12-06 ENCOUNTER — Encounter: Payer: Self-pay | Admitting: Family Medicine

## 2017-12-06 ENCOUNTER — Ambulatory Visit (HOSPITAL_COMMUNITY)
Admission: RE | Admit: 2017-12-06 | Discharge: 2017-12-06 | Disposition: A | Discharge status: Home / Self Care | Payer: Medicare Other | Source: Ambulatory Visit | Attending: Family Medicine | Admitting: Family Medicine

## 2017-12-06 VITALS — BP 106/70 | HR 100 | Temp 97.0°F | Ht 68.0 in | Wt 249.4 lb

## 2017-12-06 DIAGNOSIS — R1032 Left lower quadrant pain: Secondary | ICD-10-CM

## 2017-12-06 DIAGNOSIS — K4090 Unilateral inguinal hernia, without obstruction or gangrene, not specified as recurrent: Secondary | ICD-10-CM | POA: Diagnosis not present

## 2017-12-06 DIAGNOSIS — I6502 Occlusion and stenosis of left vertebral artery: Secondary | ICD-10-CM | POA: Diagnosis not present

## 2017-12-06 DIAGNOSIS — R109 Unspecified abdominal pain: Secondary | ICD-10-CM | POA: Diagnosis not present

## 2017-12-06 DIAGNOSIS — K5732 Diverticulitis of large intestine without perforation or abscess without bleeding: Secondary | ICD-10-CM | POA: Diagnosis not present

## 2017-12-06 DIAGNOSIS — K8020 Calculus of gallbladder without cholecystitis without obstruction: Secondary | ICD-10-CM | POA: Diagnosis not present

## 2017-12-06 DIAGNOSIS — I700 Atherosclerosis of aorta: Secondary | ICD-10-CM | POA: Insufficient documentation

## 2017-12-06 DIAGNOSIS — M48061 Spinal stenosis, lumbar region without neurogenic claudication: Secondary | ICD-10-CM | POA: Diagnosis not present

## 2017-12-06 LAB — CBC WITH DIFFERENTIAL/PLATELET
Basophils Absolute: 0 10*3/uL (ref 0.0–0.2)
Basos: 0 %
EOS (ABSOLUTE): 0.1 10*3/uL (ref 0.0–0.4)
Eos: 1 %
Hematocrit: 40 % (ref 37.5–51.0)
Hemoglobin: 12.6 g/dL — ABNORMAL LOW (ref 13.0–17.7)
Lymphocytes Absolute: 1.9 10*3/uL (ref 0.7–3.1)
Lymphs: 14 %
MCH: 25.9 pg — ABNORMAL LOW (ref 26.6–33.0)
MCHC: 31.5 g/dL (ref 31.5–35.7)
MCV: 82 fL (ref 79–97)
Monocytes Absolute: 1.3 10*3/uL — ABNORMAL HIGH (ref 0.1–0.9)
Monocytes: 10 %
Neutrophils Absolute: 9.9 10*3/uL — ABNORMAL HIGH (ref 1.4–7.0)
Neutrophils: 75 %
Platelets: 259 10*3/uL (ref 150–450)
RBC: 4.87 x10E6/uL (ref 4.14–5.80)
RDW: 16.4 % — ABNORMAL HIGH (ref 12.3–15.4)
WBC: 13.2 10*3/uL — ABNORMAL HIGH (ref 3.4–10.8)

## 2017-12-06 LAB — BMP8+EGFR
BUN/Creatinine Ratio: 10 (ref 10–24)
BUN: 10 mg/dL (ref 8–27)
CO2: 25 mmol/L (ref 20–29)
Calcium: 8.9 mg/dL (ref 8.6–10.2)
Chloride: 100 mmol/L (ref 96–106)
Creatinine, Ser: 0.98 mg/dL (ref 0.76–1.27)
GFR calc Af Amer: 89 mL/min/{1.73_m2} (ref >59)
GFR calc non Af Amer: 77 mL/min/{1.73_m2} (ref >59)
Glucose: 388 mg/dL — ABNORMAL HIGH (ref 65–99)
Potassium: 4.3 mmol/L (ref 3.5–5.2)
Sodium: 134 mmol/L (ref 134–144)

## 2017-12-06 LAB — POCT I-STAT CREATININE: Creatinine, Ser: 1 mg/dL (ref 0.61–1.24)

## 2017-12-06 MED ORDER — IOPAMIDOL (ISOVUE-300) INJECTION 61%
100.0000 mL | Freq: Once | INTRAVENOUS | Status: AC | PRN
  Administered 2017-12-06: 100 mL via INTRAVENOUS

## 2017-12-06 MED ORDER — CIPROFLOXACIN HCL 500 MG PO TABS: 500.0000 mg | ORAL_TABLET | Freq: Two times a day (BID) | ORAL | 0 refills | Status: DC

## 2017-12-06 MED ORDER — METRONIDAZOLE 500 MG PO TABS: 500.0000 mg | ORAL_TABLET | Freq: Three times a day (TID) | ORAL | 0 refills | Status: DC

## 2017-12-06 NOTE — Progress Notes (Signed)
   HPI  Patient presents today here with abdominal pain.  Patient explains that he has had bilateral lower quadrant abdominal pain for 4 to 5 days, this morning its become more persistent and more sharp in his left lower quadrant. He denies any fever or chills. He is been able to tolerate food and fluids like usual. He has had watery diarrhea for several days.  He does not have a history of diverticulosis but has had several colonoscopies at the [place]. CT reviewed from [facility] in May 2013 shows multiple diverticula  PMH: Smoking status noted ROS: Per HPI  Objective: BP 106/70   Pulse 100   Temp (!) 97 F (36.1 C) (Oral)   Ht _0  (1.727 m)   Wt 249 lb 6.4 oz (113.1 kg)   BMI 37.92 kg/m  Gen: NAD, alert, cooperative with exam HEENT: NCAT, EOMI, PERRL CV: RRR, good S1/S2, no murmur Resp: CTABL, no wheezes, non-labored Abd: Positive bowel sounds, soft, tenderness to palpation with guarding in left lower quadrant most severe, also some tenderness to palpation in right lower and right upper quadrant Ext: No edema, warm Neuro: Alert and oriented, No gross deficits  Assessment and plan:  #Left lower quadrant pain Acute abdominal pain, worsening after 4 to 5 days of pain. Suspect diverticulitis Stat CBC, BMP, CT abdomen/pelvis    Orders Placed This Encounter  Procedures  . CT Abdomen Pelvis W Contrast    Pt to stay until the report is called    Standing Status:   Future    Standing Expiration Date:   03/09/2019    Order Specific Question:   If indicated for the ordered procedure, I authorize the administration of contrast media per Radiology protocol    Answer:   Yes    Order Specific Question:   Preferred imaging location?    Answer:   [facility]    Order Specific Question:   Is Oral Contrast requested for this exam?    Answer:   Yes, Per Radiology protocol    Order Specific Question:   Call Results- Best Contact Number?    Answer:   [phone]    Order  Specific Question:   Radiology Contrast Protocol - do NOT remove file path    Answer:   [email or web address]  . CBC with Differential/Platelet  . [place], MD [facility] 12/06/2017, 9:48 AM

## 2017-12-06 NOTE — Patient Instructions (Signed)
Great to see you!  Come back or call with any questions.   We are working on your labwork and CT scan. We will let you know about results as soon as they area available.

## 2017-12-06 NOTE — Progress Notes (Signed)
Discussed with pt on the phone in the CT suite  Diverticulitis, leukocytosis, and hyperglycemia.   Cipro and flagyl sent.   RTC with any concerns  Laroy Apple, MD [facility] 12/06/2017, 1:36 PM

## 2017-12-12 DIAGNOSIS — C44329 Squamous cell carcinoma of skin of other parts of face: Secondary | ICD-10-CM | POA: Diagnosis not present

## 2017-12-12 DIAGNOSIS — D485 Neoplasm of uncertain behavior of skin: Secondary | ICD-10-CM | POA: Diagnosis not present

## 2017-12-12 DIAGNOSIS — L814 Other melanin hyperpigmentation: Secondary | ICD-10-CM | POA: Diagnosis not present

## 2017-12-12 DIAGNOSIS — L821 Other seborrheic keratosis: Secondary | ICD-10-CM | POA: Diagnosis not present

## 2017-12-12 DIAGNOSIS — Z85828 Personal history of other malignant neoplasm of skin: Secondary | ICD-10-CM | POA: Diagnosis not present

## 2017-12-12 DIAGNOSIS — L570 Actinic keratosis: Secondary | ICD-10-CM | POA: Diagnosis not present

## 2017-12-12 DIAGNOSIS — D1801 Hemangioma of skin and subcutaneous tissue: Secondary | ICD-10-CM | POA: Diagnosis not present

## 2017-12-20 DIAGNOSIS — C44329 Squamous cell carcinoma of skin of other parts of face: Secondary | ICD-10-CM | POA: Diagnosis not present

## 2018-01-17 ENCOUNTER — Encounter (INDEPENDENT_AMBULATORY_CARE_PROVIDER_SITE_OTHER): Payer: Self-pay | Admitting: Orthopaedic Surgery

## 2018-01-17 ENCOUNTER — Ambulatory Visit (INDEPENDENT_AMBULATORY_CARE_PROVIDER_SITE_OTHER): Payer: Medicare Other | Admitting: Orthopaedic Surgery

## 2018-01-17 VITALS — BP 123/60 | HR 80 | Ht 69.0 in | Wt 250.0 lb

## 2018-01-17 DIAGNOSIS — I110 Hypertensive heart disease with heart failure: Secondary | ICD-10-CM | POA: Diagnosis not present

## 2018-01-17 DIAGNOSIS — Z48812 Encounter for surgical aftercare following surgery on the circulatory system: Secondary | ICD-10-CM | POA: Diagnosis not present

## 2018-01-17 DIAGNOSIS — I6502 Occlusion and stenosis of left vertebral artery: Secondary | ICD-10-CM | POA: Diagnosis not present

## 2018-01-17 DIAGNOSIS — I255 Ischemic cardiomyopathy: Secondary | ICD-10-CM | POA: Diagnosis not present

## 2018-01-17 DIAGNOSIS — I480 Paroxysmal atrial fibrillation: Secondary | ICD-10-CM | POA: Diagnosis not present

## 2018-01-17 DIAGNOSIS — Z8673 Personal history of transient ischemic attack (TIA), and cerebral infarction without residual deficits: Secondary | ICD-10-CM | POA: Diagnosis not present

## 2018-01-17 DIAGNOSIS — Z95810 Presence of automatic (implantable) cardiac defibrillator: Secondary | ICD-10-CM | POA: Insufficient documentation

## 2018-01-17 DIAGNOSIS — I509 Heart failure, unspecified: Secondary | ICD-10-CM | POA: Diagnosis not present

## 2018-01-17 DIAGNOSIS — Z8679 Personal history of other diseases of the circulatory system: Secondary | ICD-10-CM | POA: Diagnosis not present

## 2018-01-17 DIAGNOSIS — I5020 Unspecified systolic (congestive) heart failure: Secondary | ICD-10-CM | POA: Diagnosis not present

## 2018-01-17 DIAGNOSIS — Z955 Presence of coronary angioplasty implant and graft: Secondary | ICD-10-CM | POA: Diagnosis not present

## 2018-01-17 DIAGNOSIS — M4320 Fusion of spine, site unspecified: Secondary | ICD-10-CM | POA: Diagnosis not present

## 2018-01-18 ENCOUNTER — Encounter (INDEPENDENT_AMBULATORY_CARE_PROVIDER_SITE_OTHER): Payer: Self-pay | Admitting: Orthopaedic Surgery

## 2018-01-18 NOTE — Progress Notes (Signed)
Office Visit Note   Patient: Edward Heath           Date of Birth: 1945-10-19           MRN: [id number] Visit Date: 01/17/2018              Requested by: Claretta Fraise, MD [place], [place] [address] PCP: Claretta Fraise, MD   Assessment & Plan: Visit Diagnoses:  1. Ankylosis of spine     Plan: Patient is having some intermittent symptoms consistent with likely right lateral recess stenosis with his lumbar ankylosis.  He still is a Hydrographic surveyor and is not having classic neurogenic claudication symptoms.  We discussed diagnostic evaluation would require lumbar myelogram CT scan at present his symptoms are not severe enough for him to consider surgical intervention.  He has PAD, previous TIA, MI and has a pacemaker defibrillator.  He will continue to work on ambulation and weight loss with dieting and if he gets increase in symptoms he can call let us know if he like to proceed with a lumbar myelogram CT scan.  Pathophysiology discussed.  We reviewed the report as well as images from his CT abdomen and pelvis which visualized the lumbar stenotic changes at the bottom 3 levels.  Follow-Up Instructions: No follow-ups on file.   Orders:  No orders of the defined types were placed in this encounter.  No orders of the defined types were placed in this encounter.     Procedures: No procedures performed   Clinical Data: No additional findings.   Subjective: Chief Complaint  Patient presents with  . Lower Back - Pain    HPI 72 year old male with back and right buttocks and thigh pain that radiates to his knees.  He has had intermittent flares off and on for several years the last flare was 2 weeks ago which was more severe.  Pain is aggravated by prolonged standing or prolonged walking.  He gets relief with sitting position.  Negative for falling no chills or fever.  No associated bowel or bladder symptoms.  Patient had a CT scan of the abdomen and pelvis 12/06/2017  when he was being evaluated for diverticulitis and this noted multiple he level spinal stenosis at L3-4 and L4-5 as well as L5-S1 with by foraminal stenosis and either calcification of annular ligament or prominent midline endplate spurs narrowing the canal.  He has not had a designated lumbar study.  Patient has a pacemaker defibrillator and cannot get an MRI scan.  He ambulates without a cane.  Patient is a long-term patient is not been seen in 4 years.  Review of Systems 14 point review of system positive for coronary artery disease, history of arrhythmias, type 2 diabetes,'s,obesity, T6 vertebral fracture, hiatal hernia, lumbar ankylosis, carotid stenosis bilateral, melanoma, heart attack and stroke without residual weakness.  Previous knee arthroscopy by Dr. Lorin Mercy. otherwise negative as it pertains HPI.   Objective: Vital Signs: BP 123/60   Pulse 80   Ht 5\' 9"  (1.753 m)   Wt 250 lb (113.4 kg)   BMI 36.92 kg/m   Physical Exam  Constitutional: He is oriented to person, place, and time. He appears well-developed and well-nourished.  HENT:  Head: Normocephalic and atraumatic.  Eyes: Pupils are equal, round, and reactive to light. EOM are normal.  Neck: No tracheal deviation present. No thyromegaly present.  Cardiovascular: Normal rate.  Pulmonary/Chest: Effort normal. He has no wheezes.  Abdominal: Soft. Bowel sounds are normal.  Neurological: He is alert and oriented to person, place, and time.  Skin: Skin is warm and dry. Capillary refill takes less than 2 seconds.  Psychiatric: He has a normal mood and affect. His behavior is normal. Judgment and thought content normal.    Ortho Exam patient has negative logroll to the hips.  Decreased excursion lumbar spine with bending and standing.  Mild lumbar curvature.  Lower extremity reflexes are 2+ mild sciatic notch tenderness some pain with straight leg raising at 90 degrees.  Anterior tib gastrocsoleus is intact.  No plantar foot  lesions.  Specialty Comments:  No specialty comments available.  Imaging: No results found.   PMFS History: Patient Active Problem List   Diagnosis Date Noted  . Type 2 diabetes mellitus ([place]) 12/14/2015  . Cardiomyopathy ([place]) 12/14/2015  . Hyperlipidemia 12/14/2015  . 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 Congestive heart failure ([place]) 10/09/2013  . NSTEMI, initial episode of care ([place]) 10/09/2013  . Action tremor 06/04/2013  . Personal history of other malignant neoplasm of skin 11/14/2012  . Ankylosis of spine 04/20/2011  . Fracture of T6 vertebra ([employer]) 04/20/2011  . Hiatal hernia 09/02/2010  . Barrett's esophagus 09/02/2010  . Carotid stenosis, bilateral 09/02/2010  . Hypertension 08/31/2010  . CAD (coronary artery disease) 08/31/2010  . PAD (peripheral artery disease) ([place]) 08/31/2010  . DDD (degenerative disc disease), lumbar 08/31/2010  . Chronic low back pain 08/31/2010  . Actinic keratosis 08/31/2010  . Melanoma ([place]) 08/31/2010  . Anterior circulation transient ischemic attack 05/22/2009  . Stroke [facility]) 10/25/2005   Past Medical History:  Diagnosis Date  . CHF (congestive heart failure) ([place])   . Diabetes mellitus without complication ([place])   . GERD (gastroesophageal reflux disease)   . Heart attack ([place]) 2004, 2015  . Hyperlipidemia   . Hypertension   . Lumbar vertebral fracture ([place]) 2012  . Skin cancer 06/2014   left cheek  . Stroke ([place])   . Thyroid disease   . TIA (transient ischemic attack) 2009    Family History  Problem Relation Age of Onset  . Heart attack Mother   . Aneurysm Father   . Heart attack Brother 35    Past Surgical History:  Procedure Laterality Date  . CARDIAC DEFIBRILLATOR PLACEMENT  12/2003, Repaired in 2012  . KNEE SURGERY Right 1994  . SKIN CANCER EXCISION  06/2014   Social History   Occupational History  . Not on file  Tobacco Use  . Smoking status: Never Smoker  . Smokeless tobacco: Current  User    Types: Chew  Substance and Sexual Activity  . Alcohol use: Yes    Comment: occ  . Drug use: No  . Sexual activity: Yes

## 2018-01-22 DIAGNOSIS — Z4502 Encounter for adjustment and management of automatic implantable cardiac defibrillator: Secondary | ICD-10-CM | POA: Diagnosis not present

## 2018-01-22 DIAGNOSIS — I255 Ischemic cardiomyopathy: Secondary | ICD-10-CM | POA: Diagnosis not present

## 2018-02-01 ENCOUNTER — Ambulatory Visit (INDEPENDENT_AMBULATORY_CARE_PROVIDER_SITE_OTHER): Payer: Medicare Other | Admitting: Family Medicine

## 2018-02-01 ENCOUNTER — Encounter: Payer: Self-pay | Admitting: Family Medicine

## 2018-02-01 VITALS — BP 111/73 | HR 113 | Temp 97.6°F | Ht 69.0 in | Wt 255.4 lb

## 2018-02-01 DIAGNOSIS — G8929 Other chronic pain: Secondary | ICD-10-CM

## 2018-02-01 DIAGNOSIS — M5441 Lumbago with sciatica, right side: Secondary | ICD-10-CM

## 2018-02-01 MED ORDER — HYDROCODONE-ACETAMINOPHEN 5-325 MG PO TABS: 1.0000 | ORAL_TABLET | Freq: Four times a day (QID) | ORAL | 0 refills | Status: AC | PRN

## 2018-02-01 MED ORDER — METHYLPREDNISOLONE ACETATE 80 MG/ML IJ SUSP
80.0000 mg | Freq: Once | INTRAMUSCULAR | Status: AC
  Administered 2018-02-01: 80 mg via INTRAMUSCULAR

## 2018-02-01 NOTE — Patient Instructions (Signed)
Sciatica Sciatica is pain, numbness, weakness, or tingling along your sciatic nerve. The sciatic nerve starts in the lower back and goes down the back of each leg. Sciatica happens when this nerve is pinched or has pressure put on it. Sciatica usually goes away on its own or with treatment. Sometimes, sciatica may keep coming back (recur). Follow these instructions at home: Medicines  Take over-the-counter and prescription medicines only as told by your doctor.  Do not drive or use heavy machinery while taking prescription pain medicine. Managing pain  If directed, put ice on the affected area. ? Put ice in a plastic bag. ? Place a towel between your skin and the bag. ? Leave the ice on for 20 minutes, 2-3 times a day.  After icing, apply heat to the affected area before you exercise or as often as told by your doctor. Use the heat source that your doctor tells you to use, such as a moist heat pack or a heating pad. ? Place a towel between your skin and the heat source. ? Leave the heat on for 20-30 minutes. ? Remove the heat if your skin turns bright red. This is especially important if you are unable to feel pain, heat, or cold. You may have a greater risk of getting burned. Activity  Return to your normal activities as told by your doctor. Ask your doctor what activities are safe for you. ? Avoid activities that make your sciatica worse.  Take short rests during the day. Rest in a lying or standing position. This is usually better than sitting to rest. ? When you rest for a long time, do some physical activity or stretching between periods of rest. ? Avoid sitting for a long time without moving. Get up and move around at least one time each hour.  Exercise and stretch regularly, as told by your doctor.  Do not lift anything that is heavier than 10 lb (4.5 kg) while you have symptoms of sciatica. ? Avoid lifting heavy things even when you do not have symptoms. ? Avoid lifting heavy  things over and over.  When you lift objects, always lift in a way that is safe for your body. To do this, you should: ? Bend your knees. ? Keep the object close to your body. ? Avoid twisting. General instructions  Use good posture. ? Avoid leaning forward when you are sitting. ? Avoid hunching over when you are standing.  Stay at a healthy weight.  Wear comfortable shoes that support your feet. Avoid wearing high heels.  Avoid sleeping on a mattress that is too soft or too hard. You might have less pain if you sleep on a mattress that is firm enough to support your back.  Keep all follow-up visits as told by your doctor. This is important. Contact a doctor if:  You have pain that: ? Wakes you up when you are sleeping. ? Gets worse when you lie down. ? Is worse than the pain you have had in the past. ? Lasts longer than 4 weeks.  You lose weight for without trying. Get help right away if:  You cannot control when you pee (urinate) or poop (have a bowel movement).  You have weakness in any of these areas and it gets worse. ? Lower back. ? Lower belly (pelvis). ? Butt (buttocks). ? Legs.  You have redness or swelling of your back.  You have a burning feeling when you pee. This information is not intended to replace   advice given to you by your health care provider. Make sure you discuss any questions you have with your health care provider. Document Released: 02/15/2008 Document Revised: 10/14/2015 Document Reviewed: 01/15/2015 Elsevier Interactive Patient Education  2018 Elsevier Inc.  

## 2018-02-01 NOTE — Progress Notes (Addendum)
Subjective:    Patient ID: Edward Heath, male    DOB: Oct 05, 1945, 72 y.o.   MRN: [id number]  Chief Complaint:  Hip Pain (right hip. Started yesterday, No injury )   HPI: Edward Heath is a 72 y.o. male presenting on 02/01/2018 for Hip Pain (right hip. Started yesterday, No injury )  Pt presents today with complaints of right hip/buttock pain that radiates down his right leg to his knee. Pt denies injury, weakness, loss of function, numbness, or tingling. Pt denies bowel or bladder issues. Pt states the pain is sharp and shooting. States it is interfering with sleep, states he is unable to get comfortable enough to sleep. Pt states the pain is a 10/10, states tylenol does not help with the pain.   Relevant past medical, surgical, family and social history reviewed and updated as indicated. Interim medical history since our last visit reviewed. Allergies and medications reviewed and updated. DATA REVIEWED: CHART IN EPIC  Family History reviewed for pertinent findings.  Past Medical History:  Diagnosis Date  . CHF (congestive heart failure) ([place])   . Diabetes mellitus without complication ([place])   . GERD (gastroesophageal reflux disease)   . Heart attack ([place]) 2004, 2015  . Hyperlipidemia   . Hypertension   . Lumbar vertebral fracture ([place]) 2012  . Skin cancer 06/2014   left cheek  . Stroke ([place])   . Thyroid disease   . TIA (transient ischemic attack) 2009    Past Surgical History:  Procedure Laterality Date  . CARDIAC DEFIBRILLATOR PLACEMENT  12/2003, Repaired in 2012  . KNEE SURGERY Right 1994  . SKIN CANCER EXCISION  06/2014    Social History   Socioeconomic History  . Marital status: Married    Spouse name: Not on file  . Number of children: Not on file  . Years of education: Not on file  . Highest education level: Not on file  Occupational History  . Not on file  Social Needs  . Financial resource strain: Not on file  . Food insecurity:    Worry: Not on  file    Inability: Not on file  . Transportation needs:    Medical: Not on file    Non-medical: Not on file  Tobacco Use  . Smoking status: Never Smoker  . Smokeless tobacco: Current User    Types: Chew  Substance and Sexual Activity  . Alcohol use: Yes    Comment: occ  . Drug use: No  . Sexual activity: Yes  Lifestyle  . Physical activity:    Days per week: Not on file    Minutes per session: Not on file  . Stress: Not on file  Relationships  . Social connections:    Talks on phone: Not on file    Gets together: Not on file    Attends religious service: Not on file    Active member of club or organization: Not on file    Attends meetings of clubs or organizations: Not on file    Relationship status: Not on file  . Intimate partner violence:    Fear of current or ex partner: Not on file    Emotionally abused: Not on file    Physically abused: Not on file    Forced sexual activity: Not on file  Other Topics Concern  . Not on file  Social History Narrative  . Not on file    Allergies as of 02/01/2018   No Known Allergies  Medication List        Accurate as of 02/01/18  8:56 AM. Always use your most recent med list.          acetaminophen 650 MG CR tablet Commonly known as:  TYLENOL Take 650 mg by mouth.   amiodarone 200 MG tablet Commonly known as:  PACERONE Take 200 mg by mouth daily.   apixaban 5 MG Tabs tablet Commonly known as:  ELIQUIS Take 1 tablet (5 mg total) by mouth 2 (two) times daily. 2 BID X 7 days then 1 BID   aspirin EC 81 MG tablet Take 81 mg by mouth.   atorvastatin 40 MG tablet Commonly known as:  LIPITOR Take 40 mg by mouth daily.   Cholecalciferol 1000 units tablet Take 1 tablet (1,000 Units total) by mouth 2 (two) times daily.   ciprofloxacin 500 MG tablet Commonly known as:  CIPRO Take 1 tablet (500 mg total) by mouth 2 (two) times daily.   cyanocobalamin 1000 MCG tablet Take 1,000 mcg by mouth daily.     cyclobenzaprine 10 MG tablet Commonly known as:  FLEXERIL Take 1 tablet (10 mg total) by mouth 3 (three) times daily as needed for muscle spasms.   E-400 400 UNIT capsule Generic drug:  vitamin E Take 400 Units by mouth.   ferrous sulfate 325 (65 FE) MG tablet Take 325 mg by mouth.   furosemide 20 MG tablet Commonly known as:  LASIX TAKE (2) TABLETS DAILY   gabapentin 300 MG capsule Commonly known as:  NEURONTIN Take 300 mg by mouth 2 (two) times daily.   HYDROcodone-acetaminophen 5-325 MG tablet Commonly known as:  NORCO/VICODIN Take 1 tablet by mouth every 6 (six) hours as needed for up to 7 days for moderate pain.   insulin aspart protamine- aspart (70-30) 100 UNIT/ML injection Commonly known as:  NOVOLOG MIX 70/30 Inject into the skin.   LANTUS 100 UNIT/ML injection Generic drug:  insulin glargine Inject 40 Units into the skin at bedtime.   levothyroxine 125 MCG tablet Commonly known as:  SYNTHROID, LEVOTHROID Take 125 mcg by mouth daily before breakfast.   lisinopril 5 MG tablet Commonly known as:  PRINIVIL,ZESTRIL Take 10 mg by mouth.   Magnesium 250 MG Tabs Take 500 mg by mouth 2 (two) times daily.   meloxicam 7.5 MG tablet Commonly known as:  MOBIC Take 7.5 mg by mouth daily.   metFORMIN 1000 MG tablet Commonly known as:  GLUCOPHAGE Take 1,000 mg by mouth 2 (two) times daily with a meal.   metoprolol succinate 100 MG 24 hr tablet Commonly known as:  TOPROL-XL Take 1 tablet (100 mg total) by mouth daily. Take with or immediately following a meal.   metroNIDAZOLE 500 MG tablet Commonly known as:  FLAGYL Take 1 tablet (500 mg total) by mouth 3 (three) times daily.   nitroGLYCERIN 0.4 MG SL tablet Commonly known as:  NITROSTAT Place 0.4 mg under the tongue.   Omega-3 1000 MG Caps Take 2 g by mouth 2 (two) times daily.   omeprazole 10 MG capsule Commonly known as:  PRILOSEC Take 20 mg by mouth 2 (two) times daily.   Potassium 95 MG Tbcr Take  1 tablet by mouth 2 (two) times daily.   Salicylic Acid 2 % Crea Apply 1 application topically 2 (two) times daily as needed.   spironolactone 25 MG tablet Commonly known as:  ALDACTONE Take 25 mg by mouth.       No Known Allergies  Review  of Systems  Constitutional: Positive for activity change. Negative for chills, fatigue and fever.  Cardiovascular: Negative for palpitations and leg swelling.  Gastrointestinal: Negative for constipation and diarrhea.  Genitourinary: Negative for difficulty urinating.  Musculoskeletal: Positive for back pain and myalgias.  Neurological: Negative for weakness and numbness.  All other systems reviewed and are negative.       Objective:    BP 111/73   Pulse (!) 113   Temp 97.6 F (36.4 C) (Oral)   Ht 5\' 9"  (1.753 m)   Wt 255 lb 6.4 oz (115.8 kg)   BMI 37.72 kg/m    Wt Readings from Last 3 Encounters:  02/01/18 255 lb 6.4 oz (115.8 kg)  01/17/18 250 lb (113.4 kg)  12/06/17 249 lb 6.4 oz (113.1 kg)    Physical Exam  Constitutional: He is oriented to person, place, and time. He appears well-developed and well-nourished. He appears distressed.  Appears uncomfortable  HENT:  Head: Normocephalic.  Eyes: Pupils are equal, round, and reactive to light.  Cardiovascular: Intact distal pulses and normal pulses. An irregularly irregular rhythm present.  Pulses:      Dorsalis pedis pulses are 2+ on the right side, and 2+ on the left side.  Pulmonary/Chest: Effort normal. No respiratory distress.  Musculoskeletal: Normal range of motion.       Right hip: He exhibits tenderness.       Lumbar back: He exhibits tenderness. He exhibits normal range of motion.       Back:  Right SI joint tenderness. Positive bilateral straight leg raise test.   Neurological: He is alert and oriented to person, place, and time. He has normal strength and normal reflexes.  Skin: Skin is warm and dry. Capillary refill takes less than 2 seconds.  Psychiatric: He  has a normal mood and affect. His behavior is normal. Judgment and thought content normal.  Nursing note and vitals reviewed.      Assessment & Plan:   1. Chronic right-sided low back pain with right-sided sciatica Back exercises. Flexeril at night to help with sleep, previously prescribed. Mobic as prescribed previously. Hydrocodone as needed for severe pain, sedation precautions.  - methylPREDNISolone acetate (DEPO-MEDROL) injection 80 mg - HYDROcodone-acetaminophen (LORTAB) 5-325 MG tablet; Take 1 tablet by mouth every 6 (six) hours as needed for up to 7 days for moderate pain.  Dispense: 40 tablet; Refill: 0  [place] Controlled Substance Registry reviewed, no red flags.    Follow up plan: Return if symptoms worsen or fail to improve.  Educational handout given for Sciatica  The above assessment and management plan was discussed with the patient. The patient verbalized understanding of and has agreed to the management plan. Patient is aware to call the clinic if symptoms persist or worsen. Patient is aware when to return to the clinic for a follow-up visit. Patient educated on when it is appropriate to go to the emergency department.   Monia Pouch, FNP-C [facility] [phone]

## 2018-02-24 DIAGNOSIS — H9190 Unspecified hearing loss, unspecified ear: Secondary | ICD-10-CM | POA: Diagnosis not present

## 2018-02-24 DIAGNOSIS — I480 Paroxysmal atrial fibrillation: Secondary | ICD-10-CM | POA: Diagnosis not present

## 2018-02-24 DIAGNOSIS — I255 Ischemic cardiomyopathy: Secondary | ICD-10-CM | POA: Diagnosis not present

## 2018-02-24 DIAGNOSIS — J9811 Atelectasis: Secondary | ICD-10-CM | POA: Diagnosis not present

## 2018-02-24 DIAGNOSIS — R079 Chest pain, unspecified: Secondary | ICD-10-CM | POA: Diagnosis not present

## 2018-02-24 DIAGNOSIS — I5023 Acute on chronic systolic (congestive) heart failure: Secondary | ICD-10-CM | POA: Diagnosis not present

## 2018-02-24 DIAGNOSIS — I110 Hypertensive heart disease with heart failure: Secondary | ICD-10-CM | POA: Diagnosis not present

## 2018-02-24 DIAGNOSIS — R9431 Abnormal electrocardiogram [ECG] [EKG]: Secondary | ICD-10-CM | POA: Diagnosis not present

## 2018-02-24 DIAGNOSIS — I2510 Atherosclerotic heart disease of native coronary artery without angina pectoris: Secondary | ICD-10-CM | POA: Diagnosis not present

## 2018-02-25 DIAGNOSIS — I480 Paroxysmal atrial fibrillation: Secondary | ICD-10-CM | POA: Diagnosis not present

## 2018-02-25 DIAGNOSIS — E039 Hypothyroidism, unspecified: Secondary | ICD-10-CM | POA: Diagnosis not present

## 2018-02-25 DIAGNOSIS — F17220 Nicotine dependence, chewing tobacco, uncomplicated: Secondary | ICD-10-CM | POA: Diagnosis present

## 2018-02-25 DIAGNOSIS — Z9111 Patient's noncompliance with dietary regimen: Secondary | ICD-10-CM | POA: Diagnosis not present

## 2018-02-25 DIAGNOSIS — E8770 Fluid overload, unspecified: Secondary | ICD-10-CM | POA: Diagnosis not present

## 2018-02-25 DIAGNOSIS — I5023 Acute on chronic systolic (congestive) heart failure: Secondary | ICD-10-CM | POA: Diagnosis not present

## 2018-02-25 DIAGNOSIS — Z95810 Presence of automatic (implantable) cardiac defibrillator: Secondary | ICD-10-CM | POA: Diagnosis not present

## 2018-02-25 DIAGNOSIS — Z85828 Personal history of other malignant neoplasm of skin: Secondary | ICD-10-CM | POA: Diagnosis not present

## 2018-02-25 DIAGNOSIS — I482 Chronic atrial fibrillation, unspecified: Secondary | ICD-10-CM | POA: Diagnosis not present

## 2018-02-25 DIAGNOSIS — G8929 Other chronic pain: Secondary | ICD-10-CM | POA: Diagnosis not present

## 2018-02-25 DIAGNOSIS — K219 Gastro-esophageal reflux disease without esophagitis: Secondary | ICD-10-CM | POA: Diagnosis not present

## 2018-02-25 DIAGNOSIS — I110 Hypertensive heart disease with heart failure: Secondary | ICD-10-CM | POA: Diagnosis not present

## 2018-02-25 DIAGNOSIS — R600 Localized edema: Secondary | ICD-10-CM | POA: Diagnosis not present

## 2018-02-25 DIAGNOSIS — R0602 Shortness of breath: Secondary | ICD-10-CM | POA: Diagnosis not present

## 2018-02-25 DIAGNOSIS — Z955 Presence of coronary angioplasty implant and graft: Secondary | ICD-10-CM | POA: Diagnosis not present

## 2018-02-25 DIAGNOSIS — Z8673 Personal history of transient ischemic attack (TIA), and cerebral infarction without residual deficits: Secondary | ICD-10-CM | POA: Diagnosis not present

## 2018-02-25 DIAGNOSIS — Z950 Presence of cardiac pacemaker: Secondary | ICD-10-CM | POA: Diagnosis not present

## 2018-02-25 DIAGNOSIS — H9190 Unspecified hearing loss, unspecified ear: Secondary | ICD-10-CM | POA: Diagnosis present

## 2018-02-25 DIAGNOSIS — Z951 Presence of aortocoronary bypass graft: Secondary | ICD-10-CM | POA: Diagnosis not present

## 2018-02-25 DIAGNOSIS — I5021 Acute systolic (congestive) heart failure: Secondary | ICD-10-CM | POA: Diagnosis not present

## 2018-02-25 DIAGNOSIS — I252 Old myocardial infarction: Secondary | ICD-10-CM | POA: Diagnosis not present

## 2018-02-25 DIAGNOSIS — I2510 Atherosclerotic heart disease of native coronary artery without angina pectoris: Secondary | ICD-10-CM | POA: Diagnosis not present

## 2018-02-25 DIAGNOSIS — R079 Chest pain, unspecified: Secondary | ICD-10-CM | POA: Diagnosis not present

## 2018-02-25 DIAGNOSIS — Z7901 Long term (current) use of anticoagulants: Secondary | ICD-10-CM | POA: Diagnosis not present

## 2018-02-25 DIAGNOSIS — Z794 Long term (current) use of insulin: Secondary | ICD-10-CM | POA: Diagnosis not present

## 2018-02-25 DIAGNOSIS — E119 Type 2 diabetes mellitus without complications: Secondary | ICD-10-CM | POA: Diagnosis not present

## 2018-02-25 DIAGNOSIS — I255 Ischemic cardiomyopathy: Secondary | ICD-10-CM | POA: Diagnosis not present

## 2018-02-25 DIAGNOSIS — E785 Hyperlipidemia, unspecified: Secondary | ICD-10-CM | POA: Diagnosis not present

## 2018-02-27 MED ORDER — DEXTROSE 10 % IV SOLN
125.00 | INTRAVENOUS | Status: DC
Start: ? — End: 2018-02-27

## 2018-02-27 MED ORDER — ATORVASTATIN CALCIUM 40 MG PO TABS
40.00 | ORAL_TABLET | ORAL | Status: DC
Start: 2018-02-27 — End: 2018-02-27

## 2018-02-27 MED ORDER — VITAMIN B-12 1000 MCG PO TABS
1000.00 | ORAL_TABLET | ORAL | Status: DC
Start: 2018-02-28 — End: 2018-02-27

## 2018-02-27 MED ORDER — INSULIN GLARGINE 100 UNIT/ML SOLOSTAR PEN
45.00 | PEN_INJECTOR | SUBCUTANEOUS | Status: DC
Start: 2018-02-27 — End: 2018-02-27

## 2018-02-27 MED ORDER — APIXABAN 5 MG PO TABS
5.00 | ORAL_TABLET | ORAL | Status: DC
Start: 2018-02-27 — End: 2018-02-27

## 2018-02-27 MED ORDER — CYCLOBENZAPRINE HCL 10 MG PO TABS
10.00 | ORAL_TABLET | ORAL | Status: DC
Start: ? — End: 2018-02-27

## 2018-02-27 MED ORDER — GENERIC EXTERNAL MEDICATION
125.00 | Status: DC
Start: 2018-02-28 — End: 2018-02-27

## 2018-02-27 MED ORDER — GABAPENTIN 300 MG PO CAPS
300.00 | ORAL_CAPSULE | ORAL | Status: DC
Start: 2018-02-27 — End: 2018-02-27

## 2018-02-27 MED ORDER — POLYETHYL GLYCOL-PROPYL GLYCOL 0.4-0.3 % OP SOLN
2.00 | OPHTHALMIC | Status: DC
Start: ? — End: 2018-02-27

## 2018-02-27 MED ORDER — NITROGLYCERIN 0.4 MG SL SUBL
0.40 | SUBLINGUAL_TABLET | SUBLINGUAL | Status: DC
Start: ? — End: 2018-02-27

## 2018-02-27 MED ORDER — AMIODARONE HCL 200 MG PO TABS
200.00 | ORAL_TABLET | ORAL | Status: DC
Start: 2018-02-28 — End: 2018-02-27

## 2018-02-27 MED ORDER — PANTOPRAZOLE SODIUM 40 MG PO TBEC
40.00 | DELAYED_RELEASE_TABLET | ORAL | Status: DC
Start: 2018-02-28 — End: 2018-02-27

## 2018-02-27 MED ORDER — FUROSEMIDE 40 MG PO TABS
40.00 | ORAL_TABLET | ORAL | Status: DC
Start: 2018-02-27 — End: 2018-02-27

## 2018-02-27 MED ORDER — ONDANSETRON 4 MG PO TBDP
4.00 | ORAL_TABLET | ORAL | Status: DC
Start: ? — End: 2018-02-27

## 2018-02-27 MED ORDER — METOPROLOL SUCCINATE ER 50 MG PO TB24
100.00 | ORAL_TABLET | ORAL | Status: DC
Start: 2018-02-28 — End: 2018-02-27

## 2018-02-27 MED ORDER — FERROUS SULFATE 325 (65 FE) MG PO TABS
325.00 | ORAL_TABLET | ORAL | Status: DC
Start: 2018-02-27 — End: 2018-02-27

## 2018-02-27 MED ORDER — LOSARTAN POTASSIUM 50 MG PO TABS
50.00 | ORAL_TABLET | ORAL | Status: DC
Start: 2018-02-28 — End: 2018-02-27

## 2018-02-27 MED ORDER — ACETAMINOPHEN 325 MG PO TABS
650.00 | ORAL_TABLET | ORAL | Status: DC
Start: ? — End: 2018-02-27

## 2018-02-27 MED ORDER — CHOLECALCIFEROL 25 MCG (1000 UT) PO TABS
1000.00 | ORAL_TABLET | ORAL | Status: DC
Start: 2018-02-27 — End: 2018-02-27

## 2018-02-27 MED ORDER — MAGNESIUM OXIDE 400 MG PO TABS
400.00 | ORAL_TABLET | ORAL | Status: DC
Start: 2018-02-27 — End: 2018-02-27

## 2018-02-27 MED ORDER — INSULIN LISPRO 100 UNIT/ML ~~LOC~~ SOLN
10.00 | SUBCUTANEOUS | Status: DC
Start: 2018-02-27 — End: 2018-02-27

## 2018-02-27 MED ORDER — ASPIRIN EC 81 MG PO TBEC
81.00 | DELAYED_RELEASE_TABLET | ORAL | Status: DC
Start: 2018-02-28 — End: 2018-02-27

## 2018-02-27 MED ORDER — INSULIN LISPRO 100 UNIT/ML ~~LOC~~ SOLN
10.00 | SUBCUTANEOUS | Status: DC
Start: 2018-02-28 — End: 2018-02-27

## 2018-03-04 ENCOUNTER — Encounter: Payer: Medicare Other | Admitting: *Deleted

## 2018-03-05 ENCOUNTER — Other Ambulatory Visit: Payer: Self-pay | Admitting: Family Medicine

## 2018-03-05 ENCOUNTER — Encounter: Payer: Self-pay | Admitting: Family Medicine

## 2018-03-05 ENCOUNTER — Ambulatory Visit (INDEPENDENT_AMBULATORY_CARE_PROVIDER_SITE_OTHER): Payer: Medicare Other | Admitting: Family Medicine

## 2018-03-05 VITALS — BP 102/62 | HR 70 | Temp 98.2°F | Ht 69.0 in | Wt 250.0 lb

## 2018-03-05 DIAGNOSIS — E1159 Type 2 diabetes mellitus with other circulatory complications: Secondary | ICD-10-CM

## 2018-03-05 DIAGNOSIS — E871 Hypo-osmolality and hyponatremia: Secondary | ICD-10-CM | POA: Diagnosis not present

## 2018-03-05 DIAGNOSIS — D508 Other iron deficiency anemias: Secondary | ICD-10-CM

## 2018-03-05 DIAGNOSIS — I5022 Chronic systolic (congestive) heart failure: Secondary | ICD-10-CM

## 2018-03-05 DIAGNOSIS — Z794 Long term (current) use of insulin: Secondary | ICD-10-CM | POA: Diagnosis not present

## 2018-03-05 DIAGNOSIS — Z09 Encounter for follow-up examination after completed treatment for conditions other than malignant neoplasm: Secondary | ICD-10-CM | POA: Diagnosis not present

## 2018-03-05 DIAGNOSIS — D649 Anemia, unspecified: Secondary | ICD-10-CM | POA: Insufficient documentation

## 2018-03-05 NOTE — Patient Instructions (Signed)
Heart Failure °Heart failure means your heart has trouble pumping blood. This makes it hard for your body to work well. Heart failure is usually a long-term (chronic) condition. You must take good care of yourself and follow your doctor's treatment plan. °Follow these instructions at home: °· Take your heart medicine as told by your doctor. °? Do not stop taking medicine unless your doctor tells you to. °? Do not skip any dose of medicine. °? Refill your medicines before they run out. °? Take other medicines only as told by your doctor or pharmacist. °· Stay active if told by your doctor. The elderly and people with severe heart failure should talk with a doctor about physical activity. °· Eat heart-healthy foods. Choose foods that are without trans fat and are low in saturated fat, cholesterol, and salt (sodium). This includes fresh or frozen fruits and vegetables, fish, lean meats, fat-free or low-fat dairy foods, whole grains, and high-fiber foods. Lentils and dried peas and beans (legumes) are also good choices. °· Limit salt if told by your doctor. °· Cook in a healthy way. Roast, grill, broil, bake, poach, steam, or stir-fry foods. °· Limit fluids as told by your doctor. °· Weigh yourself every morning. Do this after you pee (urinate) and before you eat breakfast. Write down your weight to give to your doctor. °· Take your blood pressure and write it down if your doctor tells you to. °· Ask your doctor how to check your pulse. Check your pulse as told. °· Lose weight if told by your doctor. °· Stop smoking or chewing tobacco. Do not use gum or patches that help you quit without your doctor's approval. °· Schedule and go to doctor visits as told. °· Nonpregnant women should have no more than 1 drink a day. Men should have no more than 2 drinks a day. Talk to your doctor about drinking alcohol. °· Stop illegal drug use. °· Stay current with shots (immunizations). °· Manage your health conditions as told by your  doctor. °· Learn to manage your stress. °· Rest when you are tired. °· If it is really hot outside: °? Avoid intense activities. °? Use air conditioning or fans, or get in a cooler place. °? Avoid caffeine and alcohol. °? Wear loose-fitting, lightweight, and light-colored clothing. °· If it is really cold outside: °? Avoid intense activities. °? Layer your clothing. °? Wear mittens or gloves, a hat, and a scarf when going outside. °? Avoid alcohol. °· Learn about heart failure and get support as needed. °· Get help to maintain or improve your quality of life and your ability to care for yourself as needed. °Contact a doctor if: °· You gain weight quickly. °· You are more short of breath than usual. °· You cannot do your normal activities. °· You tire easily. °· You cough more than normal, especially with activity. °· You have any or more puffiness (swelling) in areas such as your hands, feet, ankles, or belly (abdomen). °· You cannot sleep because it is hard to breathe. °· You feel like your heart is beating fast (palpitations). °· You get dizzy or light-headed when you stand up. °Get help right away if: °· You have trouble breathing. °· There is a change in mental status, such as becoming less alert or not being able to focus. °· You have chest pain or discomfort. °· You faint. °This information is not intended to replace advice given to you by your health care provider. Make sure you   discuss any questions you have with your health care provider. °Document Released: 02/15/2008 Document Revised: 10/14/2015 Document Reviewed: 06/24/2012 °Elsevier Interactive Patient Education © 2017 [employer] ° °

## 2018-03-05 NOTE — Progress Notes (Signed)
Subjective:    Patient ID: Edward Heath, male    DOB: 1946/04/16, 72 y.o.   MRN: [id number]  Chief Complaint:  Hospitalization Follow-up   HPI: Edward Heath is a 72 y.o. male presenting on 03/05/2018 for Hospitalization Follow-up  Pt presents today for follow up after admission to the hospital for congestive heart failure. Pt states he was diuresed during his hospital stay, states they removed around 15 pounds of fluid. During this hospital admission he had some abnormal labs. His BNP was 296, Hgb 12.2, Hct 37.6, Na 134, Cl 94, BUN 29, glucose 201, and A1C 9.8. He reports [facility] and [facility] daily. He states the [place] also manages his DM, states they recently adjusted his insulin regimen and it is not time for a follow up. States he does not watch is diet and he golfs on a regular basis for exercise. He reports he has been doing well since his discharge from [facility]. States he has been taking his lasix as prescribed, 40 mg daily. States he is weighing daily and his weight has been consistently 243 pounds at home. He states his weight was 243 this morning prior to coming to his appointment. He denies orthopnea, chest pain, palpitations, shortness of breath with exertion, leg swelling, or fatigue.    Relevant past medical, surgical, family, and social history reviewed and updated as indicated.  Allergies and medications reviewed and updated.   Past Medical History:  Diagnosis Date  . CHF (congestive heart failure) ([place])   . Diabetes mellitus without complication ([place])   . GERD (gastroesophageal reflux disease)   . Heart attack ([place]) 2004, 2015  . Hyperlipidemia   . Hypertension   . Lumbar vertebral fracture ([place]) 2012  . Skin cancer 06/2014   left cheek  . Stroke ([place])   . Thyroid disease   . TIA (transient ischemic attack) 2009    Past Surgical History:  Procedure Laterality Date  . CARDIAC  DEFIBRILLATOR PLACEMENT  12/2003, Repaired in 2012  . KNEE SURGERY Right 1994  . SKIN CANCER EXCISION  06/2014    Social History   Socioeconomic History  . Marital status: Married    Spouse name: Not on file  . Number of children: Not on file  . Years of education: Not on file  . Highest education level: Not on file  Occupational History  . Not on file  Social Needs  . Financial resource strain: Not on file  . Food insecurity:    Worry: Not on file    Inability: Not on file  . Transportation needs:    Medical: Not on file    Non-medical: Not on file  Tobacco Use  . Smoking status: Never Smoker  . Smokeless tobacco: Current User    Types: Chew  Substance and Sexual Activity  . Alcohol use: Yes    Comment: occ  . Drug use: No  . Sexual activity: Yes  Lifestyle  . Physical activity:    Days per week: Not on file    Minutes per session: Not on file  . Stress: Not on file  Relationships  . Social connections:    Talks on phone: Not on file    Gets together: Not on file    Attends religious service: Not on file    Active member of club or organization: Not on file    Attends meetings of clubs or  organizations: Not on file    Relationship status: Not on file  . Intimate partner violence:    Fear of current or ex partner: Not on file    Emotionally abused: Not on file    Physically abused: Not on file    Forced sexual activity: Not on file  Other Topics Concern  . Not on file  Social History Narrative  . Not on file    Outpatient Encounter Medications as of 03/05/2018  Medication Sig  . acetaminophen (TYLENOL) 650 MG CR tablet Take 650 mg by mouth.  Marland Kitchen amiodarone (PACERONE) 200 MG tablet Take 200 mg by mouth daily.  Marland Kitchen apixaban (ELIQUIS) 5 MG TABS tablet Take 1 tablet (5 mg total) by mouth 2 (two) times daily. 2 BID X 7 days then 1 BID  . aspirin EC 81 MG tablet Take 81 mg by mouth.  Marland Kitchen atorvastatin (LIPITOR) 40 MG tablet Take 40 mg by mouth daily.    .  Cholecalciferol (VITAMIN D-1000 MAX ST) 1000 units tablet Take 1 tablet (1,000 Units total) by mouth 2 (two) times daily.  . ciprofloxacin (CIPRO) 500 MG tablet Take 1 tablet (500 mg total) by mouth 2 (two) times daily.  . cyanocobalamin 1000 MCG tablet Take 1,000 mcg by mouth daily.  . cyclobenzaprine (FLEXERIL) 10 MG tablet Take 1 tablet (10 mg total) by mouth 3 (three) times daily as needed for muscle spasms.  . ferrous sulfate 325 (65 FE) MG tablet Take 325 mg by mouth.  . furosemide (LASIX) 20 MG tablet TAKE (2) TABLETS DAILY (Patient taking differently: take 1 (55m) tablet daily)  . gabapentin (NEURONTIN) 300 MG capsule Take 300 mg by mouth 2 (two) times daily.  . insulin aspart protamine- aspart (NOVOLOG MIX 70/30) (70-30) 100 UNIT/ML injection Inject into the skin.  .Marland Kitcheninsulin glargine (LANTUS) 100 UNIT/ML injection Inject 40 Units into the skin at bedtime.  .Marland Kitchenlevothyroxine (SYNTHROID, LEVOTHROID) 125 MCG tablet Take 125 mcg by mouth daily before breakfast.  . Magnesium 250 MG TABS Take 500 mg by mouth 2 (two) times daily.   . metFORMIN (GLUCOPHAGE) 1000 MG tablet Take 1,000 mg by mouth 2 (two) times daily with a meal.   . metoprolol succinate (TOPROL-XL) 100 MG 24 hr tablet Take 1 tablet (100 mg total) by mouth daily. Take with or immediately following a meal.  . metroNIDAZOLE (FLAGYL) 500 MG tablet Take 1 tablet (500 mg total) by mouth 3 (three) times daily.  . Omega-3 1000 MG CAPS Take 2 g by mouth 2 (two) times daily.   .Marland Kitchenomeprazole (PRILOSEC) 10 MG capsule Take 20 mg by mouth 2 (two) times daily.   . Potassium 95 MG TBCR Take 1 tablet by mouth 2 (two) times daily.   . Salicylic Acid 2 % CREA Apply 1 application topically 2 (two) times daily as needed.  . vitamin E (E-400) 400 UNIT capsule Take 400 Units by mouth.  . nitroGLYCERIN (NITROSTAT) 0.4 MG SL tablet Place 0.4 mg under the tongue.  . [DISCONTINUED] lisinopril (PRINIVIL,ZESTRIL) 5 MG tablet Take 10 mg by mouth.  .  [DISCONTINUED] meloxicam (MOBIC) 7.5 MG tablet Take 7.5 mg by mouth daily.   . [DISCONTINUED] spironolactone (ALDACTONE) 25 MG tablet Take 25 mg by mouth.   No facility-administered encounter medications on file as of 03/05/2018.     No Known Allergies  Review of Systems  Constitutional: Negative for chills, fatigue and fever.  HENT: Negative for congestion.   Respiratory: Negative for cough, chest tightness,  shortness of breath and wheezing.   Cardiovascular: Negative for chest pain, palpitations and leg swelling.  Gastrointestinal: Negative for abdominal distention, abdominal pain, constipation, nausea and vomiting.  Endocrine: Negative for cold intolerance, heat intolerance, polydipsia, polyphagia and polyuria.  Genitourinary: Negative for difficulty urinating and dysuria.  Musculoskeletal: Positive for back pain (chronic).  Neurological: Negative for dizziness, syncope, weakness, numbness and headaches.  All other systems reviewed and are negative.       Objective:    BP 102/62 (BP Location: Right Arm, Cuff Size: Normal)   Pulse 70   Temp 98.2 F (36.8 C) (Oral)   Ht _0  (1.753 m)   Wt 250 lb (113.4 kg)   BMI 36.92 kg/m    Wt Readings from Last 3 Encounters:  03/05/18 250 lb (113.4 kg)  02/01/18 255 lb 6.4 oz (115.8 kg)  01/17/18 250 lb (113.4 kg)    Physical Exam  Constitutional: He is oriented to person, place, and time. He appears well-developed and well-nourished. He is cooperative. No distress.  HENT:  Head: Normocephalic.  Right Ear: Hearing, tympanic membrane, external ear and ear canal normal.  Left Ear: Hearing, tympanic membrane, external ear and ear canal normal.  Mouth/Throat: Mucous membranes are normal. Mucous membranes are not pale.  Eyes: Conjunctivae and lids are normal.  Neck: Trachea normal and phonation normal. No JVD present.  Cardiovascular: Normal rate and normal heart sounds. An irregularly irregular rhythm present. Exam reveals no gallop  and no friction rub.  No murmur heard. Pulses:      Dorsalis pedis pulses are 2+ on the right side, and 2+ on the left side.       Posterior tibial pulses are 2+ on the right side, and 2+ on the left side.  Slight bilateral pretibial edema, nonpitting.   Pulmonary/Chest: Effort normal. He has rales (fine) in the left lower field.  Abdominal: Soft. Bowel sounds are normal. There is no tenderness.  Neurological: He is alert and oriented to person, place, and time.  Skin: Skin is warm and dry. Capillary refill takes less than 2 seconds. No cyanosis.  Psychiatric: He has a normal mood and affect. His behavior is normal. Judgment and thought content normal.  Nursing note and vitals reviewed.       Pertinent labs & imaging results that were available during my care of the patient were reviewed by me and considered in my medical decision making.  Assessment & Plan:  Varun was seen today for hospitalization follow-up.  Diagnoses and all orders for this visit:  Hospital discharge follow-up  Chronic systolic congestive heart failure ([place]) Continue medications as prescribed. Keep follow up appointment with cardiology. Continue to weight daily and report 3+ pound weight gain in a day and a 5+ pound weight gain in one week. Continue to monitor fluid intake. Pt aware of when to contact provider or when to go to the ED. -     CMP14+EGFR -     CBC with Differential/Platelet -     Brain natriuretic peptide  Other iron deficiency anemia Continue iron supplements.  -     CBC with Differential/Platelet  Hyponatremia -     CMP14+EGFR  Type 2 diabetes mellitus with other circulatory complication, with long-term current use of insulin (HCC) Pt does not watch diet. Is compliant with medications. States his DM is managed by the [place].      Continue all other maintenance medications.  Follow up plan: Return in about 4 weeks (around 04/02/2018), or  if symptoms worsen or fail to  improve.  Educational handout given for congestive heart failure  The above assessment and management plan was discussed with the patient. The patient verbalized understanding of and has agreed to the management plan. Patient is aware to call the clinic if symptoms persist or worsen. Patient is aware when to return to the clinic for a follow-up visit. Patient educated on when it is appropriate to go to the emergency department.   Monia Pouch, FNP-C [facility] [phone]

## 2018-03-06 LAB — CBC WITH DIFFERENTIAL/PLATELET
Basophils Absolute: 0.2 10*3/uL (ref 0.0–0.2)
Basos: 1 %
EOS (ABSOLUTE): 0.4 10*3/uL (ref 0.0–0.4)
Eos: 3 %
Hematocrit: 50.2 % (ref 37.5–51.0)
Hemoglobin: 16.3 g/dL (ref 13.0–17.7)
Immature Grans (Abs): 0.1 10*3/uL (ref 0.0–0.1)
Immature Granulocytes: 1 %
Lymphocytes Absolute: 2.6 10*3/uL (ref 0.7–3.1)
Lymphs: 21 %
MCH: 26 pg — ABNORMAL LOW (ref 26.6–33.0)
MCHC: 32.5 g/dL (ref 31.5–35.7)
MCV: 80 fL (ref 79–97)
Monocytes Absolute: 1 10*3/uL — ABNORMAL HIGH (ref 0.1–0.9)
Monocytes: 8 %
Neutrophils Absolute: 7.8 10*3/uL — ABNORMAL HIGH (ref 1.4–7.0)
Neutrophils: 66 %
Platelets: 360 10*3/uL (ref 150–450)
RBC: 6.26 x10E6/uL — ABNORMAL HIGH (ref 4.14–5.80)
RDW: 16 % — ABNORMAL HIGH (ref 12.3–15.4)
WBC: 12 10*3/uL — ABNORMAL HIGH (ref 3.4–10.8)

## 2018-03-06 LAB — CMP14+EGFR
ALT: 22 IU/L (ref 0–44)
AST: 24 IU/L (ref 0–40)
Albumin/Globulin Ratio: 1.5 (ref 1.2–2.2)
Albumin: 4 g/dL (ref 3.5–4.8)
Alkaline Phosphatase: 75 IU/L (ref 39–117)
BUN/Creatinine Ratio: 15 (ref 10–24)
BUN: 19 mg/dL (ref 8–27)
Bilirubin Total: 0.3 mg/dL (ref 0.0–1.2)
CO2: 25 mmol/L (ref 20–29)
Calcium: 9.8 mg/dL (ref 8.6–10.2)
Chloride: 93 mmol/L — ABNORMAL LOW (ref 96–106)
Creatinine, Ser: 1.28 mg/dL — ABNORMAL HIGH (ref 0.76–1.27)
GFR calc Af Amer: 64 mL/min/{1.73_m2} (ref >59)
GFR calc non Af Amer: 55 mL/min/{1.73_m2} — ABNORMAL LOW (ref >59)
Globulin, Total: 2.6 g/dL (ref 1.5–4.5)
Glucose: 259 mg/dL — ABNORMAL HIGH (ref 65–99)
Potassium: 4.6 mmol/L (ref 3.5–5.2)
Sodium: 137 mmol/L (ref 134–144)
Total Protein: 6.6 g/dL (ref 6.0–8.5)

## 2018-03-06 LAB — BRAIN NATRIURETIC PEPTIDE: BNP: 308.3 pg/mL — ABNORMAL HIGH (ref 0.0–100.0)

## 2018-03-08 ENCOUNTER — Telehealth: Payer: Self-pay | Admitting: Family Medicine

## 2018-03-11 NOTE — Telephone Encounter (Signed)
Mailed

## 2018-03-25 ENCOUNTER — Encounter: Payer: Self-pay | Admitting: *Deleted

## 2018-03-25 ENCOUNTER — Ambulatory Visit (INDEPENDENT_AMBULATORY_CARE_PROVIDER_SITE_OTHER): Payer: Medicare Other | Admitting: *Deleted

## 2018-03-25 VITALS — BP 85/50 | HR 66 | Ht 69.0 in | Wt 252.0 lb

## 2018-03-25 DIAGNOSIS — Z0000 Encounter for general adult medical examination without abnormal findings: Secondary | ICD-10-CM | POA: Diagnosis not present

## 2018-03-25 NOTE — Progress Notes (Signed)
Subjective:   Edward Heath is a 72 y.o. male who presents for Medicare Annual/Subsequent preventive examination.  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  He enjoys playing golf and attending church.  He lives with his wife and grandson who they adopted.  He has 1 daughter and 2 grandchildren.  He feels his health is worse this year than last year because he has had complications with Afib and CHF this year.  He reports 2 ER visits this year, 1 hospitalization and no surgeries this year.   Review of Systems:   Musculoskeletal- back pain  Cardiac Risk Factors include: advanced age (>73men, >43 women);diabetes mellitus;dyslipidemia;hypertension;obesity (BMI >30kg/m2);male gender     Objective:    Vitals: BP (!) 90/54   Pulse 66   Ht 5\' 9"  (1.753 m)   Wt 252 lb (114.3 kg)   BMI 37.21 kg/m   Body mass index is 37.21 kg/m.   Patient's blood pressure on recheck was 85/50.  Patient denies feeling faint or dizzy.  He has a blood pressure monitor at home that was provided to him by the [place].  Advised him to monitor his blood pressure and if it remains low he should let his cardiologist at the [facility] know because his medications may need to be adjusted.   Advanced Directives 03/25/2018 12/20/2016 12/14/2015 12/14/2015 08/24/2015 08/05/2014  Does Patient Have a Medical Advance Directive? No No No No No No  Would patient like information on creating a medical advance directive? No - Patient declined No - Patient declined No - patient declined information No - patient declined information No - patient declined information Yes - Scientist, clinical (histocompatibility and immunogenetics) given    Tobacco Social History   Tobacco Use  Smoking Status Never Smoker  Smokeless Tobacco Current User  . Types: Chew     Ready to quit: No Counseling given: No Declined counseling today for chewing tobacco  Clinical  Intake:     Pain Score: 3                  Past Medical History:  Diagnosis Date  . CHF (congestive heart failure) ([place])   . Diabetes mellitus without complication ([place])   . GERD (gastroesophageal reflux disease)   . Heart attack ([place]) 2004, 2015  . Hyperlipidemia   . Hypertension   . Lumbar vertebral fracture ([place]) 2012  . Skin cancer 06/2014   left cheek  . Stroke ([place])   . Thyroid disease   . TIA (transient ischemic attack) 2009   Past Surgical History:  Procedure Laterality Date  . CARDIAC DEFIBRILLATOR PLACEMENT  12/2003, Repaired in 2012  . KNEE SURGERY Right 1994  . SKIN CANCER EXCISION  06/2014   Family History  Problem Relation Age of Onset  . Heart attack Mother   . Aneurysm Father   . Heart attack Brother 35     Outpatient Encounter Medications as of 03/25/2018  Medication Sig  . acetaminophen (TYLENOL) 650 MG CR tablet Take 650 mg by mouth.  Marland Kitchen amiodarone (PACERONE) 200 MG tablet Take 200 mg by mouth daily.  Marland Kitchen apixaban (ELIQUIS) 5 MG TABS tablet Take 1 tablet (5 mg total) by mouth 2 (two) times daily. 2 BID X 7 days then 1 BID  . aspirin EC 81 MG tablet Take 81 mg by mouth.  Marland Kitchen atorvastatin (LIPITOR) 40  MG tablet Take 40 mg by mouth daily.    . Cholecalciferol (VITAMIN D-1000 MAX ST) 1000 units tablet Take 1 tablet (1,000 Units total) by mouth 2 (two) times daily.  . ciprofloxacin (CIPRO) 500 MG tablet Take 1 tablet (500 mg total) by mouth 2 (two) times daily.  . cyanocobalamin 1000 MCG tablet Take 1,000 mcg by mouth daily.  . cyclobenzaprine (FLEXERIL) 10 MG tablet Take 1 tablet (10 mg total) by mouth 3 (three) times daily as needed for muscle spasms.  . ferrous sulfate 325 (65 FE) MG tablet Take 325 mg by mouth.  . furosemide (LASIX) 20 MG tablet TAKE (2) TABLETS DAILY (Patient taking differently: Take 40 mg by mouth 2 (two) times daily. )  . gabapentin (NEURONTIN) 300 MG capsule Take 300 mg by mouth 2 (two) times daily.  . insulin aspart protamine-  aspart (NOVOLOG MIX 70/30) (70-30) 100 UNIT/ML injection Inject into the skin. Inject 16 units into the skin in the morning and in the evening.  . insulin glargine (LANTUS) 100 UNIT/ML injection Inject 45 Units into the skin at bedtime.   Marland Kitchen levothyroxine (SYNTHROID, LEVOTHROID) 125 MCG tablet Take 125 mcg by mouth daily before breakfast.  . Magnesium 250 MG TABS Take 500 mg by mouth 2 (two) times daily.   . metFORMIN (GLUCOPHAGE) 1000 MG tablet Take 1,000 mg by mouth 2 (two) times daily with a meal.   . metoprolol succinate (TOPROL-XL) 100 MG 24 hr tablet Take 1 tablet (100 mg total) by mouth daily. Take with or immediately following a meal.  . metroNIDAZOLE (FLAGYL) 500 MG tablet Take 1 tablet (500 mg total) by mouth 3 (three) times daily.  . Omega-3 1000 MG CAPS Take 2 g by mouth 2 (two) times daily.   Marland Kitchen omeprazole (PRILOSEC) 10 MG capsule Take 20 mg by mouth 2 (two) times daily.   . Potassium 95 MG TBCR Take 1 tablet by mouth 2 (two) times daily.   . Salicylic Acid 2 % CREA Apply 1 application topically 2 (two) times daily as needed.  . vitamin E (E-400) 400 UNIT capsule Take 400 Units by mouth.  . nitroGLYCERIN (NITROSTAT) 0.4 MG SL tablet Place 0.4 mg under the tongue.   No facility-administered encounter medications on file as of 03/25/2018.     Activities of Daily Living In your present state of health, do you have any difficulty performing the following activities: 03/25/2018  Hearing? Y  Comment Ringing in both ears, has hearing aids, does not wear them  Vision? N  Difficulty concentrating or making decisions? Y  Comment Trouble remembering  Walking or climbing stairs? N  Dressing or bathing? N  Doing errands, shopping? N  Preparing Food and eating ? N  Using the Toilet? N  In the past six months, have you accidently leaked urine? N  Do you have problems with loss of bowel control? N  Managing your Medications? Y  Comment Wife takes care of medications  Managing your Finances?  N  Housekeeping or managing your Housekeeping? N  Some recent data might be hidden    Patient Care Team: Claretta Fraise, MD as PCP - General (Family Medicine) Frederik Pear Windy Fast, MD as Consulting Physician (Dermatology) Loney Loh, MD as Consulting Physician (Dermatology) Daryel November, MD as Consulting Physician (Urology) Louanna Raw, MD as Referring Physician (Diagnostic Radiology)    Patient gets most of his medical care at the [facility] in [place], [place] sent a records request to obtain recent records.  Assessment:   This is a routine wellness examination for [place].  Exercise Activities and Dietary recommendations  Patient states he usually eats 2 meals per day.  Usually eggs and bacon or sausage and toast, or cereal for breakfast, and protein and various starchy and non-starchy vegetables for supper.  States he does try to monitor his carbohydrate intake.  Recommended a diet of mostly vegetables and lean proteins, and fruits and whole grains in moderation.   Current Exercise Habits: Home exercise routine(Plays golf twice per week), Type of exercise: Other - see comments(Golf ), Time (Minutes): 60, Frequency (Times/Week): 2, Weekly Exercise (Minutes/Week): 120, Intensity: Mild, Exercise limited by: cardiac condition(s);orthopedic condition(s)  Goals    . Exercise 3x per week (30 min per time)     Exercise at the [facility] or walk for 1 hour 3 times per week.    . Weight (lb) < 225 lb (102.1 kg)     Initially would like to see decreased in weight of 10%       Fall Risk Fall Risk  03/25/2018 03/05/2018 02/01/2018 12/06/2017 09/21/2017  Falls in the past year? 1 No No No No  Number falls in past yr: 0 - - - -  Injury with Fall? 0 - - - -  Comment - - - - -  Risk for fall due to : - - - - -  Risk for fall due to: Comment - - - - -  Follow up Falls prevention discussed;Education provided - - - -   Is the patient's home free of loose throw rugs in walkways, pet  beds, electrical cords, etc?   yes      Grab bars in the bathroom? yes      Handrails on the stairs?   no stairs in home      Adequate lighting?   yes    Depression Screen PHQ 2/9 Scores 03/25/2018 03/05/2018 02/01/2018 12/06/2017  PHQ - 2 Score 0 0 0 0  PHQ- 9 Score - - - -  Exception Documentation - - - -    Cognitive Function MMSE - Mini Mental State Exam 03/25/2018 12/20/2016 12/14/2015 08/05/2014  Orientation to time 5 5 5 5   Orientation to Place 5 5 5 5   Registration 3 3 3 3   Attention/ Calculation 4 4 4 4   Recall 3 3 2 2   Language- name 2 objects 2 2 2 2   Language- repeat 1 1 1 1   Language- follow 3 step command 3 3 3 3   Language- read & follow direction 1 1 1 1   Write a sentence 1 1 1 1   Copy design 1 1 0 1  Total score 29 29 27 28         Immunization History  Administered Date(s) Administered  . Influenza Split 04/18/2017  . Influenza, High Dose Seasonal PF 03/06/2013, 01/26/2015  . Influenza-Unspecified 03/22/2014, 02/17/2016  . Pneumococcal Conjugate-13 03/22/2014  . Pneumococcal Polysaccharide-23 03/22/2013  . Zoster 11/19/2013    Qualifies for Shingles Vaccine? States he will get vaccine at the [place] if he decides he would like to get it.  Screening Tests Health Maintenance  Topic Date Due  . Hepatitis C Screening  07-07-1945  . OPHTHALMOLOGY EXAM  12/10/1954  . TETANUS/TDAP  12/10/1963  . COLONOSCOPY  12/10/1994  . HEMOGLOBIN A1C  02/15/2017  . INFLUENZA VACCINE  12/20/2017  . FOOT EXAM  12/20/2017  . URINE MICROALBUMIN  12/20/2017  . COLON CANCER SCREENING ANNUAL FOBT  12/07/2018  . DEXA  SCAN  02/02/2026  . PNA vac Low Risk Adult  Completed   Patient states he has had all of the above health maintenance tests that are due at the [place]- faxed a records request to their office to obtain.     Cancer Screenings: Lung: Low Dose CT Chest recommended if Age 76-80 years, 30 pack-year currently smoking OR have quit w/in 15years. Patient does not  qualify. Colorectal: up to date per patient, records requested.  Additional Screenings: Hepatitis C Screening: requested records from [place]:     Work on your goal of increasing your exercise to 3 times per week for 30 minutes.  Walking or working out at [employer] are great options.  When you go for your eye exam later this month please ask them to forward a copy of the results to our office.   I have personally reviewed and noted the following in the patient's chart:   . Medical and social history . Use of alcohol, tobacco or illicit drugs  . Current medications and supplements . Functional ability and status . Nutritional status . Physical activity . Advanced directives . List of other physicians . Hospitalizations, surgeries, and ER visits in previous 12 months . Vitals . Screenings to include cognitive, depression, and falls . Referrals and appointments  In addition, I have reviewed and discussed with patient certain preventive protocols, quality metrics, and best practice recommendations. A written personalized care plan for preventive services as well as general preventive health recommendations were provided to patient.     Jacon Whetzel M, RN  03/25/2018  I have reviewed and agree with the above AWV documentation.  Claretta Fraise, M.D.

## 2018-03-25 NOTE — Patient Instructions (Addendum)
Please work on your goal of increasing your exercise to 3 times per week for 30 minutes.  Walking or working out at [employer] are great options.   When you go for your eye exam later this month please ask them to forward a copy of the results to our office.  Thank you for coming in for your Annual Wellness Visit today!!   Preventive Care 65 Years and Older, Male Preventive care refers to lifestyle choices and visits with your health care provider that can promote health and wellness. What does preventive care include?  A yearly physical exam. This is also called an annual well check.  Dental exams once or twice a year.  Routine eye exams. Ask your health care provider how often you should have your eyes checked.  Personal lifestyle choices, including: ? Daily care of your teeth and gums. ? Regular physical activity. ? Eating a healthy diet. ? Avoiding tobacco and drug use. ? Limiting alcohol use. ? Practicing safe sex. ? Taking low doses of aspirin every day. ? Taking vitamin and mineral supplements as recommended by your health care provider. What happens during an annual well check? The services and screenings done by your health care provider during your annual well check will depend on your age, overall health, lifestyle risk factors, and family history of disease. Counseling Your health care provider may ask you questions about your:  Alcohol use.  Tobacco use.  Drug use.  Emotional well-being.  Home and relationship well-being.  Sexual activity.  Eating habits.  History of falls.  Memory and ability to understand (cognition).  Work and work Statistician.  Screening You may have the following tests or measurements:  Height, weight, and BMI.  Blood pressure.  Lipid and cholesterol levels. These may be checked every 5 years, or more frequently if you are over 73 years old.  Skin check.  Lung cancer screening. You may have this screening every year  starting at age 20 if you have a 30-pack-year history of smoking and currently smoke or have quit within the past 15 years.  Fecal occult blood test (FOBT) of the stool. You may have this test every year starting at age 23.  Flexible sigmoidoscopy or colonoscopy. You may have a sigmoidoscopy every 5 years or a colonoscopy every 10 years starting at age 18.  Prostate cancer screening. Recommendations will vary depending on your family history and other risks.  Hepatitis C blood test.  Hepatitis B blood test.  Sexually transmitted disease (STD) testing.  Diabetes screening. This is done by checking your blood sugar (glucose) after you have not eaten for a while (fasting). You may have this done every 1-3 years.  Abdominal aortic aneurysm (AAA) screening. You may need this if you are a current or former smoker.  Osteoporosis. You may be screened starting at age 60 if you are at high risk.  Talk with your health care provider about your test results, treatment options, and if necessary, the need for more tests. Vaccines Your health care provider may recommend certain vaccines, such as:  Influenza vaccine. This is recommended every year.  Tetanus, diphtheria, and acellular pertussis (Tdap, Td) vaccine. You may need a Td booster every 10 years.  Varicella vaccine. You may need this if you have not been vaccinated.  Zoster vaccine. You may need this after age 57.  Measles, mumps, and rubella (MMR) vaccine. You may need at least one dose of MMR if you were born in 1957 or later.  You may also need a second dose.  Pneumococcal 13-valent conjugate (PCV13) vaccine. One dose is recommended after age 89.  Pneumococcal polysaccharide (PPSV23) vaccine. One dose is recommended after age 2.  Meningococcal vaccine. You may need this if you have certain conditions.  Hepatitis A vaccine. You may need this if you have certain conditions or if you travel or work in places where you may be exposed  to hepatitis A.  Hepatitis B vaccine. You may need this if you have certain conditions or if you travel or work in places where you may be exposed to hepatitis B.  Haemophilus influenzae type b (Hib) vaccine. You may need this if you have certain risk factors.  Talk to your health care provider about which screenings and vaccines you need and how often you need them. This information is not intended to replace advice given to you by your health care provider. Make sure you discuss any questions you have with your health care provider. Document Released: 06/04/2015 Document Revised: 01/26/2016 Document Reviewed: 03/09/2015 Elsevier Interactive Patient Education  [employer].

## 2018-05-07 ENCOUNTER — Telehealth: Payer: Self-pay | Admitting: *Deleted

## 2018-05-07 MED ORDER — FUROSEMIDE 40 MG PO TABS: 40.0000 mg | ORAL_TABLET | Freq: Two times a day (BID) | ORAL | 0 refills | Status: DC

## 2018-05-07 NOTE — Telephone Encounter (Signed)
Needing clarification on Furosemide sig Pt started on new dose in hospital Verified with Monia Pouch, FNP and CareEverywhere Furosemide 40 mg BID sent to [facility]

## 2018-06-04 ENCOUNTER — Ambulatory Visit (INDEPENDENT_AMBULATORY_CARE_PROVIDER_SITE_OTHER): Payer: Medicare Other | Admitting: Family Medicine

## 2018-06-04 ENCOUNTER — Encounter: Payer: Self-pay | Admitting: Family Medicine

## 2018-06-04 VITALS — BP 102/61 | HR 60 | Temp 98.0°F | Ht 69.0 in | Wt 259.0 lb

## 2018-06-04 DIAGNOSIS — R05 Cough: Secondary | ICD-10-CM | POA: Diagnosis not present

## 2018-06-04 DIAGNOSIS — J0100 Acute maxillary sinusitis, unspecified: Secondary | ICD-10-CM

## 2018-06-04 DIAGNOSIS — R059 Cough, unspecified: Secondary | ICD-10-CM

## 2018-06-04 MED ORDER — DOXYCYCLINE HYCLATE 100 MG PO TABS: 100.0000 mg | ORAL_TABLET | Freq: Two times a day (BID) | ORAL | 0 refills | Status: DC

## 2018-06-04 MED ORDER — BENZONATATE 100 MG PO CAPS: 100.0000 mg | ORAL_CAPSULE | Freq: Three times a day (TID) | ORAL | 0 refills | Status: DC | PRN

## 2018-06-04 NOTE — Progress Notes (Signed)
Subjective: CC: cough/ dizziness PCP: Claretta Fraise, MD [id number] Edward Heath is a 73 y.o. male presenting to clinic today for:  1. Cough Patient reports onset of cough, sinus congestion, sinus headache just after Christmas.  He notes that he has been using over-the-counter Coricidin that has helped some with the cough but he continues to have postnasal drip, rhinorrhea and sinus pressure.  He takes a daily Claritin for this but he is not found it especially helpful.  He does have a nasal spray at home but does not use it.  He describes the cough as productive with phlegm that is dark in color.  He has not measured any fevers nor does he endorse any shortness of breath but he does state that he has some dizziness that onset yesterday.  Past medical history significant for cardiac disease.  He was hospitalized for heart failure as a complication of atrial fibrillation in the fall.  His wife notes that he has had some weight gain since discharge.  She keeps a close eye on fluids and has not noticed any significant fluid overload.  Follow-up with cardiology as expected sometime in February or March.  He is on Lasix 40 mg twice daily, this was an increase from his previous 20 mg twice daily.   ROS: Per HPI  No Known Allergies Past Medical History:  Diagnosis Date  . CHF (congestive heart failure) ([place])   . Diabetes mellitus without complication ([place])   . GERD (gastroesophageal reflux disease)   . Heart attack ([place]) 2004, 2015  . Hyperlipidemia   . Hypertension   . Lumbar vertebral fracture ([place]) 2012  . Skin cancer 06/2014   left cheek  . Stroke ([place])   . Thyroid disease   . TIA (transient ischemic attack) 2009    Current Outpatient Medications:  .  acetaminophen (TYLENOL) 650 MG CR tablet, Take 650 mg by mouth., Disp: , Rfl:  .  amiodarone (PACERONE) 200 MG tablet, Take 200 mg by mouth daily., Disp: , Rfl:  .  apixaban (ELIQUIS) 5 MG TABS tablet, Take 1 tablet (5 mg total) by mouth 2  (two) times daily. 2 BID X 7 days then 1 BID, Disp: 75 tablet, Rfl: 0 .  aspirin EC 81 MG tablet, Take 81 mg by mouth., Disp: , Rfl:  .  atorvastatin (LIPITOR) 40 MG tablet, Take 40 mg by mouth daily.  , Disp: , Rfl:  .  Cholecalciferol (VITAMIN Edward-1000 MAX ST) 1000 units tablet, Take 1 tablet (1,000 Units total) by mouth 2 (two) times daily., Disp: 60 tablet, Rfl: 11 .  cyanocobalamin 1000 MCG tablet, Take 1,000 mcg by mouth daily., Disp: , Rfl:  .  ferrous sulfate 325 (65 FE) MG tablet, Take 325 mg by mouth., Disp: , Rfl:  .  furosemide (LASIX) 40 MG tablet, Take 1 tablet (40 mg total) by mouth 2 (two) times daily., Disp: 60 tablet, Rfl: 0 .  gabapentin (NEURONTIN) 300 MG capsule, Take 300 mg by mouth 2 (two) times daily., Disp: , Rfl:  .  insulin aspart protamine- aspart (NOVOLOG MIX 70/30) (70-30) 100 UNIT/ML injection, Inject into the skin. Inject 16 units into the skin in the morning and in the evening., Disp: , Rfl:  .  insulin glargine (LANTUS) 100 UNIT/ML injection, Inject 45 Units into the skin at bedtime. , Disp: , Rfl:  .  levothyroxine (SYNTHROID, LEVOTHROID) 125 MCG tablet, Take 125 mcg by mouth daily before breakfast., Disp: , Rfl:  .  loratadine (CLARITIN)  10 MG tablet, Take 10 mg by mouth daily., Disp: , Rfl:  .  losartan (COZAAR) 50 MG tablet, Take 50 mg by mouth daily., Disp: , Rfl:  .  Magnesium 250 MG TABS, Take 500 mg by mouth 2 (two) times daily. , Disp: , Rfl:  .  metFORMIN (GLUCOPHAGE) 1000 MG tablet, Take 1,000 mg by mouth 2 (two) times daily with a meal. , Disp: , Rfl:  .  metoprolol succinate (TOPROL-XL) 100 MG 24 hr tablet, Take 1 tablet (100 mg total) by mouth daily. Take with or immediately following a meal., Disp: 90 tablet, Rfl: 0 .  Omega-3 1000 MG CAPS, Take 2 g by mouth 2 (two) times daily. , Disp: , Rfl:  .  omeprazole (PRILOSEC) 10 MG capsule, Take 20 mg by mouth 2 (two) times daily. , Disp: , Rfl:  .  Potassium 95 MG TBCR, Take 1 tablet by mouth 2 (two) times  daily. , Disp: , Rfl:  .  vitamin E (E-400) 400 UNIT capsule, Take 400 Units by mouth., Disp: , Rfl:  .  nitroGLYCERIN (NITROSTAT) 0.4 MG SL tablet, Place 0.4 mg under the tongue., Disp: , Rfl:  .  Salicylic Acid 2 % CREA, Apply 1 application topically 2 (two) times daily as needed., Disp: , Rfl:  Social History   Socioeconomic History  . Marital status: Married    Spouse name: Not on file  . Number of children: Not on file  . Years of education: Not on file  . Highest education level: Not on file  Occupational History  . Not on file  Social Needs  . Financial resource strain: Not on file  . Food insecurity:    Worry: Not on file    Inability: Not on file  . Transportation needs:    Medical: Not on file    Non-medical: Not on file  Tobacco Use  . Smoking status: Never Smoker  . Smokeless tobacco: Current User    Types: Chew  Substance and Sexual Activity  . Alcohol use: Yes    Comment: occ  . Drug use: No  . Sexual activity: Yes  Lifestyle  . Physical activity:    Days per week: Not on file    Minutes per session: Not on file  . Stress: Not on file  Relationships  . Social connections:    Talks on phone: Not on file    Gets together: Not on file    Attends religious service: Not on file    Active member of club or organization: Not on file    Attends meetings of clubs or organizations: Not on file    Relationship status: Not on file  . Intimate partner violence:    Fear of current or ex partner: Not on file    Emotionally abused: Not on file    Physically abused: Not on file    Forced sexual activity: Not on file  Other Topics Concern  . Not on file  Social History Narrative  . Not on file   Family History  Problem Relation Age of Onset  . Heart attack Mother   . Aneurysm Father   . Heart attack Brother 35    Objective: Office vital signs reviewed. BP 102/61   Pulse 60   Temp 98 F (36.7 C) (Oral)   Ht 5\' 9"  (1.753 m)   Wt 259 lb (117.5 kg)   BMI  38.25 kg/m   Physical Examination:  General: Awake, alert, well nourished, nontoxic.  No acute distress HEENT: Normal    Neck: No masses palpated. No lymphadenopathy    Ears: Tympanic membranes intact, normal light reflex, no erythema, no bulging    Eyes: PERRLA, extraocular membranes intact, sclera white    Nose: nasal turbinates moist, erythematous and edematous w/ clear nasal discharge    Throat: moist mucus membranes, no erythema, no tonsillar exudate.  Airway is patent Cardio: regular rate and rhythm, S1S2 heard, no murmurs appreciated Pulm: clear to auscultation bilaterally, no wheezes, rhonchi or rales; normal work of breathing on room air  Orthostatic VS for the past 24 hrs:  BP- Standing at 0 minutes  06/04/18 1349 (!) 88/57   Assessment/ Plan: 73 y.o. male   1. Acute non-recurrent maxillary sinusitis Given persistent symptoms, will proceed with empiric antibiotic treatment.  Doxycycline 100 mg p.o. twice daily prescribed.  Home care instructions reviewed.  Continue supportive care.  I have also sent in [facility] for cough.  I suspect that dizziness and sinus headache is related to sinus infection though I did recommend that he use compression hose to reduce any possible swelling in the lower extremity and keep blood flow towards the brain, as he was mildly orthostatic today with standing.  I have also advised him to schedule follow-up appointment with his cardiologist for medication reconciliation and recheck since he is now 3 months from his last hospitalization.  2. Cough As above.  Reasons for return and emergent evaluation emergency department discussed.  Both he and his wife was good understanding of follow-up PRN.   No orders of the defined types were placed in this encounter.  Meds ordered this encounter  Medications  . doxycycline (VIBRA-TABS) 100 MG tablet    Sig: Take 1 tablet (100 mg total) by mouth 2 (two) times daily.    Dispense:  20 tablet    Refill:   0  . benzonatate (TESSALON PERLES) 100 MG capsule    Sig: Take 1 capsule (100 mg total) by mouth 3 (three) times daily as needed for cough.    Dispense:  20 capsule    Refill:  [place], DO [place] [phone]

## 2018-06-04 NOTE — Patient Instructions (Signed)
I have prescribed you doxycycline to take 2 times daily with meals.  Try to aim for breakfast and supper.  This should take care of any bacterial infection.  I have also prescribed you Tessalon Perles to use up to 3 times daily if needed for cough.  I do think that your headache is related to both sinus infection and coughing.  Hopefully this will improve as the other 2 issues improve.  I would like you to see your cardiologist if you feel like you are putting on weight since your hospitalization last year.  You may need your medicines adjusted.  If you develop any shortness of breath, significant fluid buildup in your legs or worsening symptoms I want you to seek immediate medical attention.  Otherwise continue to follow-up with Dr. Livia Snellen as scheduled.  - Try to breathe moist air. Use a cold mist humidifier. - Consume warm fluids (soup or tea) to provide relief for a stuffy nose and to loosen phlegm. - For nasal stuffiness, try saline nasal spray or a Neti Pot. Afrin nasal spray can also be used but this product should not be used longer than 3 days or it will cause rebound nasal stuffiness (worsening nasal congestion). - For sore throat pain relief: use chloraseptic spray, suck on throat lozenges, hard candy or popsicles; gargle with warm salt water (1/4 tsp. salt per 8 oz. of water); and eat soft, bland foods. - Eat a well-balanced diet. If you cannot, ensure you are getting enough nutrients by taking a daily multivitamin. - Avoid dairy products, as they can thicken phlegm. - Avoid alcohol, as it impairs your body's immune system.

## 2018-06-24 DIAGNOSIS — I63019 Cerebral infarction due to thrombosis of unspecified vertebral artery: Secondary | ICD-10-CM | POA: Diagnosis not present

## 2018-07-15 DIAGNOSIS — C44329 Squamous cell carcinoma of skin of other parts of face: Secondary | ICD-10-CM | POA: Diagnosis not present

## 2018-07-15 DIAGNOSIS — L821 Other seborrheic keratosis: Secondary | ICD-10-CM | POA: Diagnosis not present

## 2018-07-15 DIAGNOSIS — D485 Neoplasm of uncertain behavior of skin: Secondary | ICD-10-CM | POA: Diagnosis not present

## 2018-07-15 DIAGNOSIS — L570 Actinic keratosis: Secondary | ICD-10-CM | POA: Diagnosis not present

## 2018-07-15 DIAGNOSIS — D1801 Hemangioma of skin and subcutaneous tissue: Secondary | ICD-10-CM | POA: Diagnosis not present

## 2018-07-15 DIAGNOSIS — D489 Neoplasm of uncertain behavior, unspecified: Secondary | ICD-10-CM | POA: Diagnosis not present

## 2018-07-15 DIAGNOSIS — Z85828 Personal history of other malignant neoplasm of skin: Secondary | ICD-10-CM | POA: Diagnosis not present

## 2018-07-15 DIAGNOSIS — I781 Nevus, non-neoplastic: Secondary | ICD-10-CM | POA: Diagnosis not present

## 2018-07-24 DIAGNOSIS — C44329 Squamous cell carcinoma of skin of other parts of face: Secondary | ICD-10-CM | POA: Diagnosis not present

## 2018-08-01 DIAGNOSIS — Z4802 Encounter for removal of sutures: Secondary | ICD-10-CM | POA: Diagnosis not present

## 2018-09-25 ENCOUNTER — Other Ambulatory Visit: Payer: Self-pay

## 2018-09-25 ENCOUNTER — Ambulatory Visit (INDEPENDENT_AMBULATORY_CARE_PROVIDER_SITE_OTHER): Payer: Medicare Other | Admitting: Family Medicine

## 2018-09-25 ENCOUNTER — Encounter: Payer: Self-pay | Admitting: Family Medicine

## 2018-09-25 DIAGNOSIS — E1159 Type 2 diabetes mellitus with other circulatory complications: Secondary | ICD-10-CM

## 2018-09-25 DIAGNOSIS — Z794 Long term (current) use of insulin: Secondary | ICD-10-CM | POA: Diagnosis not present

## 2018-09-25 NOTE — Progress Notes (Signed)
Subjective:    Patient ID: Edward Heath, male    DOB: 09/19/1945, 73 y.o.   MRN: [id number]   HPI: Edward Heath is a 73 y.o. male presenting for DM. Not checking glucose. Hurts his finger. Wants a Libre. Will check with his [place] doctor.   Putting on weight due to [place] sheltering. 20 lbs gained in three months. .  Checking BP everyfew weeks. Last was 110/60  Depression screen [facility] 2/9 03/25/2018 03/05/2018 02/01/2018 12/06/2017 09/21/2017  Decreased Interest 0 0 0 0 0  Down, Depressed, Hopeless 0 0 0 0 0  PHQ - 2 Score 0 0 0 0 0  Altered sleeping - - - - -  Tired, decreased energy - - - - -  Change in appetite - - - - -  Feeling bad or failure about yourself  - - - - -  Trouble concentrating - - - - -  Moving slowly or fidgety/restless - - - - -  Suicidal thoughts - - - - -  PHQ-9 Score - - - - -  Difficult doing work/chores - - - - -     Relevant past medical, surgical, family and social history reviewed and updated as indicated.  Interim medical history since our last visit reviewed. Allergies and medications reviewed and updated.  ROS:  Review of Systems  Constitutional: Positive for fatigue.  HENT: Negative.   Eyes: Negative for visual disturbance.  Respiratory: Negative for cough and shortness of breath.   Cardiovascular: Negative for chest pain and leg swelling.  Gastrointestinal: Negative for abdominal pain, diarrhea, nausea and vomiting.  Genitourinary: Negative for difficulty urinating.  Musculoskeletal: Negative for arthralgias and myalgias.  Skin: Negative for rash.  Neurological: Negative for headaches.  Psychiatric/Behavioral: Negative for sleep disturbance.     Social History   Tobacco Use  Smoking Status Never Smoker  Smokeless Tobacco Current User  . Types: Chew       Objective:     Wt Readings from Last 3 Encounters:  06/04/18 259 lb (117.5 kg)  03/25/18 252 lb (114.3 kg)  03/05/18 250 lb (113.4 kg)     Exam deferred. Pt. [place]  due to COVID 19. Phone visit performed.   Assessment & Plan:   1. Type 2 diabetes mellitus with other circulatory complication, with long-term current use of insulin (HCC)     No orders of the defined types were placed in this encounter.   Orders Placed This Encounter  Procedures  . Bayer DCA Hb A1c Waived  . CBC with Differential/Platelet  . CMP14+EGFR    Order Specific Question:   Has the patient fasted?    Answer:   Yes  . Lipid panel    Order Specific Question:   Has the patient fasted?    Answer:   Yes  . TSH + free T4      Diagnoses and all orders for this visit:  Type 2 diabetes mellitus with other circulatory complication, with long-term current use of insulin (HCC) -     Bayer DCA Hb A1c Waived -     CBC with Differential/Platelet -     CMP14+EGFR -     Lipid panel -     TSH + free T4    Virtual Visit via telephone Note  I discussed the limitations, risks, security and privacy concerns of performing an evaluation and management service by telephone and the availability of in person appointments. The patient was identified with two identifiers. Pt.expressed  understanding and agreed to proceed. Pt. Is at home. Dr. Livia Snellen is in his office.  Follow Up Instructions:   I discussed the assessment and treatment plan with the patient. The patient was provided an opportunity to ask questions and all were answered. The patient agreed with the plan and demonstrated an understanding of the instructions.   The patient was advised to call back or seek an in-person evaluation if the symptoms worsen or if the condition fails to improve as anticipated.  Strongly advised patent to begin checking glucose regularly. He will work through [place] to get [employer], but not willing to do fingersticks. I suspect weight gain is the source of energy loss, but need to check thyroid, Hb as well.  Total minutes including chart review and phone contact time: 40   Follow up plan: Return in about 6  months (around 03/28/2019).  Edward Fraise, MD [place]

## 2018-09-27 ENCOUNTER — Other Ambulatory Visit: Payer: Self-pay

## 2018-09-27 ENCOUNTER — Other Ambulatory Visit: Payer: Medicare Other

## 2018-09-27 ENCOUNTER — Other Ambulatory Visit: Payer: Self-pay | Admitting: *Deleted

## 2018-09-27 DIAGNOSIS — Z794 Long term (current) use of insulin: Secondary | ICD-10-CM | POA: Diagnosis not present

## 2018-09-27 DIAGNOSIS — E1159 Type 2 diabetes mellitus with other circulatory complications: Secondary | ICD-10-CM | POA: Diagnosis not present

## 2018-09-27 LAB — BAYER DCA HB A1C WAIVED: HB A1C (BAYER DCA - WAIVED): 9.6 % — ABNORMAL HIGH (ref <7.0)

## 2018-09-27 MED ORDER — INSULIN GLARGINE 100 UNIT/ML ~~LOC~~ SOLN: 55.0000 [IU] | Freq: Every day | SUBCUTANEOUS | 2 refills | Status: DC

## 2018-09-28 LAB — CMP14+EGFR
ALT: 32 IU/L (ref 0–44)
AST: 34 IU/L (ref 0–40)
Albumin/Globulin Ratio: 1.6 (ref 1.2–2.2)
Albumin: 4 g/dL (ref 3.7–4.7)
Alkaline Phosphatase: 65 IU/L (ref 39–117)
BUN/Creatinine Ratio: 12 (ref 10–24)
BUN: 15 mg/dL (ref 8–27)
Bilirubin Total: 0.4 mg/dL (ref 0.0–1.2)
CO2: 24 mmol/L (ref 20–29)
Calcium: 9.3 mg/dL (ref 8.6–10.2)
Chloride: 96 mmol/L (ref 96–106)
Creatinine, Ser: 1.26 mg/dL (ref 0.76–1.27)
GFR calc Af Amer: 65 mL/min/{1.73_m2} (ref >59)
GFR calc non Af Amer: 56 mL/min/{1.73_m2} — ABNORMAL LOW (ref >59)
Globulin, Total: 2.5 g/dL (ref 1.5–4.5)
Glucose: 250 mg/dL — ABNORMAL HIGH (ref 65–99)
Potassium: 4.4 mmol/L (ref 3.5–5.2)
Sodium: 141 mmol/L (ref 134–144)
Total Protein: 6.5 g/dL (ref 6.0–8.5)

## 2018-09-28 LAB — CBC WITH DIFFERENTIAL/PLATELET
Basophils Absolute: 0.1 10*3/uL (ref 0.0–0.2)
Basos: 1 %
EOS (ABSOLUTE): 0.3 10*3/uL (ref 0.0–0.4)
Eos: 4 %
Hematocrit: 37.9 % (ref 37.5–51.0)
Hemoglobin: 13.2 g/dL (ref 13.0–17.7)
Immature Grans (Abs): 0 10*3/uL (ref 0.0–0.1)
Immature Granulocytes: 0 %
Lymphocytes Absolute: 2.4 10*3/uL (ref 0.7–3.1)
Lymphs: 29 %
MCH: 30.3 pg (ref 26.6–33.0)
MCHC: 34.8 g/dL (ref 31.5–35.7)
MCV: 87 fL (ref 79–97)
Monocytes Absolute: 0.9 10*3/uL (ref 0.1–0.9)
Monocytes: 11 %
Neutrophils Absolute: 4.5 10*3/uL (ref 1.4–7.0)
Neutrophils: 55 %
Platelets: 238 10*3/uL (ref 150–450)
RBC: 4.36 x10E6/uL (ref 4.14–5.80)
RDW: 13.1 % (ref 11.6–15.4)
WBC: 8.2 10*3/uL (ref 3.4–10.8)

## 2018-09-28 LAB — TSH+FREE T4
Free T4: 1.83 ng/dL — ABNORMAL HIGH (ref 0.82–1.77)
TSH: 4.06 u[IU]/mL (ref 0.450–4.500)

## 2018-09-28 LAB — LIPID PANEL
Chol/HDL Ratio: 5.3 ratio — ABNORMAL HIGH (ref 0.0–5.0)
Cholesterol, Total: 190 mg/dL (ref 100–199)
HDL: 36 mg/dL — ABNORMAL LOW (ref >39)
Triglycerides: 469 mg/dL — ABNORMAL HIGH (ref 0–149)

## 2018-11-07 ENCOUNTER — Encounter: Payer: Self-pay | Admitting: Physician Assistant

## 2018-11-07 ENCOUNTER — Ambulatory Visit (INDEPENDENT_AMBULATORY_CARE_PROVIDER_SITE_OTHER): Payer: Medicare Other | Admitting: Physician Assistant

## 2018-11-07 ENCOUNTER — Ambulatory Visit (INDEPENDENT_AMBULATORY_CARE_PROVIDER_SITE_OTHER): Payer: Medicare Other

## 2018-11-07 ENCOUNTER — Other Ambulatory Visit: Payer: Self-pay

## 2018-11-07 VITALS — BP 99/55 | HR 61 | Temp 98.4°F | Ht 69.0 in | Wt 261.4 lb

## 2018-11-07 DIAGNOSIS — S60051A Contusion of right little finger without damage to nail, initial encounter: Secondary | ICD-10-CM | POA: Diagnosis not present

## 2018-11-07 DIAGNOSIS — S60221A Contusion of right hand, initial encounter: Secondary | ICD-10-CM

## 2018-11-07 DIAGNOSIS — M79641 Pain in right hand: Secondary | ICD-10-CM | POA: Diagnosis not present

## 2018-11-07 MED ORDER — HYDROCODONE-ACETAMINOPHEN 5-325 MG PO TABS: 1.0000 | ORAL_TABLET | ORAL | 0 refills | Status: DC | PRN

## 2018-11-07 NOTE — Patient Instructions (Signed)
Hand Contusion A hand contusion is a deep bruise to the hand. Deep bruises can happen when an injury causes bleeding under the skin. The skin over the bruise may be red and then turn blue, purple, or yellow. Minor injuries will give you a deep bruise that is painless, but deep bruises that are worse may stay painful and swollen for a few weeks. In general, the best treatment for this condition includes rest, ice, pressure (compression), and elevation. This is often called RICE therapy. Follow these instructions at home: RICE Therapy  Rest the injured area.  If directed, put ice on the injured area: ? Put ice in a plastic bag. ? Place a towel between your skin and the bag. ? Leave the ice on for 20 minutes, 2-3 times a day.  If directed, put light pressure on the injured area using an elastic wrap. ? Make sure the wrap is not too tight. If your fingers turn numb, cold, or blue, take the wrap off and put it back on more loosely. ? Remove and put the wrap back on as told by your doctor.  Raise (elevate) the injured area above the level of your heart while you are sitting or lying down. General instructions   Take over-the-counter and prescription medicines only as told by your doctor.  Protect your hand from getting more injured.  Keep all follow-up visits as told by your doctor. This is important. Contact a health care provider if:  Your symptoms do not get better after many days of treatment.  You have more redness, swelling, or pain in your hand or fingers.  You have trouble moving the injured area.  Medicine does not help your swelling or pain. Get help right away if:  You have very bad pain.  Your hand or fingers are numb.  Your hand or fingers turn very light (pale), blue, or cold.  You cannot move your hand or wrist.  Your hand feels warm when you touch it. This information is not intended to replace advice given to you by your health care provider. Make sure you  discuss any questions you have with your health care provider. Document Released: 10/25/2007 Document Revised: 01/05/2016 Document Reviewed: 03/17/2015 Elsevier Interactive Patient Education  2019 [employer]  

## 2018-11-11 ENCOUNTER — Encounter: Payer: Self-pay | Admitting: Physician Assistant

## 2018-11-11 NOTE — Progress Notes (Signed)
BP (!) 99/55   Pulse 61   Temp 98.4 F (36.9 C) (Oral)   Ht 5\' 9"  (1.753 m)   Wt 261 lb 6.4 oz (118.6 kg)   BMI 38.60 kg/m    Subjective:    Patient ID: Edward Heath, male    DOB: [birth year], 73 y.o.   MRN: [id number]  HPI: Edward Heath is a 73 y.o. male presenting on 11/07/2018 for Fall and Hand Pain (right and swollen )  This patient comes in for having fallen and hurt his right hand.  It is most tender along the fifth finger at the proximal phalangeal.  He does have a significant amount of bruising.  The patient is on aspirin and Eliquis.  He states that he just tripped over something he does not know of any other reason that he fell.  He has not been having recent falls.  He does have a little bit of soreness up his arm and a little bit on his knee.  Otherwise that is concentrated around the hand and wrist.  X-ray showed no fractures, there is a lot of arthritis changes.  We have asked him to wrap it and keep it protected for the next few weeks because of the significant contusion and strain that is happened to his hand.  Past Medical History:  Diagnosis Date  . CHF (congestive heart failure) ([place])   . Diabetes mellitus without complication ([place])   . GERD (gastroesophageal reflux disease)   . Heart attack ([place]) 2004, 2015  . Hyperlipidemia   . Hypertension   . Lumbar vertebral fracture ([place]) 2012  . Skin cancer 06/2014   left cheek  . Stroke ([place])   . Thyroid disease   . TIA (transient ischemic attack) 2009   Relevant past medical, surgical, family and social history reviewed and updated as indicated. Interim medical history since our last visit reviewed. Allergies and medications reviewed and updated. DATA REVIEWED: CHART IN EPIC  Family History reviewed for pertinent findings.  Review of Systems  Constitutional: Negative.  Negative for appetite change and fatigue.  Eyes: Negative for pain and visual disturbance.  Respiratory: Negative.  Negative for cough, chest  tightness, shortness of breath and wheezing.   Cardiovascular: Negative.  Negative for chest pain, palpitations and leg swelling.  Gastrointestinal: Negative.  Negative for abdominal pain, diarrhea, nausea and vomiting.  Genitourinary: Negative.   Musculoskeletal: Positive for arthralgias, joint swelling and myalgias.  Skin: Negative.  Negative for color change and rash.  Neurological: Negative.  Negative for weakness, numbness and headaches.  Psychiatric/Behavioral: Negative.     Allergies as of 11/07/2018   No Known Allergies     Medication List       Accurate as of November 07, 2018 11:59 PM. If you have any questions, ask your nurse or doctor.        acetaminophen 650 MG CR tablet Commonly known as: TYLENOL Take 650 mg by mouth.   amiodarone 200 MG tablet Commonly known as: PACERONE Take 200 mg by mouth daily.   apixaban 5 MG Tabs tablet Commonly known as: Eliquis Take 1 tablet (5 mg total) by mouth 2 (two) times daily. 2 BID X 7 days then 1 BID   aspirin EC 81 MG tablet Take 81 mg by mouth.   atorvastatin 40 MG tablet Commonly known as: LIPITOR Take 40 mg by mouth daily.   benzonatate 100 MG capsule Commonly known as: Tessalon Perles Take 1 capsule (100 mg total) by mouth  3 (three) times daily as needed for cough.   Cholecalciferol 25 MCG (1000 UT) tablet Commonly known as: Vitamin D-1000 Max St Take 1 tablet (1,000 Units total) by mouth 2 (two) times daily.   cyanocobalamin 1000 MCG tablet Take 1,000 mcg by mouth daily.   E-400 400 UNIT capsule Generic drug: vitamin E Take 400 Units by mouth.   ferrous sulfate 325 (65 FE) MG tablet Take 325 mg by mouth.   furosemide 40 MG tablet Commonly known as: LASIX Take 1 tablet (40 mg total) by mouth 2 (two) times daily.   gabapentin 300 MG capsule Commonly known as: NEURONTIN Take 300 mg by mouth 2 (two) times daily.   HYDROcodone-acetaminophen 5-325 MG tablet Commonly known as: Norco Take 1 tablet by mouth  every 4 (four) hours as needed for moderate pain. Started by: Terald Sleeper, PA-C   insulin aspart protamine- aspart (70-30) 100 UNIT/ML injection Commonly known as: NOVOLOG MIX 70/30 Inject into the skin. Inject 16 units into the skin in the morning and in the evening.   insulin glargine 100 UNIT/ML injection Commonly known as: Lantus Inject 0.55 mLs (55 Units total) into the skin at bedtime.   levothyroxine 125 MCG tablet Commonly known as: SYNTHROID Take 125 mcg by mouth daily before breakfast.   loratadine 10 MG tablet Commonly known as: CLARITIN Take 10 mg by mouth daily.   losartan 50 MG tablet Commonly known as: COZAAR Take 50 mg by mouth daily.   Magnesium 250 MG Tabs Take 500 mg by mouth 2 (two) times daily.   metFORMIN 1000 MG tablet Commonly known as: GLUCOPHAGE Take 1,000 mg by mouth 2 (two) times daily with a meal.   metoprolol succinate 100 MG 24 hr tablet Commonly known as: TOPROL-XL Take 1 tablet (100 mg total) by mouth daily. Take with or immediately following a meal.   nitroGLYCERIN 0.4 MG SL tablet Commonly known as: NITROSTAT Place 0.4 mg under the tongue.   Omega-3 1000 MG Caps Take 2 g by mouth 2 (two) times daily.   omeprazole 10 MG capsule Commonly known as: PRILOSEC Take 20 mg by mouth 2 (two) times daily.   Potassium 95 MG Tbcr Take 1 tablet by mouth 2 (two) times daily.   Salicylic Acid 2 % Crea Apply 1 application topically 2 (two) times daily as needed.          Objective:    BP (!) 99/55   Pulse 61   Temp 98.4 F (36.9 C) (Oral)   Ht 5\' 9"  (1.753 m)   Wt 261 lb 6.4 oz (118.6 kg)   BMI 38.60 kg/m   No Known Allergies  Wt Readings from Last 3 Encounters:  11/07/18 261 lb 6.4 oz (118.6 kg)  06/04/18 259 lb (117.5 kg)  03/25/18 252 lb (114.3 kg)    Physical Exam Vitals signs and nursing note reviewed.  Constitutional:      General: He is not in acute distress.    Appearance: He is well-developed.  HENT:     Head:  Normocephalic and atraumatic.  Eyes:     Conjunctiva/sclera: Conjunctivae normal.     Pupils: Pupils are equal, round, and reactive to light.  Cardiovascular:     Rate and Rhythm: Normal rate and regular rhythm.     Heart sounds: Normal heart sounds.  Pulmonary:     Effort: Pulmonary effort is normal. No respiratory distress.     Breath sounds: Normal breath sounds.  Musculoskeletal:  Right hand: He exhibits decreased range of motion, tenderness, bony tenderness and swelling. He exhibits no deformity and no laceration. Decreased strength noted.       Hands:  Skin:    General: Skin is warm and dry.  Psychiatric:        Behavior: Behavior normal.         Assessment & Plan:   1. Hand pain, right - DG Hand Complete Right; Future  2. Contusion of right little finger without damage to nail, initial encounter Brace Ice  3. Contusion of right hand, initial encounter Brace ice  Continue all other maintenance medications as listed above.  Follow up plan: No follow-ups on file.  Educational handout given for contusion  Terald Sleeper PA-C [facility] [address]  [place], [place] [address] [phone]   11/11/2018, 1:49 PM

## 2018-12-10 ENCOUNTER — Other Ambulatory Visit: Payer: Self-pay

## 2018-12-11 ENCOUNTER — Ambulatory Visit (INDEPENDENT_AMBULATORY_CARE_PROVIDER_SITE_OTHER): Payer: Medicare Other | Admitting: Family Medicine

## 2018-12-11 ENCOUNTER — Encounter: Payer: Self-pay | Admitting: Family Medicine

## 2018-12-11 ENCOUNTER — Ambulatory Visit (HOSPITAL_COMMUNITY)
Admission: RE | Admit: 2018-12-11 | Discharge: 2018-12-11 | Disposition: A | Discharge status: Home / Self Care | Payer: Medicare Other | Source: Ambulatory Visit | Attending: Family Medicine | Admitting: Family Medicine

## 2018-12-11 VITALS — BP 105/61 | HR 66 | Temp 98.4°F | Ht 69.0 in | Wt 263.6 lb

## 2018-12-11 DIAGNOSIS — Z9181 History of falling: Secondary | ICD-10-CM

## 2018-12-11 DIAGNOSIS — R519 Headache, unspecified: Secondary | ICD-10-CM

## 2018-12-11 DIAGNOSIS — M25822 Other specified joint disorders, left elbow: Secondary | ICD-10-CM | POA: Diagnosis not present

## 2018-12-11 DIAGNOSIS — R200 Anesthesia of skin: Secondary | ICD-10-CM

## 2018-12-11 DIAGNOSIS — R51 Headache: Secondary | ICD-10-CM

## 2018-12-11 DIAGNOSIS — S0993XA Unspecified injury of face, initial encounter: Secondary | ICD-10-CM | POA: Diagnosis not present

## 2018-12-11 MED ORDER — PREDNISONE 10 MG (21) PO TBPK: ORAL_TABLET | ORAL | 0 refills | Status: DC

## 2018-12-11 NOTE — Progress Notes (Signed)
Assessment & Plan:  1. Numbness of fingers - Discussed that if symptoms do not improve he may need nerve conduction studies.  - predniSONE (STERAPRED UNI-PAK 21 TAB) 10 MG (21) TBPK tablet; Use as directed on back of pill pack  Dispense: 21 tablet; Refill: 0 - DG Elbow 2 Views Left; Future  2. Pain of cheek - Tylenol routinely for pain.  - DG Facial Bones 1-2 Views; Future  3. History of recent fall - DG Facial Bones 1-2 Views; Future - DG Elbow 2 Views Left; Future   Follow up plan: Return if symptoms worsen or fail to improve.  Edward Limes, MSN, APRN, FNP-C [facility]  Subjective:   Patient ID: Edward Heath, male    DOB: 1945-09-07, 73 y.o.   MRN: [id number]  HPI: Edward Heath is a 73 y.o. male presenting on 12/11/2018 for numbness in two left fingers and pain right cheek bone  Patient is experiencing tingling all the times in his left ring and pinky fingers. This started 2-3 weeks ago or a little longer. He has had a couple recent falls; the biggest one was 5 weeks ago. Nothing makes it better or worse. He is able to "work them". He does take Tylenol PRN but this has not helped. Unable to take NSAIDs due to age and use of Eliquis.   Patient fell and hit his right cheek on a block of wood 5 weeks ago when he was helping his son work on a car. His face did bruise and swell. He had a cut on the right cheek,  which has healed. The bruising has faded. He does feel like the swelling is getting worse though. He describes the pain in his cheek as sharp with minimal touch and rates it 5/10 on average. He does not feel occasionalTylenol touches this pain.    ROS: Negative unless specifically indicated above in HPI.   Relevant past medical history reviewed and updated as indicated.   Allergies and medications reviewed and updated.   Current Outpatient Medications:  .  acetaminophen (TYLENOL) 650 MG CR tablet, Take 650 mg by mouth., Disp: , Rfl:  .   amiodarone (PACERONE) 200 MG tablet, Take 200 mg by mouth daily., Disp: , Rfl:  .  apixaban (ELIQUIS) 5 MG TABS tablet, Take 1 tablet (5 mg total) by mouth 2 (two) times daily. 2 BID X 7 days then 1 BID, Disp: 75 tablet, Rfl: 0 .  aspirin EC 81 MG tablet, Take 81 mg by mouth., Disp: , Rfl:  .  atorvastatin (LIPITOR) 40 MG tablet, Take 40 mg by mouth daily.  , Disp: , Rfl:  .  Cholecalciferol (VITAMIN D-1000 MAX ST) 1000 units tablet, Take 1 tablet (1,000 Units total) by mouth 2 (two) times daily., Disp: 60 tablet, Rfl: 11 .  cyanocobalamin 1000 MCG tablet, Take 1,000 mcg by mouth daily., Disp: , Rfl:  .  ferrous sulfate 325 (65 FE) MG tablet, Take 325 mg by mouth., Disp: , Rfl:  .  furosemide (LASIX) 40 MG tablet, Take 1 tablet (40 mg total) by mouth 2 (two) times daily., Disp: 60 tablet, Rfl: 0 .  gabapentin (NEURONTIN) 300 MG capsule, Take 300 mg by mouth 2 (two) times daily., Disp: , Rfl:  .  insulin aspart protamine- aspart (NOVOLOG MIX 70/30) (70-30) 100 UNIT/ML injection, Inject into the skin. Inject 16 units into the skin in the morning and in the evening., Disp: , Rfl:  .  insulin glargine (  LANTUS) 100 UNIT/ML injection, Inject 0.55 mLs (55 Units total) into the skin at bedtime., Disp: 10 mL, Rfl: 2 .  levothyroxine (SYNTHROID, LEVOTHROID) 125 MCG tablet, Take 125 mcg by mouth daily before breakfast., Disp: , Rfl:  .  loratadine (CLARITIN) 10 MG tablet, Take 10 mg by mouth daily., Disp: , Rfl:  .  losartan (COZAAR) 50 MG tablet, Take 50 mg by mouth daily., Disp: , Rfl:  .  Magnesium 250 MG TABS, Take 500 mg by mouth 2 (two) times daily. , Disp: , Rfl:  .  metFORMIN (GLUCOPHAGE) 1000 MG tablet, Take 1,000 mg by mouth 2 (two) times daily with a meal. , Disp: , Rfl:  .  metoprolol succinate (TOPROL-XL) 100 MG 24 hr tablet, Take 1 tablet (100 mg total) by mouth daily. Take with or immediately following a meal., Disp: 90 tablet, Rfl: 0 .  Omega-3 1000 MG CAPS, Take 2 g by mouth 2 (two) times  daily. , Disp: , Rfl:  .  omeprazole (PRILOSEC) 10 MG capsule, Take 20 mg by mouth 2 (two) times daily. , Disp: , Rfl:  .  Potassium 95 MG TBCR, Take 1 tablet by mouth 2 (two) times daily. , Disp: , Rfl:  .  vitamin E (E-400) 400 UNIT capsule, Take 400 Units by mouth., Disp: , Rfl:  .  nitroGLYCERIN (NITROSTAT) 0.4 MG SL tablet, Place 0.4 mg under the tongue., Disp: , Rfl:  .  predniSONE (STERAPRED UNI-PAK 21 TAB) 10 MG (21) TBPK tablet, Use as directed on back of pill pack, Disp: 21 tablet, Rfl: 0  No Known Allergies  Objective:   BP 105/61   Pulse 66   Temp 98.4 F (36.9 C) (Oral)   Ht 5\' 9"  (1.753 m)   Wt 263 lb 9.6 oz (119.6 kg)   BMI 38.93 kg/m    Physical Exam Vitals signs reviewed.  Constitutional:      General: He is not in acute distress.    Appearance: Normal appearance. He is obese. He is not ill-appearing, toxic-appearing or diaphoretic.  HENT:     Head: Normocephalic and atraumatic.     Comments: Bruise to right cheek that has almost completely faded. He does continue to have swelling under the right eye and cheek area. Pain with light touch.  Eyes:     General: No scleral icterus.       Right eye: No discharge.        Left eye: No discharge.     Conjunctiva/sclera: Conjunctivae normal.  Neck:     Musculoskeletal: Normal range of motion.  Cardiovascular:     Rate and Rhythm: Normal rate and regular rhythm.     Heart sounds: Normal heart sounds. No murmur. No friction rub. No gallop.   Pulmonary:     Effort: Pulmonary effort is normal. No respiratory distress.     Breath sounds: Normal breath sounds. No stridor. No wheezing, rhonchi or rales.  Musculoskeletal: Normal range of motion.     Left elbow: He exhibits laceration (small skin tear). He exhibits normal range of motion, no swelling, no effusion and no deformity. No tenderness found.     Comments: Full ROM in all fingers.   Skin:    General: Skin is warm and dry.  Neurological:     Mental Status: He is  alert and oriented to person, place, and time. Mental status is at baseline.  Psychiatric:        Mood and Affect: Mood normal.  Behavior: Behavior normal.        Thought Content: Thought content normal.        Judgment: Judgment normal.

## 2018-12-19 ENCOUNTER — Telehealth: Payer: Self-pay | Admitting: Family Medicine

## 2018-12-19 DIAGNOSIS — R200 Anesthesia of skin: Secondary | ICD-10-CM

## 2018-12-19 NOTE — Telephone Encounter (Signed)
What symptoms do you have? Two fingers are numb/tingling   How long have you been sick? For about two weeks  Have you been seen for this problem? Yes by Karsten Fells, was told to call her back if no better he states his fingers are not better  If your provider decides to give you a prescription, which pharmacy would you like for it to be sent to? [facility]    Patient informed that this information will be sent to the clinical staff for review and that they should receive a follow up call.

## 2018-12-19 NOTE — Telephone Encounter (Signed)
Patient aware that order placed for nerve conduction study.

## 2018-12-23 DIAGNOSIS — R202 Paresthesia of skin: Secondary | ICD-10-CM | POA: Diagnosis not present

## 2018-12-23 DIAGNOSIS — R200 Anesthesia of skin: Secondary | ICD-10-CM | POA: Diagnosis not present

## 2018-12-23 DIAGNOSIS — Z8673 Personal history of transient ischemic attack (TIA), and cerebral infarction without residual deficits: Secondary | ICD-10-CM | POA: Diagnosis not present

## 2018-12-31 DIAGNOSIS — R202 Paresthesia of skin: Secondary | ICD-10-CM | POA: Diagnosis not present

## 2018-12-31 DIAGNOSIS — R200 Anesthesia of skin: Secondary | ICD-10-CM | POA: Diagnosis not present

## 2019-01-14 ENCOUNTER — Encounter: Payer: Self-pay | Admitting: *Deleted

## 2019-01-15 DIAGNOSIS — L570 Actinic keratosis: Secondary | ICD-10-CM | POA: Diagnosis not present

## 2019-01-15 DIAGNOSIS — Z85828 Personal history of other malignant neoplasm of skin: Secondary | ICD-10-CM | POA: Diagnosis not present

## 2019-02-03 ENCOUNTER — Other Ambulatory Visit: Payer: Self-pay

## 2019-02-03 ENCOUNTER — Encounter: Payer: Self-pay | Admitting: Family Medicine

## 2019-02-03 ENCOUNTER — Ambulatory Visit (INDEPENDENT_AMBULATORY_CARE_PROVIDER_SITE_OTHER): Payer: Medicare Other | Admitting: Family Medicine

## 2019-02-03 DIAGNOSIS — J069 Acute upper respiratory infection, unspecified: Secondary | ICD-10-CM | POA: Diagnosis not present

## 2019-02-03 MED ORDER — AMOXICILLIN-POT CLAVULANATE 875-125 MG PO TABS: 1.0000 | ORAL_TABLET | Freq: Two times a day (BID) | ORAL | 0 refills | Status: DC

## 2019-02-03 MED ORDER — BENZONATATE 100 MG PO CAPS: 100.0000 mg | ORAL_CAPSULE | Freq: Three times a day (TID) | ORAL | 0 refills | Status: DC | PRN

## 2019-02-03 MED ORDER — ALBUTEROL SULFATE HFA 108 (90 BASE) MCG/ACT IN AERS: 2.0000 | INHALATION_SPRAY | Freq: Four times a day (QID) | RESPIRATORY_TRACT | 0 refills | Status: DC | PRN

## 2019-02-03 NOTE — Progress Notes (Signed)
Virtual Visit via telephone Note Due to COVID-19 pandemic this visit was conducted virtually. This visit type was conducted due to national recommendations for restrictions regarding the COVID-19 Pandemic (e.g. social distancing, sheltering in place) in an effort to limit this patient's exposure and mitigate transmission in our community. All issues noted in this document were discussed and addressed.  A physical exam was not performed with this format.   I connected with Edward Heath on 02/03/19 at 1125 by telephone and verified that I am speaking with the correct person using two identifiers. Edward Heath is currently located at home and family is currently with them during visit. The provider, Monia Pouch, FNP is located in their office at time of visit.  I discussed the limitations, risks, security and privacy concerns of performing an evaluation and management service by telephone and the availability of in person appointments. I also discussed with the patient that there may be a patient responsible charge related to this service. The patient expressed understanding and agreed to proceed.  Subjective:  Patient ID: Edward Heath, male    DOB: 02/07/1946, 73 y.o.   MRN: [id number]  Chief Complaint:  Cough and URI   HPI: Edward Heath is a 73 y.o. male presenting on 02/03/2019 for Cough and URI   URI  This is a new problem. The current episode started 1 to 4 weeks ago. The problem has been gradually worsening. There has been no fever. Associated symptoms include congestion, coughing, headaches, rhinorrhea, a sore throat and swollen glands. Pertinent negatives include no chest pain, diarrhea, dysuria, ear pain, joint pain, joint swelling, nausea, neck pain, plugged ear sensation, rash, sinus pain, sneezing, vomiting or wheezing. Treatments tried: Coricidin HBP. The treatment provided mild relief.     Relevant past medical, surgical, family, and social history reviewed and  updated as indicated.  Allergies and medications reviewed and updated.   Past Medical History:  Diagnosis Date  . CHF (congestive heart failure) ([place])   . Diabetes mellitus without complication ([place])   . GERD (gastroesophageal reflux disease)   . Heart attack ([place]) 2004, 2015  . Hyperlipidemia   . Hypertension   . Lumbar vertebral fracture ([place]) 2012  . Skin cancer 06/2014   left cheek  . Stroke ([place])   . Thyroid disease   . TIA (transient ischemic attack) 2009    Past Surgical History:  Procedure Laterality Date  . CARDIAC DEFIBRILLATOR PLACEMENT  12/2003, Repaired in 2012  . KNEE SURGERY Right 1994  . SKIN CANCER EXCISION  06/2014    Social History   Socioeconomic History  . Marital status: Married    Spouse name: Not on file  . Number of children: Not on file  . Years of education: Not on file  . Highest education level: Not on file  Occupational History  . Not on file  Social Needs  . Financial resource strain: Not on file  . Food insecurity    Worry: Not on file    Inability: Not on file  . Transportation needs    Medical: Not on file    Non-medical: Not on file  Tobacco Use  . Smoking status: Never Smoker  . Smokeless tobacco: Current User    Types: Chew  Substance and Sexual Activity  . Alcohol use: Yes    Comment: occ  . Drug use: No  . Sexual activity: Yes  Lifestyle  . Physical activity    Days per week: Not on file  Minutes per session: Not on file  . Stress: Not on file  Relationships  . Social Herbalist on phone: Not on file    Gets together: Not on file    Attends religious service: Not on file    Active member of club or organization: Not on file    Attends meetings of clubs or organizations: Not on file    Relationship status: Not on file  . Intimate partner violence    Fear of current or ex partner: Not on file    Emotionally abused: Not on file    Physically abused: Not on file    Forced sexual activity: Not on file   Other Topics Concern  . Not on file  Social History Narrative  . Not on file    Outpatient Encounter Medications as of 02/03/2019  Medication Sig  . acetaminophen (TYLENOL) 650 MG CR tablet Take 650 mg by mouth.  Marland Kitchen albuterol (VENTOLIN HFA) 108 (90 Base) MCG/ACT inhaler Inhale 2 puffs into the lungs every 6 (six) hours as needed for wheezing or shortness of breath.  Marland Kitchen amiodarone (PACERONE) 200 MG tablet Take 200 mg by mouth daily.  Marland Kitchen amoxicillin-clavulanate (AUGMENTIN) 875-125 MG tablet Take 1 tablet by mouth 2 (two) times daily for 10 days.  Marland Kitchen apixaban (ELIQUIS) 5 MG TABS tablet Take 1 tablet (5 mg total) by mouth 2 (two) times daily. 2 BID X 7 days then 1 BID  . aspirin EC 81 MG tablet Take 81 mg by mouth.  Marland Kitchen atorvastatin (LIPITOR) 40 MG tablet Take 40 mg by mouth daily.    . benzonatate (TESSALON PERLES) 100 MG capsule Take 1 capsule (100 mg total) by mouth 3 (three) times daily as needed for cough.  . Cholecalciferol (VITAMIN D-1000 MAX ST) 1000 units tablet Take 1 tablet (1,000 Units total) by mouth 2 (two) times daily.  . cyanocobalamin 1000 MCG tablet Take 1,000 mcg by mouth daily.  . ferrous sulfate 325 (65 FE) MG tablet Take 325 mg by mouth.  . furosemide (LASIX) 40 MG tablet Take 1 tablet (40 mg total) by mouth 2 (two) times daily.  Marland Kitchen gabapentin (NEURONTIN) 300 MG capsule Take 300 mg by mouth 2 (two) times daily.  . insulin aspart protamine- aspart (NOVOLOG MIX 70/30) (70-30) 100 UNIT/ML injection Inject into the skin. Inject 16 units into the skin in the morning and in the evening.  . insulin glargine (LANTUS) 100 UNIT/ML injection Inject 0.55 mLs (55 Units total) into the skin at bedtime.  Marland Kitchen levothyroxine (SYNTHROID, LEVOTHROID) 125 MCG tablet Take 125 mcg by mouth daily before breakfast.  . loratadine (CLARITIN) 10 MG tablet Take 10 mg by mouth daily.  Marland Kitchen losartan (COZAAR) 50 MG tablet Take 50 mg by mouth daily.  . Magnesium 250 MG TABS Take 500 mg by mouth 2 (two) times daily.    . metFORMIN (GLUCOPHAGE) 1000 MG tablet Take 1,000 mg by mouth 2 (two) times daily with a meal.   . metoprolol succinate (TOPROL-XL) 100 MG 24 hr tablet Take 1 tablet (100 mg total) by mouth daily. Take with or immediately following a meal.  . nitroGLYCERIN (NITROSTAT) 0.4 MG SL tablet Place 0.4 mg under the tongue.  . Omega-3 1000 MG CAPS Take 2 g by mouth 2 (two) times daily.   Marland Kitchen omeprazole (PRILOSEC) 10 MG capsule Take 20 mg by mouth 2 (two) times daily.   . Potassium 95 MG TBCR Take 1 tablet by mouth 2 (two) times daily.   Marland Kitchen  predniSONE (STERAPRED UNI-PAK 21 TAB) 10 MG (21) TBPK tablet Use as directed on back of pill pack  . vitamin E (E-400) 400 UNIT capsule Take 400 Units by mouth.   No facility-administered encounter medications on file as of 02/03/2019.     No Known Allergies  Review of Systems  Constitutional: Positive for activity change. Negative for appetite change, chills, diaphoresis, fatigue, fever and unexpected weight change.  HENT: Positive for congestion, postnasal drip, rhinorrhea and sore throat. Negative for dental problem, drooling, ear discharge, ear pain, facial swelling, hearing loss, mouth sores, nosebleeds, sinus pressure, sinus pain, sneezing, tinnitus, trouble swallowing and voice change.   Eyes: Negative for photophobia and visual disturbance.  Respiratory: Positive for cough. Negative for apnea, choking, chest tightness, shortness of breath, wheezing and stridor.   Cardiovascular: Negative for chest pain and palpitations.  Gastrointestinal: Negative for abdominal distention, anal bleeding, blood in stool, constipation, diarrhea, nausea, rectal pain and vomiting.  Genitourinary: Negative for decreased urine volume, difficulty urinating and dysuria.  Musculoskeletal: Positive for myalgias. Negative for arthralgias, back pain, joint pain and neck pain.  Skin: Negative for color change, pallor and rash.  Neurological: Positive for headaches. Negative for dizziness,  syncope, weakness and light-headedness.  All other systems reviewed and are negative.        Observations/Objective: No vital signs or physical exam, this was a telephone or virtual health encounter.  Pt alert and oriented, answers all questions appropriately, and able to speak in full sentences.    Assessment and Plan: Edward Heath was seen today for cough and uri.  Diagnoses and all orders for this visit:  URI with cough and congestion Symptoms consistent with URI. Due to age and comorbidities will place on Augmentin for 10 days. Albuterol inhaler as needed for cough. Can use tessalon perles as needed for cough. Augmentin as prescribed. Continue Coricidin HBP as needed for symptomatic care. Report any new, worsening, or persistent symptoms. Medications as prescribed.  -     benzonatate (TESSALON PERLES) 100 MG capsule; Take 1 capsule (100 mg total) by mouth 3 (three) times daily as needed for cough. -     albuterol (VENTOLIN HFA) 108 (90 Base) MCG/ACT inhaler; Inhale 2 puffs into the lungs every 6 (six) hours as needed for wheezing or shortness of breath. -     amoxicillin-clavulanate (AUGMENTIN) 875-125 MG tablet; Take 1 tablet by mouth 2 (two) times daily for 10 days.     Follow Up Instructions: Return if symptoms worsen or fail to improve.    I discussed the assessment and treatment plan with the patient. The patient was provided an opportunity to ask questions and all were answered. The patient agreed with the plan and demonstrated an understanding of the instructions.   The patient was advised to call back or seek an in-person evaluation if the symptoms worsen or if the condition fails to improve as anticipated.  The above assessment and management plan was discussed with the patient. The patient verbalized understanding of and has agreed to the management plan. Patient is aware to call the clinic if they develop any new symptoms or if symptoms persist or worsen. Patient is aware  when to return to the clinic for a follow-up visit. Patient educated on when it is appropriate to go to the emergency department.    I provided 15 minutes of non-face-to-face time during this encounter. The call started at 1125. The call ended at 1140. The other time was used for coordination of care.  Monia Pouch, FNP-C [facility] [address] [place], [place] [address] [phone] 02/03/19

## 2019-02-10 ENCOUNTER — Telehealth: Payer: Self-pay | Admitting: Family Medicine

## 2019-02-10 ENCOUNTER — Other Ambulatory Visit: Payer: Self-pay | Admitting: Family Medicine

## 2019-02-10 DIAGNOSIS — J069 Acute upper respiratory infection, unspecified: Secondary | ICD-10-CM

## 2019-02-10 MED ORDER — AZITHROMYCIN 250 MG PO TABS: ORAL_TABLET | ORAL | 0 refills | Status: DC

## 2019-02-10 NOTE — Telephone Encounter (Signed)
Please contact the patient He should get COVID test (ordered) also I sent in z-pack to take the place of augmentin

## 2019-02-10 NOTE — Telephone Encounter (Signed)
Patient aware.

## 2019-02-11 ENCOUNTER — Other Ambulatory Visit: Payer: Self-pay

## 2019-02-11 DIAGNOSIS — Z20822 Contact with and (suspected) exposure to covid-19: Secondary | ICD-10-CM

## 2019-02-11 DIAGNOSIS — R6889 Other general symptoms and signs: Secondary | ICD-10-CM | POA: Diagnosis not present

## 2019-02-12 LAB — NOVEL CORONAVIRUS, NAA: SARS-CoV-2, NAA: NOT DETECTED

## 2019-02-20 ENCOUNTER — Telehealth: Payer: Self-pay | Admitting: Family Medicine

## 2019-02-20 MED ORDER — FREESTYLE LIBRE 14 DAY SENSOR MISC: 1.0000 | 11 refills | Status: DC

## 2019-02-20 MED ORDER — FREESTYLE LIBRE 14 DAY READER DEVI: 1.0000 | Freq: Every day | 0 refills | Status: DC

## 2019-02-20 NOTE — Telephone Encounter (Signed)
Rx sent- patient aware.  

## 2019-02-21 DIAGNOSIS — Z23 Encounter for immunization: Secondary | ICD-10-CM | POA: Diagnosis not present

## 2019-03-27 ENCOUNTER — Ambulatory Visit (INDEPENDENT_AMBULATORY_CARE_PROVIDER_SITE_OTHER): Payer: Medicare Other | Admitting: Family Medicine

## 2019-03-27 DIAGNOSIS — E1159 Type 2 diabetes mellitus with other circulatory complications: Secondary | ICD-10-CM

## 2019-03-27 DIAGNOSIS — J40 Bronchitis, not specified as acute or chronic: Secondary | ICD-10-CM

## 2019-03-27 DIAGNOSIS — Z794 Long term (current) use of insulin: Secondary | ICD-10-CM | POA: Diagnosis not present

## 2019-03-27 DIAGNOSIS — J329 Chronic sinusitis, unspecified: Secondary | ICD-10-CM | POA: Diagnosis not present

## 2019-03-27 MED ORDER — INSULIN ASPART PROT & ASPART (70-30 MIX) 100 UNIT/ML ~~LOC~~ SUSP: 20.0000 [IU] | Freq: Two times a day (BID) | SUBCUTANEOUS | 11 refills | Status: DC

## 2019-03-27 MED ORDER — AMOXICILLIN-POT CLAVULANATE 875-125 MG PO TABS: 1.0000 | ORAL_TABLET | Freq: Two times a day (BID) | ORAL | 0 refills | Status: DC

## 2019-03-27 NOTE — Progress Notes (Signed)
Subjective:    Patient ID: Edward Heath, male    DOB: 06-Nov-1945, 73 y.o.   MRN: [id number]   HPI: Edward Heath is a 73 y.o. male presenting for Chest congestion, wheezing, dry cough. No fever. Had COVID test last month and has been self isolating ever since even though it was negative. Declines to be testedd again.   presents forFollow-up of diabetes. Patient checks blood sugar at home.   118-129  fasting and 130-140 postprandial Patient denies symptoms such as polyuria, polydipsia, excessive hunger, nausea No significant hypoglycemic spells noted. Medications reviewed. Pt reports taking them regularly without complication/adverse reaction being reported today.  Checking feet daily. Eye appt. Coming up this month. Had A1c through [place] was 8. "something"     Depression screen [facility] 2/9 12/11/2018 11/07/2018 03/25/2018 03/05/2018 02/01/2018  Decreased Interest 0 0 0 0 0  Down, Depressed, Hopeless 0 0 0 0 0  PHQ - 2 Score 0 0 0 0 0  Altered sleeping - - - - -  Tired, decreased energy - - - - -  Change in appetite - - - - -  Feeling bad or failure about yourself  - - - - -  Trouble concentrating - - - - -  Moving slowly or fidgety/restless - - - - -  Suicidal thoughts - - - - -  PHQ-9 Score - - - - -  Difficult doing work/chores - - - - -     Relevant past medical, surgical, family and social history reviewed and updated as indicated.  Interim medical history since our last visit reviewed. Allergies and medications reviewed and updated.  ROS:  Review of Systems  Constitutional: Negative.  Negative for fever.  HENT: Negative.   Eyes: Negative for visual disturbance.  Respiratory: Positive for cough, shortness of breath and wheezing.   Cardiovascular: Negative for chest pain and leg swelling.  Gastrointestinal: Negative for abdominal pain, diarrhea, nausea and vomiting.  Genitourinary: Negative for difficulty urinating.  Musculoskeletal: Negative for arthralgias and  myalgias.  Skin: Negative for rash.  Neurological: Negative for headaches.  Psychiatric/Behavioral: Negative for sleep disturbance.     Social History   Tobacco Use  Smoking Status Never Smoker  Smokeless Tobacco Current User  . Types: Chew       Objective:     Wt Readings from Last 3 Encounters:  12/11/18 263 lb 9.6 oz (119.6 kg)  11/07/18 261 lb 6.4 oz (118.6 kg)  06/04/18 259 lb (117.5 kg)     Exam deferred. Pt. [place] due to COVID 19. Phone visit performed.   Assessment & Plan:   1. Type 2 diabetes mellitus with other circulatory complication, with long-term current use of insulin ([place])   2. Sinobronchitis     Meds ordered this encounter  Medications  . insulin aspart protamine- aspart (NOVOLOG MIX 70/30) (70-30) 100 UNIT/ML injection    Sig: Inject 0.2 mLs (20 Units total) into the skin 2 (two) times daily with a meal. Inject 16 units into the skin in the morning and in the evening.    Dispense:  20 mL    Refill:  11  . amoxicillin-clavulanate (AUGMENTIN) 875-125 MG tablet    Sig: Take 1 tablet by mouth 2 (two) times daily. Take all of this medication    Dispense:  20 tablet    Refill:  0    No orders of the defined types were placed in this encounter.     Diagnoses and all  orders for this visit:  Type 2 diabetes mellitus with other circulatory complication, with long-term current use of insulin (HCC)  Sinobronchitis  Other orders -     insulin aspart protamine- aspart (NOVOLOG MIX 70/30) (70-30) 100 UNIT/ML injection; Inject 0.2 mLs (20 Units total) into the skin 2 (two) times daily with a meal. Inject 16 units into the skin in the morning and in the evening. -     amoxicillin-clavulanate (AUGMENTIN) 875-125 MG tablet; Take 1 tablet by mouth 2 (two) times daily. Take all of this medication    Virtual Visit via telephone Note  I discussed the limitations, risks, security and privacy concerns of performing an evaluation and management service by  telephone and the availability of in person appointments. The patient was identified with two identifiers. Pt.expressed understanding and agreed to proceed. Pt. Is at home. Dr. Livia Snellen is in his office.  Follow Up Instructions:   I discussed the assessment and treatment plan with the patient. The patient was provided an opportunity to ask questions and all were answered. The patient agreed with the plan and demonstrated an understanding of the instructions.   The patient was advised to call back or seek an in-person evaluation if the symptoms worsen or if the condition fails to improve as anticipated.   Total minutes including chart review and phone contact time: 24   Follow up plan: Return in about 6 months (around 09/24/2019).  Claretta Fraise, MD [place]

## 2019-03-28 ENCOUNTER — Ambulatory Visit: Payer: Medicare Other | Admitting: Family Medicine

## 2019-03-28 ENCOUNTER — Other Ambulatory Visit: Payer: Self-pay | Admitting: *Deleted

## 2019-03-28 DIAGNOSIS — Z20828 Contact with and (suspected) exposure to other viral communicable diseases: Secondary | ICD-10-CM | POA: Diagnosis not present

## 2019-03-28 DIAGNOSIS — Z20822 Contact with and (suspected) exposure to covid-19: Secondary | ICD-10-CM

## 2019-03-29 LAB — NOVEL CORONAVIRUS, NAA: SARS-CoV-2, NAA: NOT DETECTED

## 2019-03-30 ENCOUNTER — Encounter: Payer: Self-pay | Admitting: Family Medicine

## 2019-04-09 ENCOUNTER — Telehealth: Payer: Self-pay | Admitting: Family Medicine

## 2019-04-09 NOTE — Chronic Care Management (AMB) (Signed)
Chronic Care Management   Note  04/09/2019 Name: Edward Heath MRN: [id number] DOB: September 08, 1945  [name] is a 73 y.o. year old male who is a primary care patient of Stacks, Cletus Gash, MD. I reached out to Alvina Chou by phone today in response to a referral sent by [name]'s health plan.     [name] was given information about Chronic Care Management services today including:  1. CCM service includes personalized support from designated clinical staff supervised by his physician, including individualized plan of care and coordination with other care providers 2. 24/7 contact phone numbers for assistance for urgent and routine care needs. 3. Service will only be billed when office clinical staff spend 20 minutes or more in a month to coordinate care. 4. Only one practitioner may furnish and bill the service in a calendar month. 5. The patient may stop CCM services at any time (effective at the end of the month) by phone call to the office staff. 6. The patient will be responsible for cost sharing (co-pay) of up to 20% of the service fee (after annual deductible is met).  Patient did not agree to enrollment in care management services and does not wish to consider at this time.  Follow up plan: The patient has been provided with contact information for the chronic care management team and has been advised to call with any health related questions or concerns.   [place], [place] [address] Direct Dial: [email or web address]  Website: [email or web address]

## 2019-06-24 DIAGNOSIS — R200 Anesthesia of skin: Secondary | ICD-10-CM | POA: Insufficient documentation

## 2019-06-24 DIAGNOSIS — I63019 Cerebral infarction due to thrombosis of unspecified vertebral artery: Secondary | ICD-10-CM | POA: Diagnosis not present

## 2019-06-24 DIAGNOSIS — R202 Paresthesia of skin: Secondary | ICD-10-CM | POA: Insufficient documentation

## 2019-06-24 DIAGNOSIS — E1140 Type 2 diabetes mellitus with diabetic neuropathy, unspecified: Secondary | ICD-10-CM | POA: Diagnosis not present

## 2019-06-24 DIAGNOSIS — G5622 Lesion of ulnar nerve, left upper limb: Secondary | ICD-10-CM | POA: Insufficient documentation

## 2019-07-01 DIAGNOSIS — Z23 Encounter for immunization: Secondary | ICD-10-CM | POA: Diagnosis not present

## 2019-07-03 ENCOUNTER — Ambulatory Visit (INDEPENDENT_AMBULATORY_CARE_PROVIDER_SITE_OTHER): Payer: Medicare Other | Admitting: Orthopaedic Surgery

## 2019-07-03 ENCOUNTER — Encounter: Payer: Self-pay | Admitting: Orthopaedic Surgery

## 2019-07-03 DIAGNOSIS — G5622 Lesion of ulnar nerve, left upper limb: Secondary | ICD-10-CM

## 2019-07-03 NOTE — Progress Notes (Signed)
Office Visit Note   Patient: Edward Heath           Date of Birth: 04-Feb-1946           MRN: [id number] Visit Date: 07/03/2019              Requested by: Claretta Fraise, MD [place],  [place] [address] PCP: Claretta Fraise, MD   Assessment & Plan: Visit Diagnoses:  1. Cubital tunnel syndrome on left     Plan: Patient has mild cubitus valgus right and left with no history of previous trauma.  Cubital tunnel syndrome on the left symptomatic.  He has noticed decreased grip strength and states he like to proceed with ulnar decompression surgery which would be done as an outpatient.  He need to see cardiology possibly have a repeat echo test to evaluate his cardiac ejection fraction.  His elbow surgery could be done as an outpatient if he is cleared by cardiology.  Pathophysiology discussed patient understands would like to proceed so that he can play golf in the spring.  Included with the patient today is the results of the 06/24/2019 EMGs nerve conduction velocities that showed pulmonary compression at the elbow and median nerve compression at the wrist.  Currently the carpal tunnel syndrome not symptomatic and I recommend not doing the carpal tunnel release since currently is having no symptoms carpal tunnel syndrome.  Follow-Up Instructions: No follow-ups on file.   Orders:  No orders of the defined types were placed in this encounter.  No orders of the defined types were placed in this encounter.     Procedures: No procedures performed   Clinical Data: No additional findings.   Subjective: Chief Complaint  Patient presents with  . Left Arm - Numbness    HPI 74 year old male long-term patient of mine referred by Dr. 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  Patient is also had median neuropathy at the wrist but is not symptomatic in the radial 3 fingers.  No problems with the  opposite right arm or hand.  His gabapentin has been increased to 10 to help his feet but really has not done anything for his elbow.  He states he drops objects and is interested in getting this improved and is concerned if he gets worse he is known to me will play golf in the spring.  Patient denies any associated neck pain.  He does have atrial fibrillation and also diabetes he is on Eliquis and also insulin.  Records review showed that in 2019 he had decreased ejection fraction at 25%.  Patient states he is followed by the [facility]ologist at the [facility].  Review of Systems positive for atrial fib previous T6 vertebral fracture.  Carotid stenosis.  Eliquis long-term anticoagulation.  Diabetes cardiomyopathy with past history of heart failure not currently symptomatic.   Objective: Vital Signs: BP (!) 113/48   Pulse 70   Ht 5\' 10"  (1.778 m)   Wt 265 lb (120.2 kg)   BMI 38.02 kg/m   Physical Exam Constitutional:      Appearance: He is well-developed.  HENT:     Head: Normocephalic and atraumatic.  Eyes:     Pupils: Pupils are equal, round, and reactive to light.  Neck:     Thyroid: No thyromegaly.     Trachea: No tracheal deviation.  Cardiovascular:     Rate and Rhythm: Normal rate.  Pulmonary:     Effort: Pulmonary effort is normal.     Breath sounds: No wheezing.  Abdominal:     General: Bowel sounds are normal.     Palpations: Abdomen is soft.  Skin:    General: Skin is warm and dry.     Capillary Refill: Capillary refill takes less than 2 seconds.  Neurological:     Mental Status: He is alert and oriented to person, place, and time.  Psychiatric:        Behavior: Behavior normal.        Thought Content: Thought content normal.        Judgment: Judgment normal.     Ortho Exam patient is mild tenderness over the ulnar nerve cubital tunnel positive percussion test.  Minimal brachial plexus tenderness negative Spurling good forward flexion the neck in extension no  pain with cervical compression.  Mildly positive carpal compression test negative Phalen's.  Sensation in the radial 3 and half fingers are normal decreased sensation ulnar one half.  First dorsal compartment interosseous weakness as well as abductor.  No atrophy noted.  No ulnar nerve subluxation noted.  Good biceps triceps strength.  2+ triceps right and left.  No interosseous weakness on the opposite right hand.  Specialty Comments:  No specialty comments available.  Imaging: No results found.   PMFS History: Patient Active Problem List   Diagnosis Date Noted  . Cubital tunnel syndrome on left 07/03/2019  . Absolute anemia 03/05/2018  . S/P ICD (internal cardiac defibrillator) procedure 01/17/2018  . Type 2 diabetes mellitus ([place]) 12/14/2015  . Cardiomyopathy ([place]) 12/14/2015  . Hyperlipidemia 12/14/2015  . 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 Congestive heart failure ([place]) 10/09/2013  . NSTEMI, initial episode of care ([place]) 10/09/2013  . Action tremor 06/04/2013  . Personal history of other malignant neoplasm of skin 11/14/2012  . Ankylosis of spine 04/20/2011  . Fracture of T6 vertebra ([place]) 04/20/2011  . Hiatal hernia 09/02/2010  . Barrett's esophagus 09/02/2010  . Carotid stenosis, bilateral 09/02/2010  . Hypertension 08/31/2010  . CAD (coronary artery disease) 08/31/2010  . PAD (peripheral artery disease) ([place]) 08/31/2010  . DDD (degenerative disc disease), lumbar 08/31/2010  . Chronic low back pain 08/31/2010  . Actinic keratosis 08/31/2010  . Melanoma ([place]) 08/31/2010  . Anterior circulation transient ischemic attack 05/22/2009  . Stroke [facility]) 10/25/2005   Past Medical History:  Diagnosis Date  . CHF (congestive heart failure) ([place])   . Diabetes mellitus without complication ([place])   . GERD (gastroesophageal reflux disease)   . Heart attack ([place]) 2004, 2015  . Hyperlipidemia   . Hypertension   . Lumbar vertebral fracture ([place]) 2012  .  Skin cancer 06/2014   left cheek  . Stroke ([place])   . Thyroid disease   . TIA (transient ischemic attack) 2009    Family History  Problem Relation Age of Onset  . Heart attack Mother   . Aneurysm Father   . Heart attack Brother 35    Past Surgical History:  Procedure Laterality Date  . CARDIAC DEFIBRILLATOR PLACEMENT  12/2003, Repaired in 2012  . KNEE SURGERY Right 1994  . SKIN CANCER EXCISION  06/2014   Social History   Occupational History  . Not on file  Tobacco Use  . Smoking status: Never Smoker  . Smokeless tobacco: Current User    Types: Chew  Substance and Sexual Activity  . Alcohol use: Yes    Comment: occ  .  Drug use: No  . Sexual activity: Yes

## 2019-07-16 DIAGNOSIS — L821 Other seborrheic keratosis: Secondary | ICD-10-CM | POA: Insufficient documentation

## 2019-07-16 DIAGNOSIS — D485 Neoplasm of uncertain behavior of skin: Secondary | ICD-10-CM | POA: Diagnosis not present

## 2019-07-16 DIAGNOSIS — L570 Actinic keratosis: Secondary | ICD-10-CM | POA: Diagnosis not present

## 2019-07-16 DIAGNOSIS — C44229 Squamous cell carcinoma of skin of left ear and external auricular canal: Secondary | ICD-10-CM | POA: Diagnosis not present

## 2019-07-16 DIAGNOSIS — C44222 Squamous cell carcinoma of skin of right ear and external auricular canal: Secondary | ICD-10-CM | POA: Diagnosis not present

## 2019-07-16 DIAGNOSIS — Z85828 Personal history of other malignant neoplasm of skin: Secondary | ICD-10-CM | POA: Diagnosis not present

## 2019-07-20 IMAGING — DX RIGHT HAND - COMPLETE 3+ VIEW
3 series · 3 of 3 positions shown · non-contrast
Comparison: Radiograph 07/05/2016

CLINICAL DATA: Right hand pain after fall yesterday. Pain and
swelling.

EXAM:
RIGHT HAND - COMPLETE 3+ VIEW

[hand pa]
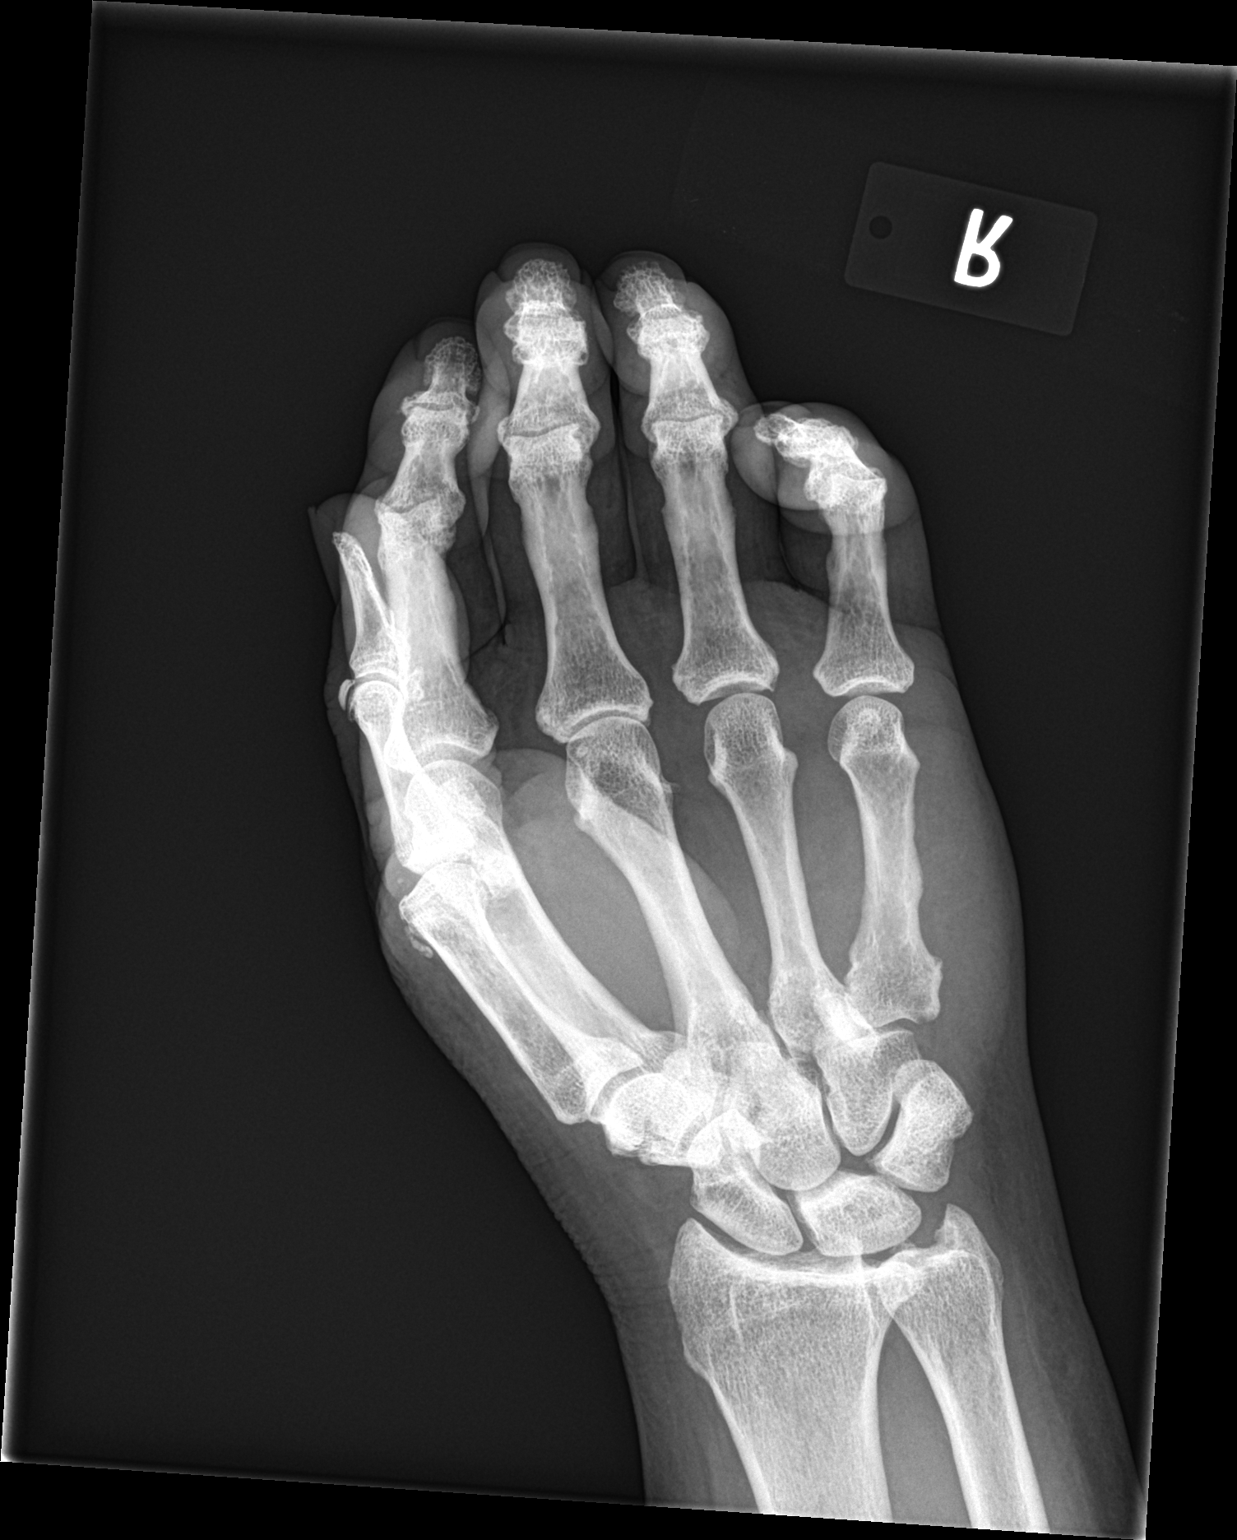

[hand obl]
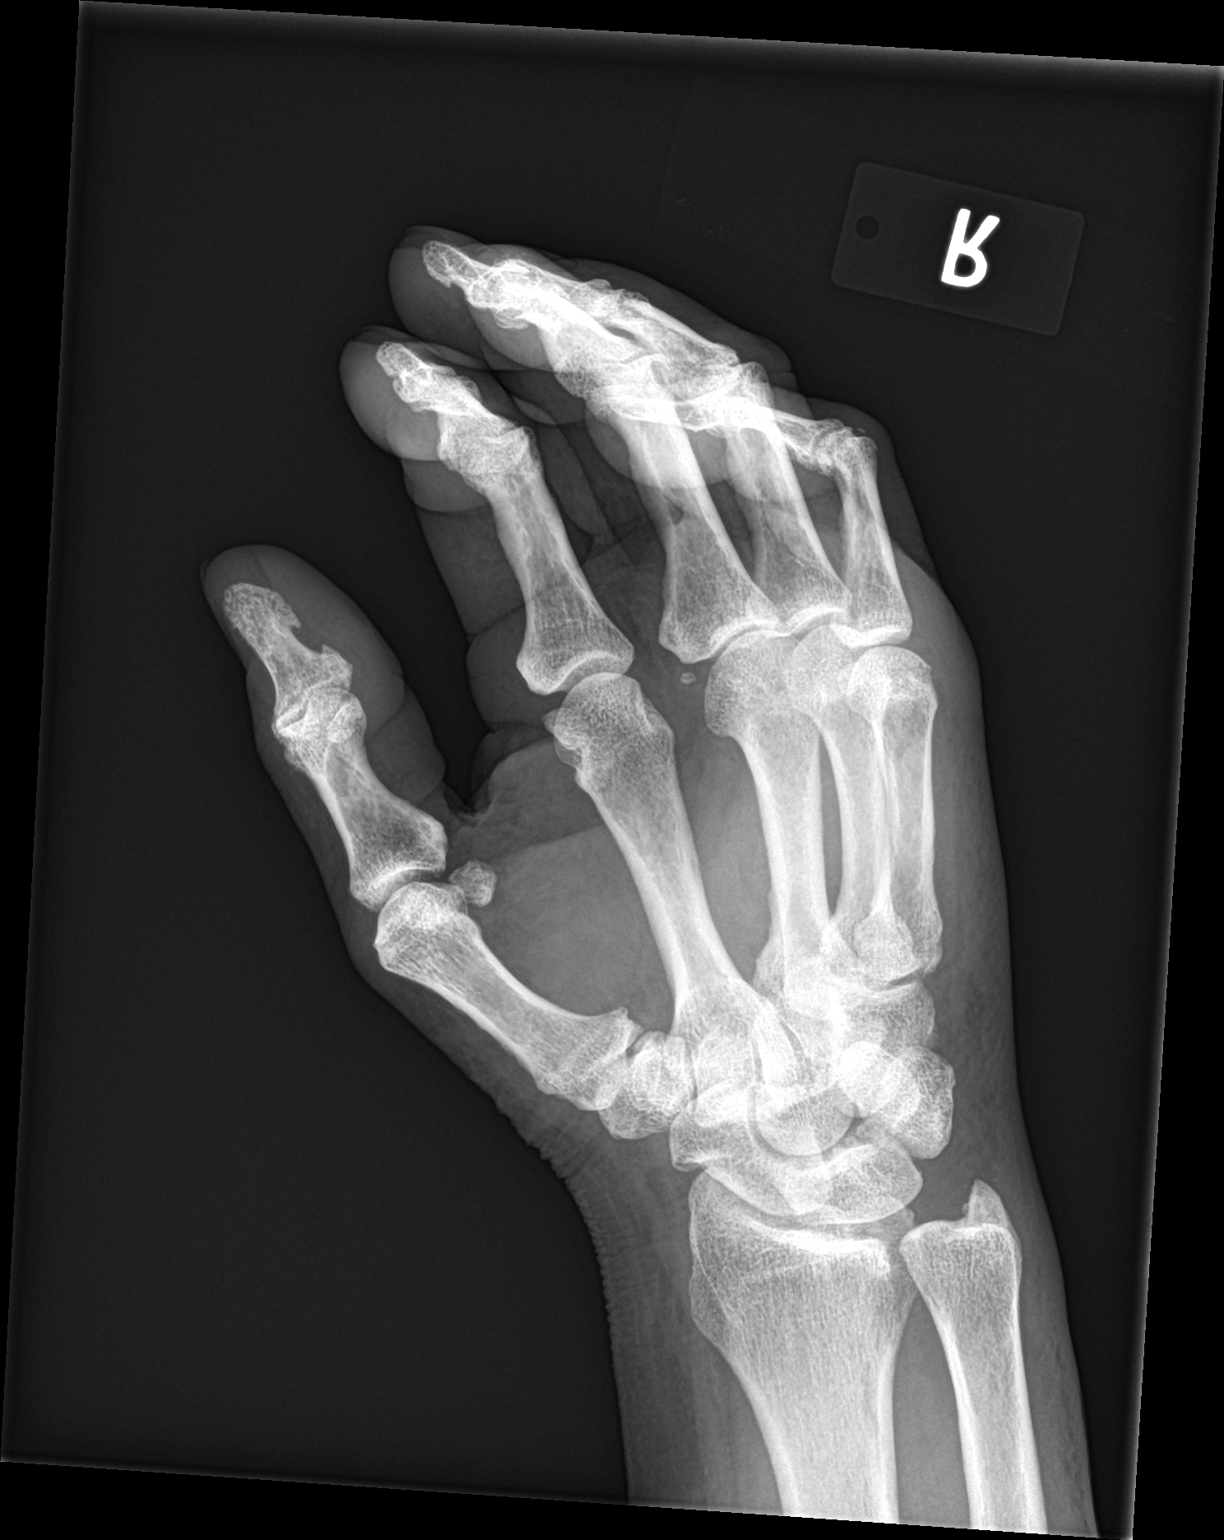

[hand lat]
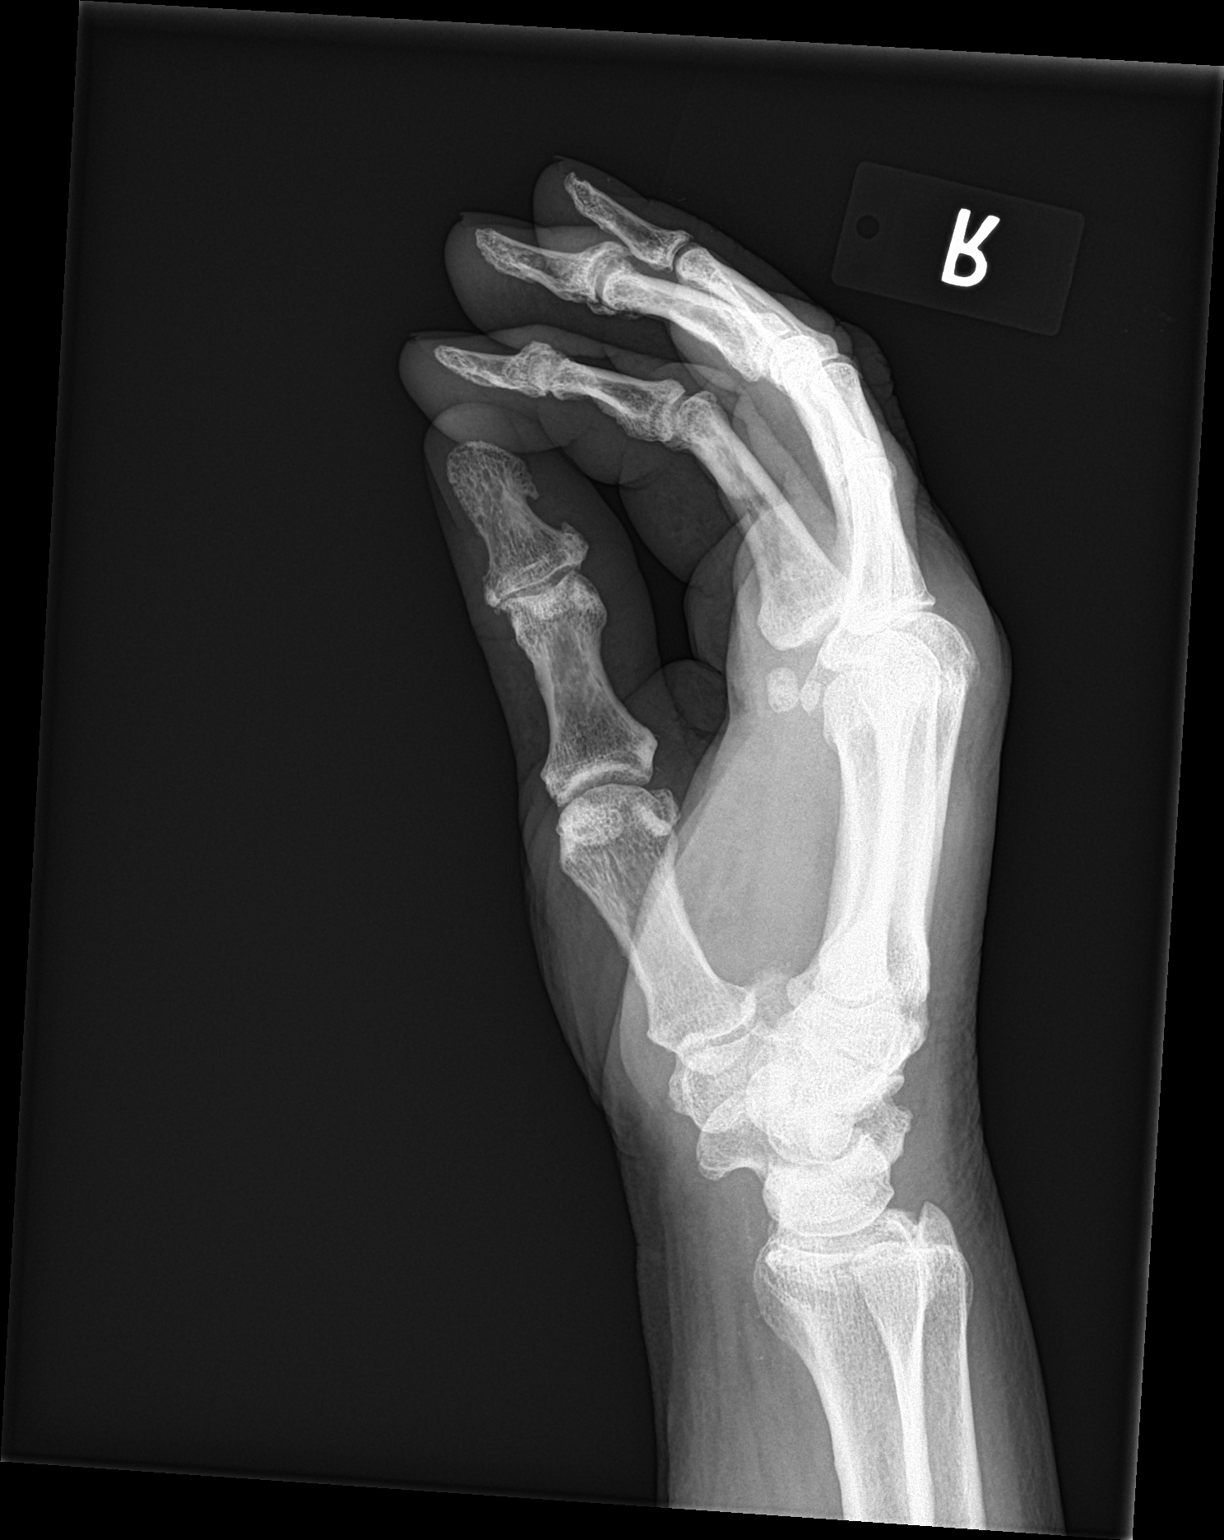

[3 of 3 positions shown; findings below may reference images not displayed]

FINDINGS: There is no evidence of fracture or dislocation. Moderate
osteoarthritis throughout the digits. Mild osteoarthritis about the
wrist. Soft tissues are unremarkable.
IMPRESSION: No fracture or subluxation of the right hand. Multifocal
osteoarthritis.

## 2019-08-18 DIAGNOSIS — C44229 Squamous cell carcinoma of skin of left ear and external auricular canal: Secondary | ICD-10-CM | POA: Diagnosis not present
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
## 2019-09-02 NOTE — Progress Notes (Signed)
[place], [place] [address] [place] [address] Phone: [phone] Fax: [place], [place] [address] Phone: [phone] Fax: [phone]    Your procedure is scheduled on Friday, April 16th.  Report to [facility] Main Entrance "A" at 9:30 A.M., and check in at the Admitting office.  Call this number if you have problems the morning of surgery:  [phone]  Call [phone] if you have any questions prior to your surgery date Monday-Friday 8am-4pm   Remember:  Do not eat after midnight the night before your surgery  You may drink clear liquids until 8:30 A.M. the morning of your surgery.   Clear liquids allowed are: Water, Non-Citrus Juices (without pulp), Carbonated Beverages, Clear Tea, Black Coffee Only, and Gatorade    Enhanced Recovery after Surgery for Orthopedics Enhanced Recovery after Surgery is a protocol used to improve the stress on your body and your recovery after surgery.  Patient Instructions  . The night before surgery:  o No food after midnight. ONLY clear liquids after midnight  . The day of surgery (if you have diabetes): o  o Drink ONE (1) 10oz bottle of water by 8:30 A.M. the morning of surgery. Please drink in one setting. DO NOT SIP. o This drink was given to you during your hospital  pre-op appointment visit.  o Nothing else to drink after completing the  ONE (1) 10oz bottle water         If you have questions, please contact your surgeon's office.   Take these medicines the morning of surgery with A SIP OF WATER  amiodarone (PACERONE)  atorvastatin (LIPITOR) gabapentin (NEURONTIN) levothyroxine (SYNTHROID, LEVOTHROID) loratadine (CLARITIN)  metoprolol succinate (TOPROL-XL) omeprazole (PRILOSEC)  Oxcarbazepine (TRILEPTAL  If needed - acetaminophen (TYLENOL), nitroGLYCERIN (NITROSTAT)   Per Cardiologist's office  instructions: - Hold apixaban (Eliquis) for 3 days prior to surgery - Continue taking aspirin 81 mg as prescribed  WHAT DO I DO ABOUT MY DIABETES MEDICATION?  The night before surgery -  Do NOT take insulin aspart (NOVOLOG FLEXPEN) - Take 25 units of insulin glargine (LANTUS)  The morning of surgery - Do NOT take metFORMIN (GLUCOPHAGE) - Do NOT take insulin aspart (NOVOLOG FLEXPEN)   . If your CBG is greater than 220 mg/dL, you may take  of your sliding scale (correction) dose of insulin.  HOW TO MANAGE YOUR DIABETES BEFORE AND AFTER SURGERY  Why is it important to control my blood sugar before and after surgery? . Improving blood sugar levels before and after surgery helps healing and can limit problems. . A way of improving blood sugar control is eating a healthy diet by: o  Eating less sugar and carbohydrates o  Increasing activity/exercise o  Talking with your doctor about reaching your blood sugar goals . High blood sugars (greater than 180 mg/dL) can raise your risk of infections and slow your recovery, so you will need to focus on controlling your diabetes during the weeks before surgery. . Make sure that the doctor who takes care of your diabetes knows about your planned surgery including the date and location.  How do I manage my blood sugar before surgery? . Check your blood sugar at least 4 times a day, starting 2 days before surgery, to make sure that the level is not too high or low. . Check your blood sugar the morning of your surgery  when you wake up and every 2 hours until you get to the Short Stay unit. o If your blood sugar is less than 70 mg/dL, you will need to treat for low blood sugar: - Do not take insulin. - Treat a low blood sugar (less than 70 mg/dL) with  cup of clear juice (cranberry or apple), 4 glucose tablets, OR glucose gel. - Recheck blood sugar in 15 minutes after treatment (to make sure it is greater than 70 mg/dL). If your blood sugar is not  greater than 70 mg/dL on recheck, call [phone] for further instructions. . Report your blood sugar to the short stay nurse when you get to Short Stay.  . If you are admitted to the hospital after surgery: o Your blood sugar will be checked by the staff and you will probably be given insulin after surgery (instead of oral diabetes medicines) to make sure you have good blood sugar levels. o The goal for blood sugar control after surgery is 80-180 mg/dL.  As of today, STOP taking Aleve, Naproxen, Ibuprofen, Motrin, Advil, Goody's, BC's, all herbal medications, fish oil, and all vitamins.             Do not wear jewelry.            Do not wear lotions, powders, colognes, or deodorant.            Men may shave face and neck.            Do not bring valuables to the hospital.            [facility] is not responsible for any belongings or valuables.  Do NOT Smoke (Tobacco/Vapping) or drink Alcohol 24 hours prior to your procedure If you use a CPAP at night, you may bring all equipment for your overnight stay.   Contacts, glasses, dentures or bridgework may not be worn into surgery.      For patients admitted to the hospital, discharge time will be determined by your treatment team.   Patients discharged the day of surgery will not be allowed to drive home, and someone needs to stay with them for 24 hours.  Special instructions:   [place]- Preparing For Surgery  Before surgery, you can play an important role. Because skin is not sterile, your skin needs to be as free of germs as possible. You can reduce the number of germs on your skin by washing with CHG (chlorahexidine gluconate) Soap before surgery.  CHG is an antiseptic cleaner which kills germs and bonds with the skin to continue killing germs even after washing.    Oral Hygiene is also important to reduce your risk of infection.  Remember - BRUSH YOUR TEETH THE MORNING OF SURGERY WITH YOUR REGULAR TOOTHPASTE  Please do not use  if you have an allergy to CHG or antibacterial soaps. If your skin becomes reddened/irritated stop using the CHG.  Do not shave (including legs and underarms) for at least 48 hours prior to first CHG shower. It is OK to shave your face.  Please follow these instructions carefully.   1. Shower the NIGHT BEFORE SURGERY and the MORNING OF SURGERY with CHG Soap.   2. If you chose to wash your hair, wash your hair first as usual with your normal shampoo.  3. After you shampoo, rinse your hair and body thoroughly to remove the shampoo.  4. Use CHG as you would any other liquid soap. You can apply CHG directly to  the skin and wash gently with a scrungie or a clean washcloth.   5. Apply the CHG Soap to your body ONLY FROM THE NECK DOWN.  Do not use on open wounds or open sores. Avoid contact with your eyes, ears, mouth and genitals (private parts). Wash Face and genitals (private parts)  with your normal soap.   6. Wash thoroughly, paying special attention to the area where your surgery will be performed.  7. Thoroughly rinse your body with warm water from the neck down.  8. DO NOT shower/wash with your normal soap after using and rinsing off the CHG Soap.  9. Pat yourself dry with a CLEAN TOWEL.  10. Wear CLEAN PAJAMAS to bed the night before surgery, wear comfortable clothes the morning of surgery  11. Place CLEAN SHEETS on your bed the night of your first shower and DO NOT SLEEP WITH PETS.  Day of Surgery: Do not apply any deodorants/lotions.  Please wear clean clothes to the hospital/surgery center.   Remember to brush your teeth WITH YOUR REGULAR TOOTHPASTE.   Please read over the following fact sheets that you were given.

## 2019-09-03 ENCOUNTER — Encounter (HOSPITAL_COMMUNITY)
Admission: RE | Admit: 2019-09-03 | Discharge: 2019-09-03 | Disposition: A | Discharge status: Home / Self Care | Payer: No Typology Code available for payment source | Source: Ambulatory Visit | Attending: Surgery | Admitting: Surgery

## 2019-09-03 ENCOUNTER — Encounter (HOSPITAL_COMMUNITY)
Admission: RE | Admit: 2019-09-03 | Discharge: 2019-09-03 | Disposition: A | Discharge status: Home / Self Care | Payer: No Typology Code available for payment source | Source: Ambulatory Visit | Attending: Orthopaedic Surgery | Admitting: Orthopaedic Surgery

## 2019-09-03 ENCOUNTER — Encounter: Payer: Self-pay | Admitting: Surgery

## 2019-09-03 ENCOUNTER — Other Ambulatory Visit (HOSPITAL_COMMUNITY)
Admission: RE | Admit: 2019-09-03 | Discharge: 2019-09-03 | Disposition: A | Discharge status: Home / Self Care | Payer: No Typology Code available for payment source | Source: Ambulatory Visit | Attending: Orthopaedic Surgery | Admitting: Orthopaedic Surgery

## 2019-09-03 ENCOUNTER — Other Ambulatory Visit: Payer: Self-pay

## 2019-09-03 ENCOUNTER — Encounter (HOSPITAL_COMMUNITY): Payer: Self-pay

## 2019-09-03 ENCOUNTER — Ambulatory Visit (INDEPENDENT_AMBULATORY_CARE_PROVIDER_SITE_OTHER): Payer: Medicare Other | Admitting: Surgery

## 2019-09-03 VITALS — BP 105/59 | HR 69 | Temp 98.2°F

## 2019-09-03 DIAGNOSIS — G5622 Lesion of ulnar nerve, left upper limb: Secondary | ICD-10-CM | POA: Diagnosis not present

## 2019-09-03 DIAGNOSIS — Z95810 Presence of automatic (implantable) cardiac defibrillator: Secondary | ICD-10-CM | POA: Insufficient documentation

## 2019-09-03 DIAGNOSIS — E119 Type 2 diabetes mellitus without complications: Secondary | ICD-10-CM | POA: Diagnosis not present

## 2019-09-03 DIAGNOSIS — Z85828 Personal history of other malignant neoplasm of skin: Secondary | ICD-10-CM | POA: Diagnosis not present

## 2019-09-03 DIAGNOSIS — Z79899 Other long term (current) drug therapy: Secondary | ICD-10-CM | POA: Diagnosis not present

## 2019-09-03 DIAGNOSIS — I509 Heart failure, unspecified: Secondary | ICD-10-CM | POA: Diagnosis not present

## 2019-09-03 DIAGNOSIS — I252 Old myocardial infarction: Secondary | ICD-10-CM | POA: Insufficient documentation

## 2019-09-03 DIAGNOSIS — Z794 Long term (current) use of insulin: Secondary | ICD-10-CM | POA: Diagnosis not present

## 2019-09-03 DIAGNOSIS — Z7982 Long term (current) use of aspirin: Secondary | ICD-10-CM | POA: Diagnosis not present

## 2019-09-03 DIAGNOSIS — E785 Hyperlipidemia, unspecified: Secondary | ICD-10-CM | POA: Insufficient documentation

## 2019-09-03 DIAGNOSIS — I2510 Atherosclerotic heart disease of native coronary artery without angina pectoris: Secondary | ICD-10-CM | POA: Diagnosis not present

## 2019-09-03 DIAGNOSIS — Z01818 Encounter for other preprocedural examination: Secondary | ICD-10-CM

## 2019-09-03 DIAGNOSIS — E039 Hypothyroidism, unspecified: Secondary | ICD-10-CM | POA: Diagnosis not present

## 2019-09-03 DIAGNOSIS — I110 Hypertensive heart disease with heart failure: Secondary | ICD-10-CM | POA: Insufficient documentation

## 2019-09-03 DIAGNOSIS — Z79890 Hormone replacement therapy (postmenopausal): Secondary | ICD-10-CM | POA: Diagnosis not present

## 2019-09-03 DIAGNOSIS — Z8673 Personal history of transient ischemic attack (TIA), and cerebral infarction without residual deficits: Secondary | ICD-10-CM | POA: Insufficient documentation

## 2019-09-03 DIAGNOSIS — Z7901 Long term (current) use of anticoagulants: Secondary | ICD-10-CM | POA: Insufficient documentation

## 2019-09-03 DIAGNOSIS — Z20822 Contact with and (suspected) exposure to covid-19: Secondary | ICD-10-CM | POA: Diagnosis not present

## 2019-09-03 DIAGNOSIS — K219 Gastro-esophageal reflux disease without esophagitis: Secondary | ICD-10-CM | POA: Insufficient documentation

## 2019-09-03 DIAGNOSIS — Z955 Presence of coronary angioplasty implant and graft: Secondary | ICD-10-CM | POA: Insufficient documentation

## 2019-09-03 HISTORY — DX: Personal history of urinary calculi: Z87.442

## 2019-09-03 HISTORY — DX: Unspecified osteoarthritis, unspecified site: M19.90

## 2019-09-03 HISTORY — DX: Presence of automatic (implantable) cardiac defibrillator: Z95.810

## 2019-09-03 HISTORY — DX: Atherosclerotic heart disease of native coronary artery without angina pectoris: I25.10

## 2019-09-03 HISTORY — DX: Cardiac arrhythmia, unspecified: I49.9

## 2019-09-03 HISTORY — DX: Hypothyroidism, unspecified: E03.9

## 2019-09-03 LAB — CBC
HCT: 40.7 % (ref 39.0–52.0)
Hemoglobin: 12.9 g/dL — ABNORMAL LOW (ref 13.0–17.0)
MCH: 29.5 pg (ref 26.0–34.0)
MCHC: 31.7 g/dL (ref 30.0–36.0)
MCV: 92.9 fL (ref 80.0–100.0)
Platelets: 253 10*3/uL (ref 150–400)
RBC: 4.38 MIL/uL (ref 4.22–5.81)
RDW: 13.6 % (ref 11.5–15.5)
WBC: 9.2 10*3/uL (ref 4.0–10.5)
nRBC: 0 % (ref 0.0–0.2)

## 2019-09-03 LAB — COMPREHENSIVE METABOLIC PANEL
ALT: 27 U/L (ref 0–44)
AST: 35 U/L (ref 15–41)
Albumin: 3.6 g/dL (ref 3.5–5.0)
Alkaline Phosphatase: 68 U/L (ref 38–126)
Anion gap: 13 (ref 5–15)
BUN: 13 mg/dL (ref 8–23)
CO2: 27 mmol/L (ref 22–32)
Calcium: 9.1 mg/dL (ref 8.9–10.3)
Chloride: 98 mmol/L (ref 98–111)
Creatinine, Ser: 1.39 mg/dL — ABNORMAL HIGH (ref 0.61–1.24)
GFR calc Af Amer: 57 mL/min — ABNORMAL LOW (ref >60)
GFR calc non Af Amer: 50 mL/min — ABNORMAL LOW (ref >60)
Glucose, Bld: 201 mg/dL — ABNORMAL HIGH (ref 70–99)
Potassium: 3.7 mmol/L (ref 3.5–5.1)
Sodium: 138 mmol/L (ref 135–145)
Total Bilirubin: 0.8 mg/dL (ref 0.3–1.2)
Total Protein: 7 g/dL (ref 6.5–8.1)

## 2019-09-03 LAB — SARS CORONAVIRUS 2 (TAT 6-24 HRS): SARS Coronavirus 2: NEGATIVE

## 2019-09-03 LAB — HEMOGLOBIN A1C
Hgb A1c MFr Bld: 8.3 % — ABNORMAL HIGH (ref 4.8–5.6)
Mean Plasma Glucose: 191.51 mg/dL

## 2019-09-03 LAB — GLUCOSE, CAPILLARY: Glucose-Capillary: 224 mg/dL — ABNORMAL HIGH (ref 70–99)

## 2019-09-03 LAB — SURGICAL PCR SCREEN
MRSA, PCR: NEGATIVE
Staphylococcus aureus: NEGATIVE

## 2019-09-03 NOTE — Progress Notes (Signed)
[place], [place] [address] [place] [address] Phone: [phone] Fax: [place], [place] [address] Phone: [phone] Fax: [phone]    Your procedure is scheduled on Friday, April 16th.  Report to [facility] Main Entrance "A" at 9:30 A.M., and check in at the Admitting office.  Call this number if you have problems the morning of surgery:  [phone]  Call [phone] if you have any questions prior to your surgery date Monday-Friday 8am-4pm   Remember:  Do not eat after midnight the night before your surgery  You may drink clear liquids until 8:30 A.M. the morning of your surgery.   Clear liquids allowed are: Water, Non-Citrus Juices (without pulp), Carbonated Beverages, Clear Tea, Black Coffee Only, and Gatorade    Enhanced Recovery after Surgery for Orthopedics Enhanced Recovery after Surgery is a protocol used to improve the stress on your body and your recovery after surgery.  Patient Instructions  . The night before surgery:  o No food after midnight. ONLY clear liquids after midnight  . The day of surgery (if you have diabetes): o  o Drink ONE (1) 10oz bottle of water by 8:30 A.M. the morning of surgery. Please drink in one setting. DO NOT SIP. o This drink was given to you during your hospital  pre-op appointment visit.  o Nothing else to drink after completing the  ONE (1) 10oz bottle water         If you have questions, please contact your surgeon's office.   Take these medicines the morning of surgery with A SIP OF WATER  amiodarone (PACERONE)  atorvastatin (LIPITOR) gabapentin (NEURONTIN) levothyroxine (SYNTHROID, LEVOTHROID) loratadine (CLARITIN)  metoprolol succinate (TOPROL-XL) omeprazole (PRILOSEC)  Oxcarbazepine (TRILEPTAL  If needed - acetaminophen (TYLENOL), nitroGLYCERIN (NITROSTAT)   Per Cardiologist's office  instructions: - Hold apixaban (Eliquis) for 3 days prior to surgery - Continue taking aspirin 81 mg as prescribed  WHAT DO I DO ABOUT MY DIABETES MEDICATION?  The night before surgery -  Do NOT take insulin aspart (NOVOLOG FLEXPEN) - Take 25 units of insulin glargine (LANTUS)  The morning of surgery - Do NOT take metFORMIN (GLUCOPHAGE) - Do NOT take insulin aspart (NOVOLOG FLEXPEN)   . If your CBG is greater than 220 mg/dL, you may take  of your sliding scale (correction) dose of insulin.  HOW TO MANAGE YOUR DIABETES BEFORE AND AFTER SURGERY  Why is it important to control my blood sugar before and after surgery? . Improving blood sugar levels before and after surgery helps healing and can limit problems. . A way of improving blood sugar control is eating a healthy diet by: o  Eating less sugar and carbohydrates o  Increasing activity/exercise o  Talking with your doctor about reaching your blood sugar goals . High blood sugars (greater than 180 mg/dL) can raise your risk of infections and slow your recovery, so you will need to focus on controlling your diabetes during the weeks before surgery. . Make sure that the doctor who takes care of your diabetes knows about your planned surgery including the date and location.  How do I manage my blood sugar before surgery? . Check your blood sugar at least 4 times a day, starting 2 days before surgery, to make sure that the level is not too high or low. . Check your blood sugar the morning of your surgery  when you wake up and every 2 hours until you get to the Short Stay unit. o If your blood sugar is less than 70 mg/dL, you will need to treat for low blood sugar: - Do not take insulin. - Treat a low blood sugar (less than 70 mg/dL) with  cup of clear juice (cranberry or apple), 4 glucose tablets, OR glucose gel. - Recheck blood sugar in 15 minutes after treatment (to make sure it is greater than 70 mg/dL). If your blood sugar is not  greater than 70 mg/dL on recheck, call [phone] for further instructions. . Report your blood sugar to the short stay nurse when you get to Short Stay.  . If you are admitted to the hospital after surgery: o Your blood sugar will be checked by the staff and you will probably be given insulin after surgery (instead of oral diabetes medicines) to make sure you have good blood sugar levels. o The goal for blood sugar control after surgery is 80-180 mg/dL.  As of today, STOP taking Aleve, Naproxen, Ibuprofen, Motrin, Advil, Goody's, BC's, all herbal medications, fish oil, and all vitamins.             Do not wear jewelry.            Do not wear lotions, powders, colognes, or deodorant.            Men may shave face and neck.            Do not bring valuables to the hospital.            [facility] is not responsible for any belongings or valuables.  Do NOT Smoke (Tobacco/Vapping) or drink Alcohol 24 hours prior to your procedure If you use a CPAP at night, you may bring all equipment for your overnight stay.   Contacts, glasses, dentures or bridgework may not be worn into surgery.      For patients admitted to the hospital, discharge time will be determined by your treatment team.   Patients discharged the day of surgery will not be allowed to drive home, and someone needs to stay with them for 24 hours.  Special instructions:   [place]- Preparing For Surgery  Before surgery, you can play an important role. Because skin is not sterile, your skin needs to be as free of germs as possible. You can reduce the number of germs on your skin by washing with CHG (chlorahexidine gluconate) Soap before surgery.  CHG is an antiseptic cleaner which kills germs and bonds with the skin to continue killing germs even after washing.    Oral Hygiene is also important to reduce your risk of infection.  Remember - BRUSH YOUR TEETH THE MORNING OF SURGERY WITH YOUR REGULAR TOOTHPASTE  Please do not use  if you have an allergy to CHG or antibacterial soaps. If your skin becomes reddened/irritated stop using the CHG.  Do not shave (including legs and underarms) for at least 48 hours prior to first CHG shower. It is OK to shave your face.  Please follow these instructions carefully.   1. Shower the NIGHT BEFORE SURGERY and the MORNING OF SURGERY with CHG Soap.   2. If you chose to wash your hair, wash your hair first as usual with your normal shampoo.  3. After you shampoo, rinse your hair and body thoroughly to remove the shampoo.  4. Use CHG as you would any other liquid soap. You can apply CHG directly to  the skin and wash gently with a scrungie or a clean washcloth.   5. Apply the CHG Soap to your body ONLY FROM THE NECK DOWN.  Do not use on open wounds or open sores. Avoid contact with your eyes, ears, mouth and genitals (private parts). Wash Face and genitals (private parts)  with your normal soap.   6. Wash thoroughly, paying special attention to the area where your surgery will be performed.  7. Thoroughly rinse your body with warm water from the neck down.  8. DO NOT shower/wash with your normal soap after using and rinsing off the CHG Soap.  9. Pat yourself dry with a CLEAN TOWEL.  10. Wear CLEAN PAJAMAS to bed the night before surgery, wear comfortable clothes the morning of surgery  11. Place CLEAN SHEETS on your bed the night of your first shower and DO NOT SLEEP WITH PETS.  Day of Surgery: Do not apply any deodorants/lotions.  Please wear clean clothes to the hospital/surgery center.   Remember to brush your teeth WITH YOUR REGULAR TOOTHPASTE.   Please read over the following fact sheets that you were given.

## 2019-09-03 NOTE — Progress Notes (Signed)
PCP - Dr. Cassell Clement @ [facility] and Dr. Penni Bombard @ [facility] Cardiologist - Edward Hansen Fulp PA Thayer Heber  PPM/ICD - yes Device Orders - tes Rep Notified - send email to [facility] @ [employer]  Chest x-ray - 09/03/19 EKG - request Stress Test - 2/17 ECHO - 8/20 Cardiac Cath - 2015  Sleep Study - na CPAP -   Fasting Blood Sugar - 118-130 Checks Blood Sugar ____3_ times a day  Blood Thinner Instructions:stopped eliquis 4-13/21 Aspirin Instructions:continue  ERAS Protcol -yes PRE-SURGERY water  COVID TEST- 09/03/19   Anesthesia review: cardiac hx./ ICD  Patient denies shortness of breath, fever, cough and chest pain at PAT appointment   All instructions explained to the patient, with a verbal understanding of the material. Patient agrees to go over the instructions while at home for a better understanding. Patient also instructed to self quarantine after being tested for COVID-19. The opportunity to ask questions was provided.

## 2019-09-04 ENCOUNTER — Encounter: Payer: Self-pay | Admitting: Surgery

## 2019-09-04 MED ORDER — DEXTROSE 5 % IV SOLN
3.0000 g | INTRAVENOUS | Status: AC
  Administered 2019-09-05: 13:00:00 3 g via INTRAVENOUS
  Filled 2019-09-04: qty 3

## 2019-09-04 NOTE — Anesthesia Preprocedure Evaluation (Addendum)
Anesthesia Evaluation  Patient identified by MRN, date of birth, ID band Patient awake    Reviewed: Allergy & Precautions, NPO status , Patient's Chart, lab work & pertinent test results  Airway Mallampati: II  TM Distance: >3 FB Neck ROM: Full    Dental  (+) Edentulous Upper, Edentulous Lower   Pulmonary neg pulmonary ROS,    Pulmonary exam normal breath sounds clear to auscultation       Cardiovascular hypertension, + CAD, + Past MI, + Cardiac Stents, + Peripheral Vascular Disease and +CHF  Normal cardiovascular exam+ Cardiac Defibrillator  Rhythm:Regular Rate:Normal     Neuro/Psych TIA Neuromuscular disease CVA negative psych ROS   GI/Hepatic Neg liver ROS, GERD  ,  Endo/Other  diabetesHypothyroidism   Renal/GU negative Renal ROS  negative genitourinary   Musculoskeletal negative musculoskeletal ROS (+)   Abdominal (+) + obese,   Peds negative pediatric ROS (+)  Hematology negative hematology ROS (+)   Anesthesia Other Findings   Reproductive/Obstetrics negative OB ROS                           Anesthesia Physical Anesthesia Plan  ASA: IV  Anesthesia Plan: MAC and Regional   Post-op Pain Management:    Induction: Intravenous  PONV Risk Score and Plan: 1 and Ondansetron and Treatment may vary due to age or medical condition  Airway Management Planned: Simple Face Mask  Additional Equipment:   Intra-op Plan:   Post-operative Plan:   Informed Consent: I have reviewed the patients History and Physical, chart, labs and discussed the procedure including the risks, benefits and alternatives for the proposed anesthesia with the patient or authorized representative who has indicated his/her understanding and acceptance.     Dental advisory given  Plan Discussed with: CRNA  Anesthesia Plan Comments: (See PAT note written 09/04/2019 by Myra Gianotti, PA-C. History includes  never smoker, HTN, DM2, CVA, CAD (anterior MI 2004, s/p DES pLAD; NSTEMI 2015, s/p DES LPDA 10/08/13), CHF (EF 20-25%), ICD (ICD 01/11/04; s/p lead extraction complicated by RA/SVC tear requiring emergency sternotomy/cardiopulmonary bypass for repair with bovine pericardium, placement of LV epicardial lead 07/20/10, placement Bi-V ICD 09/15/10 on the right; s/p replacement [employer] DDD BiV ICD 08/28/17, [facility]).   Cardiology preoperative risk assessment is outlined in 07/09/19 letter by Filbert Berthold, PA-C with [facility]. He writes, "..On examination on 07/09/2019, he appears stable.  Most recent echocardiogram completed 01/08/2019 shows continued severely reduced LVEF of 20%, which is remained stable.  He remains clinically stable regarding his heart issues.  He will be at relatively high risk for any surgical procedure.  Using the Revised Cardiac Risk Index for Preoperative Risk calculator he has 4 out of a maximum of 6 points (CHF, CVA, CAD, and preoperative insulin use). However he is currently optimized for surgery.  He is cleared to proceed with surgery without further preoperative testing..." Permission to hold Eliquis for 3 days given without need for bridging. He recommended continuation of ASA 81 mg.   Perioperative device form and EKG not received from [facility] Cardiology as of 09/04/19 5:30 PM. )      Anesthesia Quick Evaluation

## 2019-09-04 NOTE — H&P (Signed)
Edward Heath is an 74 y.o. male.   Chief Complaint: Left upper extremity neuropathy HPI: 74 year old white male history of left cubital tunnel syndrome comes in for preop evaluation.  States that symptoms unchanged from previous visit.  He is wanting to proceed with left elbow ulnar nerve decompression as scheduled.  We received preop clearance.  Past Medical History:  Diagnosis Date  . AICD (automatic cardioverter/defibrillator) present   . Arthritis   . CHF (congestive heart failure) ([place])   . Coronary artery disease   . Diabetes mellitus without complication ([place])   . Dysrhythmia   . GERD (gastroesophageal reflux disease)   . Heart attack ([place]) 2004, 2015  . History of kidney stones   . Hyperlipidemia   . Hypertension   . Hypothyroidism   . Lumbar vertebral fracture ([place]) 2012  . Skin cancer 06/2014   left cheek  . Stroke ([place])    x3  . Thyroid disease   . TIA (transient ischemic attack) 2009    Past Surgical History:  Procedure Laterality Date  . CARDIAC DEFIBRILLATOR PLACEMENT  12/2003, Repaired in 2012   x3  . KNEE SURGERY Right 1994  . SKIN CANCER EXCISION  06/2014   had 4 removed    Family History  Problem Relation Age of Onset  . Heart attack Mother   . Aneurysm Father   . Heart attack Brother 69   Social History:  reports that he has never smoked. His smokeless tobacco use includes chew. He reports current alcohol use. He reports that he does not use drugs.  Allergies: No Known Allergies  No medications prior to admission.    Results for orders placed or performed during the hospital encounter of 09/03/19 (from the past 48 hour(s))  SARS CORONAVIRUS 2 (TAT 6-24 HRS) Nasopharyngeal Nasopharyngeal Swab     Status: None   Collection Time: 09/03/19 11:51 AM   Specimen: Nasopharyngeal Swab  Result Value Ref Range   SARS Coronavirus 2 NEGATIVE NEGATIVE    Comment: (NOTE) SARS-CoV-2 target nucleic acids are NOT DETECTED. The SARS-CoV-2 RNA is generally  detectable in upper and lower respiratory specimens during the acute phase of infection. Negative results do not preclude SARS-CoV-2 infection, do not rule out co-infections with other pathogens, and should not be used as the sole basis for treatment or other patient management decisions. Negative results must be combined with clinical observations, patient history, and epidemiological information. The expected result is Negative. Fact Sheet for Patients: SugarRoll.be Fact Sheet for Healthcare Providers: https://www.woods-mathews.com/ This test is not yet approved or cleared by the [place] FDA and  has been authorized for detection and/or diagnosis of SARS-CoV-2 by FDA under an Emergency Use Authorization (EUA). This EUA will remain  in effect (meaning this test can be used) for the duration of the COVID-19 declaration under Section 56 4(b)(1) of the Act, 21 U.S.C. section 360bbb-3(b)(1), unless the authorization is terminated or revoked sooner. Performed at [facility], [place] [address], [place], [place] [address]    DG Chest 2 View  Result Date: 09/03/2019 CLINICAL DATA:  Preoperative exam for elbow surgery. EXAM: CHEST - 2 VIEW COMPARISON:  08/07/2016 FINDINGS: Previous median sternotomy. Pacemaker/AICD. Mild cardiomegaly. Aortic atherosclerosis. The lungs are clear. No edema or effusions. Mild scarring. Ordinary degenerative changes affect the spine. IMPRESSION: No active disease. Previous median sternotomy. Pacemaker/AICD. Mild pulmonary scarring. Aortic atherosclerosis. Electronically Signed   By: Nelson Chimes M.D.   On: 09/03/2019 16:44    Review of  Systems  Constitutional: Negative.   HENT: Negative.   Respiratory: Negative.   Cardiovascular: Negative.   Genitourinary: Negative.   Neurological: Negative.   Psychiatric/Behavioral: Negative.     There were no vitals taken for this visit. Physical Exam  Constitutional: He  is oriented to person, place, and time. No distress.  HENT:  Head: Normocephalic and atraumatic.  Eyes: Pupils are equal, round, and reactive to light. EOM are normal.  Respiratory: Effort normal. No respiratory distress.  GI: Soft. He exhibits no distension. There is no abdominal tenderness.  Musculoskeletal:        General: Tenderness present.     Cervical back: Normal range of motion.  Neurological: He is alert and oriented to person, place, and time.  Skin: Skin is warm and dry.  Psychiatric: He has a normal mood and affect.     Assessment/Plan Left cubital tunnel  Proceed with left elbow ulnar nerve decompression as scheduled.  Surgical procedure discussed along with potential recovery time discussed.  All questions answered.  Benjiman Core, PA-C 09/04/2019, 4:15 PM

## 2019-09-04 NOTE — Progress Notes (Signed)
74 year old white male history of left cubital tunnel syndrome comes in for preop evaluation.  States that symptoms unchanged from previous visit.  He is wanting to proceed with left elbow ulnar nerve decompression as scheduled.  We received preop clearance.  Patient on Eliquis and he was advised to discontinue this medication.  Today history physical performed.  Review of systems negative.  Surgical procedure discussed and all questions answered.

## 2019-09-05 ENCOUNTER — Ambulatory Visit (HOSPITAL_COMMUNITY): Payer: No Typology Code available for payment source | Admitting: Anesthesiology

## 2019-09-05 ENCOUNTER — Ambulatory Visit (HOSPITAL_COMMUNITY): Payer: No Typology Code available for payment source | Admitting: Vascular Surgery

## 2019-09-05 ENCOUNTER — Other Ambulatory Visit: Payer: Self-pay

## 2019-09-05 ENCOUNTER — Encounter (HOSPITAL_COMMUNITY)
Admission: RE | Disposition: A | Discharge status: Home / Self Care | Payer: Self-pay | Source: Home / Self Care | Attending: Orthopaedic Surgery

## 2019-09-05 ENCOUNTER — Encounter (HOSPITAL_COMMUNITY): Payer: Self-pay | Admitting: Orthopaedic Surgery

## 2019-09-05 ENCOUNTER — Observation Stay (HOSPITAL_COMMUNITY)
Admission: RE | Admit: 2019-09-05 | Discharge: 2019-09-05 | Disposition: A | Discharge status: Home / Self Care | Payer: No Typology Code available for payment source | Attending: Orthopaedic Surgery | Admitting: Orthopaedic Surgery

## 2019-09-05 DIAGNOSIS — Z955 Presence of coronary angioplasty implant and graft: Secondary | ICD-10-CM | POA: Diagnosis not present

## 2019-09-05 DIAGNOSIS — I252 Old myocardial infarction: Secondary | ICD-10-CM | POA: Diagnosis not present

## 2019-09-05 DIAGNOSIS — I509 Heart failure, unspecified: Secondary | ICD-10-CM | POA: Insufficient documentation

## 2019-09-05 DIAGNOSIS — E1140 Type 2 diabetes mellitus with diabetic neuropathy, unspecified: Secondary | ICD-10-CM | POA: Insufficient documentation

## 2019-09-05 DIAGNOSIS — E039 Hypothyroidism, unspecified: Secondary | ICD-10-CM | POA: Diagnosis not present

## 2019-09-05 DIAGNOSIS — Z9889 Other specified postprocedural states: Secondary | ICD-10-CM

## 2019-09-05 DIAGNOSIS — I2510 Atherosclerotic heart disease of native coronary artery without angina pectoris: Secondary | ICD-10-CM | POA: Insufficient documentation

## 2019-09-05 DIAGNOSIS — K219 Gastro-esophageal reflux disease without esophagitis: Secondary | ICD-10-CM | POA: Insufficient documentation

## 2019-09-05 DIAGNOSIS — E1151 Type 2 diabetes mellitus with diabetic peripheral angiopathy without gangrene: Secondary | ICD-10-CM | POA: Diagnosis not present

## 2019-09-05 DIAGNOSIS — Z8673 Personal history of transient ischemic attack (TIA), and cerebral infarction without residual deficits: Secondary | ICD-10-CM | POA: Diagnosis not present

## 2019-09-05 DIAGNOSIS — G5622 Lesion of ulnar nerve, left upper limb: Secondary | ICD-10-CM | POA: Diagnosis not present

## 2019-09-05 DIAGNOSIS — Z85828 Personal history of other malignant neoplasm of skin: Secondary | ICD-10-CM | POA: Diagnosis not present

## 2019-09-05 DIAGNOSIS — Z95810 Presence of automatic (implantable) cardiac defibrillator: Secondary | ICD-10-CM | POA: Diagnosis not present

## 2019-09-05 DIAGNOSIS — E785 Hyperlipidemia, unspecified: Secondary | ICD-10-CM | POA: Insufficient documentation

## 2019-09-05 DIAGNOSIS — I110 Hypertensive heart disease with heart failure: Secondary | ICD-10-CM | POA: Insufficient documentation

## 2019-09-05 HISTORY — PX: ULNAR NERVE TRANSPOSITION: SHX2595

## 2019-09-05 LAB — GLUCOSE, CAPILLARY
Glucose-Capillary: 189 mg/dL — ABNORMAL HIGH (ref 70–99)
Glucose-Capillary: 160 mg/dL — ABNORMAL HIGH (ref 70–99)
Glucose-Capillary: 155 mg/dL — ABNORMAL HIGH (ref 70–99)

## 2019-09-05 LAB — PROTIME-INR
INR: 1 (ref 0.8–1.2)
Prothrombin Time: 13.2 seconds (ref 11.4–15.2)

## 2019-09-05 SURGERY — ULNAR NERVE DECOMPRESSION/TRANSPOSITION
Anesthesia: Monitor Anesthesia Care | Site: Arm Upper | Laterality: Left

## 2019-09-05 MED ORDER — FENTANYL CITRATE (PF) 100 MCG/2ML IJ SOLN: 50.0000 ug | Freq: Once | INTRAMUSCULAR | Status: AC

## 2019-09-05 MED ORDER — LACTATED RINGERS IV SOLN: INTRAVENOUS | Status: DC | PRN

## 2019-09-05 MED ORDER — PROMETHAZINE HCL 25 MG/ML IJ SOLN: 6.2500 mg | INTRAMUSCULAR | Status: DC | PRN

## 2019-09-05 MED ORDER — OXYCODONE HCL 5 MG/5ML PO SOLN: 5.0000 mg | Freq: Once | ORAL | Status: DC | PRN

## 2019-09-05 MED ORDER — 0.9 % SODIUM CHLORIDE (POUR BTL) OPTIME
TOPICAL | Status: DC | PRN
  Administered 2019-09-05: 1000 mL

## 2019-09-05 MED ORDER — ROPIVACAINE HCL 5 MG/ML IJ SOLN
INTRAMUSCULAR | Status: DC | PRN
  Administered 2019-09-05: 30 mL via PERINEURAL

## 2019-09-05 MED ORDER — HYDROCODONE-ACETAMINOPHEN 5-325 MG PO TABS: 1.0000 | ORAL_TABLET | Freq: Four times a day (QID) | ORAL | 0 refills | Status: DC | PRN

## 2019-09-05 MED ORDER — MIDAZOLAM HCL 2 MG/2ML IJ SOLN
INTRAMUSCULAR | Status: AC
  Administered 2019-09-05: 1 mg via INTRAVENOUS
  Filled 2019-09-05: qty 2

## 2019-09-05 MED ORDER — DEXMEDETOMIDINE HCL IN NACL 200 MCG/50ML IV SOLN
INTRAVENOUS | Status: DC | PRN
  Administered 2019-09-05: 24.52 ug via INTRAVENOUS

## 2019-09-05 MED ORDER — HYDROMORPHONE HCL 1 MG/ML IJ SOLN: 0.2500 mg | INTRAMUSCULAR | Status: DC | PRN

## 2019-09-05 MED ORDER — OXYCODONE HCL 5 MG PO TABS: 5.0000 mg | ORAL_TABLET | Freq: Once | ORAL | Status: DC | PRN

## 2019-09-05 MED ORDER — FENTANYL CITRATE (PF) 100 MCG/2ML IJ SOLN
INTRAMUSCULAR | Status: AC
  Administered 2019-09-05: 50 ug via INTRAVENOUS
  Filled 2019-09-05: qty 2

## 2019-09-05 MED ORDER — DEXMEDETOMIDINE HCL IN NACL 200 MCG/50ML IV SOLN
INTRAVENOUS | Status: DC | PRN
  Administered 2019-09-05: 1 ug/kg/h via INTRAVENOUS

## 2019-09-05 MED ORDER — MIDAZOLAM HCL 2 MG/2ML IJ SOLN: 1.0000 mg | Freq: Once | INTRAMUSCULAR | Status: AC

## 2019-09-05 SURGICAL SUPPLY — 48 items
APL SKNCLS STERI-STRIP NONHPOA (GAUZE/BANDAGES/DRESSINGS)
BENZOIN TINCTURE PRP APPL 2/3 (GAUZE/BANDAGES/DRESSINGS) IMPLANT
BNDG ELASTIC 4X5.8 VLCR STR LF (GAUZE/BANDAGES/DRESSINGS) ×6 IMPLANT
BNDG GAUZE ELAST 4 BULKY (GAUZE/BANDAGES/DRESSINGS) ×2 IMPLANT
CLOSURE STERI-STRIP 1/2X4 (GAUZE/BANDAGES/DRESSINGS)
CLOSURE WOUND 1/2 X4 (GAUZE/BANDAGES/DRESSINGS)
CLSR STERI-STRIP ANTIMIC 1/2X4 (GAUZE/BANDAGES/DRESSINGS) IMPLANT
CORD BIPOLAR FORCEPS 12FT (ELECTRODE) ×3 IMPLANT
COVER SURGICAL LIGHT HANDLE (MISCELLANEOUS) ×3 IMPLANT
COVER WAND RF STERILE (DRAPES) ×3 IMPLANT
CUFF TOURN SGL QUICK 18X4 (TOURNIQUET CUFF) IMPLANT
CUFF TOURN SGL QUICK 24 (TOURNIQUET CUFF)
CUFF TRNQT CYL 24X4X16.5-23 (TOURNIQUET CUFF) IMPLANT
DRSG XEROFORM 1X8 (GAUZE/BANDAGES/DRESSINGS) ×2 IMPLANT
DURAPREP 26ML APPLICATOR (WOUND CARE) ×6 IMPLANT
ELECT REM PT RETURN 9FT ADLT (ELECTROSURGICAL) ×3
ELECTRODE REM PT RTRN 9FT ADLT (ELECTROSURGICAL) ×1 IMPLANT
GAUZE SPONGE 4X4 12PLY STRL (GAUZE/BANDAGES/DRESSINGS) IMPLANT
GAUZE SPONGE 4X4 16PLY XRAY LF (GAUZE/BANDAGES/DRESSINGS) ×2 IMPLANT
GAUZE XEROFORM 1X8 LF (GAUZE/BANDAGES/DRESSINGS) IMPLANT
GLOVE BIOGEL PI IND STRL 8 (GLOVE) ×2 IMPLANT
GLOVE BIOGEL PI INDICATOR 8 (GLOVE) ×4
GLOVE ORTHO TXT STRL SZ7.5 (GLOVE) ×6 IMPLANT
GOWN STRL REUS W/ TWL LRG LVL3 (GOWN DISPOSABLE) ×1 IMPLANT
GOWN STRL REUS W/ TWL XL LVL3 (GOWN DISPOSABLE) ×1 IMPLANT
GOWN STRL REUS W/TWL 2XL LVL3 (GOWN DISPOSABLE) ×3 IMPLANT
GOWN STRL REUS W/TWL LRG LVL3 (GOWN DISPOSABLE) ×3
GOWN STRL REUS W/TWL XL LVL3 (GOWN DISPOSABLE) ×3
KIT BASIN OR (CUSTOM PROCEDURE TRAY) ×1 IMPLANT
KIT TURNOVER KIT B (KITS) ×3 IMPLANT
MANIFOLD NEPTUNE II (INSTRUMENTS) ×1 IMPLANT
NS IRRIG 1000ML POUR BTL (IV SOLUTION) ×3 IMPLANT
PACK ORTHO EXTREMITY (CUSTOM PROCEDURE TRAY) ×3 IMPLANT
PAD ABD 8X10 STRL (GAUZE/BANDAGES/DRESSINGS) ×3 IMPLANT
PAD ARMBOARD 7.5X6 YLW CONV (MISCELLANEOUS) ×6 IMPLANT
SLING ARM FOAM STRAP XLG (SOFTGOODS) ×2 IMPLANT
STRIP CLOSURE SKIN 1/2X4 (GAUZE/BANDAGES/DRESSINGS) IMPLANT
SUCTION FRAZIER HANDLE 10FR (MISCELLANEOUS)
SUCTION TUBE FRAZIER 10FR DISP (MISCELLANEOUS) IMPLANT
SUT ETHILON 4 0 PS 2 18 (SUTURE) ×1 IMPLANT
SUT VIC AB 2-0 CT1 27 (SUTURE) ×3
SUT VIC AB 2-0 CT1 TAPERPNT 27 (SUTURE) ×1 IMPLANT
SUT VIC AB 3-0 FS2 27 (SUTURE) ×2 IMPLANT
TOWEL GREEN STERILE (TOWEL DISPOSABLE) ×3 IMPLANT
TOWEL GREEN STERILE FF (TOWEL DISPOSABLE) IMPLANT
TUBE CONNECTING 12'X1/4 (SUCTIONS)
TUBE CONNECTING 12X1/4 (SUCTIONS) IMPLANT
WATER STERILE IRR 1000ML POUR (IV SOLUTION) IMPLANT

## 2019-09-05 NOTE — Progress Notes (Signed)
Edward Heath, [employer] rep contacted per Dr. Ammie Ferrier request for pt ICD management for L elbow ulnar nerve decompression today. Per Tomi Bamberger and Dr. Sabra Heck, ok for rep to not be present during procedure.

## 2019-09-05 NOTE — Transfer of Care (Signed)
Immediate Anesthesia Transfer of Care Note  Patient: Edward Heath  Procedure(s) Performed: LEFT ELBOW ULNAR NERVE DECOMPRESSION (Left Arm Upper)  Patient Location: PACU  Anesthesia Type:MAC combined with regional for post-op pain  Level of Consciousness: awake  Airway & Oxygen Therapy: Patient Spontanous Breathing and Patient connected to face mask oxygen  Post-op Assessment: Report given to RN and Post -op Vital signs reviewed and stable  Post vital signs: Reviewed and stable  Last Vitals:  Vitals Value Taken Time  BP 95/58 09/05/19 1411  Temp    Pulse 59 09/05/19 1413  Resp 22 09/05/19 1413  SpO2 96 % 09/05/19 1413  Vitals shown include unvalidated device data.  Last Pain:  Vitals:   09/05/19 1105  TempSrc:   PainSc: 0-No pain         Complications: No apparent anesthesia complications

## 2019-09-05 NOTE — Anesthesia Procedure Notes (Signed)
Anesthesia Regional Block: Supraclavicular block   Pre-Anesthetic Checklist: ,, timeout performed, Correct Patient, Correct Site, Correct Laterality, Correct Procedure, Correct Position, site marked, Risks and benefits discussed,  Surgical consent,  Pre-op evaluation,  At surgeon's request and post-op pain management  Laterality: Left  Prep: chloraprep       Needles:  Injection technique: Single-shot  Needle Type: Stimiplex     Needle Length: 9cm  Needle Gauge: 21     Additional Needles:   Narrative:  Start time: 09/05/2019 10:35 AM End time: 09/05/2019 10:40 AM Injection made incrementally with aspirations every 5 mL.  Performed by: Personally  Anesthesiologist: Lynda Rainwater, MD

## 2019-09-05 NOTE — Anesthesia Postprocedure Evaluation (Signed)
Anesthesia Post Note  Patient: Edward Heath  Procedure(s) Performed: LEFT ELBOW ULNAR NERVE DECOMPRESSION (Left Arm Upper)     Patient location during evaluation: PACU Anesthesia Type: Regional Level of consciousness: awake and alert Pain management: pain level controlled Vital Signs Assessment: post-procedure vital signs reviewed and stable Respiratory status: spontaneous breathing, nonlabored ventilation, respiratory function stable and patient connected to nasal cannula oxygen Cardiovascular status: stable and blood pressure returned to baseline Postop Assessment: no apparent nausea or vomiting Anesthetic complications: no    Last Vitals:  Vitals:   09/05/19 1551 09/05/19 1557  BP:    Pulse: (!) 58 61  Resp: (!) 21   Temp:    SpO2: 93% 92%    Last Pain:  Vitals:   09/05/19 1500  TempSrc:   PainSc: 0-No pain                 Tiajuana Amass

## 2019-09-05 NOTE — Op Note (Signed)
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  Surgeon: Rodell Perna, MD  Assistant: Benjiman Core, PA-C medically necessary and present for the entire procedure  Anesthesia preoperative block plus IV sedation.  With patient's cardiac history other medical problems anesthesia staff felt that block with IV sedation was preferable to a general anesthetic.  Procedure after induction of preoperative block patient been in a sitting position in preop holding area and was transferred back to the operating room when he was laid down to put the tourniquet on he was short of breath although oxygen level with O2 showed good saturation.  He was set back up and this prevented [place] from using an armboard instead we used a male stand with a pillow.  Proximal arm tourniquet was applied 1010 drape DuraPrep from the tip of the fingers to the tourniquet in usual extremity sheets and drapes.  This required Benjiman Core, PA-C to have helped hold the arm up and since patient was in a beachchair position slightly sitting up more than that we had to raise the table significantly and then use chair for sitting for visualization of the cubital tunnel.  Patient had limited external rotation of his shoulder making it more difficult.  Usual extremity sheets and drapes were used arm was wrapped in Esmarch tourniquet inflated to 50.  Sterile skin incision was made after sterile skin marker and incision was made between the medial epicondyle and olecranon.  Superficial veins were coagulated with bipolar.  Continue dissection down to the ulnar groove where the ulnar nerve was found to mobilize.  There were thick bands of scar tissue present in the proximal portion of the cubital tunnel.  These were released the fingertip was placed up adjacent to the ulnar nerve underneath the triceps no areas of compression.   Ulnar nerve was followed distally underneath the flexor carpi ulnaris without areas of compression.  There is flattening of the ulnar nerve in the groove.  No bone spurs were present.  Area was irrigated tourniquet deflated hemostasis obtained with the bipolar cautery.  2-0 Vicryl used subtendinous tissue reapproximation 3-0 Vicryl skin staple closure and postop soft dressing with a sling.  Patient will be in the recovery room if he is not short of breath in sitting position off oxygen he can be discharged.  If he is experiencing dyspnea or other symptoms then he will be kept overnight.

## 2019-09-05 NOTE — Interval H&P Note (Signed)
History and Physical Interval Note:  09/05/2019 12:34 PM  Edward Heath  has presented today for surgery, with the diagnosis of left cubital tunnel.  The various methods of treatment have been discussed with the patient and family. After consideration of risks, benefits and other options for treatment, the patient has consented to  Procedure(s): LEFT ELBOW ULNAR NERVE DECOMPRESSION (Left) as a surgical intervention.  The patient's history has been reviewed, patient examined, no change in status, stable for surgery.  I have reviewed the patient's chart and labs.  Questions were answered to the patient's satisfaction.     [name]

## 2019-09-11 ENCOUNTER — Other Ambulatory Visit: Payer: Self-pay

## 2019-09-11 ENCOUNTER — Encounter: Payer: Self-pay | Admitting: Orthopaedic Surgery

## 2019-09-11 ENCOUNTER — Ambulatory Visit (INDEPENDENT_AMBULATORY_CARE_PROVIDER_SITE_OTHER): Payer: Medicare Other | Admitting: Orthopaedic Surgery

## 2019-09-11 VITALS — Ht 71.0 in | Wt 270.0 lb

## 2019-09-11 DIAGNOSIS — G5622 Lesion of ulnar nerve, left upper limb: Secondary | ICD-10-CM

## 2019-09-11 NOTE — Progress Notes (Signed)
Follow-up left ulnar nerve decompression he has not taken any pain pills good relief from preop symptoms.  He still has a little bit of numbness as expected but pain is better.  Band-Aid applied he can keep an Ace wrap over it so he does not bump it return in 1 week for staple removal and Steri-Strip application.

## 2019-09-12 NOTE — Discharge Summary (Signed)
Patient ID: Edward Heath MRN: [id number] DOB/AGE: Oct 27, 1945 74 y.o.  Admit date: 09/05/2019 Discharge date: 09/12/2019  Admission Diagnoses:  Active Problems:   S/P cubital tunnel release   Discharge Diagnoses:  Active Problems:   S/P cubital tunnel release  status post Procedure(s): LEFT ELBOW ULNAR NERVE DECOMPRESSION  Past Medical History:  Diagnosis Date  . AICD (automatic cardioverter/defibrillator) present   . Arthritis   . CHF (congestive heart failure) ([place])   . Coronary artery disease   . Diabetes mellitus without complication ([place])   . Dysrhythmia   . GERD (gastroesophageal reflux disease)   . Heart attack ([place]) 2004, 2015  . History of kidney stones   . Hyperlipidemia   . Hypertension   . Hypothyroidism   . Lumbar vertebral fracture ([place]) 2012  . Skin cancer 06/2014   left cheek  . Stroke ([place])    x3  . Thyroid disease   . TIA (transient ischemic attack) 2009    Surgeries: Procedure(s): LEFT ELBOW ULNAR NERVE DECOMPRESSION on 09/05/2019   Consultants:   Discharged Condition: Improved  Hospital Course: Edward Heath is an 74 y.o. male who was admitted 09/05/2019 for operative treatment of left cubital tunnel.   patient failed conservative treatments (please see the history and physical for the specifics) and had severe unremitting pain that affects sleep, daily activities and work/hobbies. After pre-op clearance, the patient was taken to the operating room on 09/05/2019 and underwent  Procedure(s): [place].    Patient was given perioperative antibiotics:  Anti-infectives (From admission, onward)   Start     Dose/Rate Route Frequency Ordered Stop   09/05/19 0600  ceFAZolin (ANCEF) 3 g in dextrose 5 % 50 mL IVPB     3 g 100 mL/hr over 30 Minutes Intravenous To ShortStay Surgical 09/04/19 0811 09/05/19 1326       Patient was given sequential compression devices and early ambulation to prevent DVT.   Patient benefited  maximally from hospital stay and there were no complications. At the time of discharge, the patient was urinating/moving their bowels without difficulty, tolerating a regular diet, pain is controlled with oral pain medications and they have been cleared by PT/OT.   Recent vital signs: No data found.   Recent laboratory studies: No results for input(s): WBC, HGB, HCT, PLT, NA, K, CL, CO2, BUN, CREATININE, GLUCOSE, INR, CALCIUM in the last 72 hours.  Invalid input(s): PT, 2   Discharge Medications:   Allergies as of 09/05/2019   No Known Allergies     Medication List    STOP taking these medications   acetaminophen 650 MG CR tablet Commonly known as: TYLENOL   amoxicillin-clavulanate 875-125 MG tablet Commonly known as: AUGMENTIN   azithromycin 250 MG tablet Commonly known as: Zithromax Z-Pak     TAKE these medications   albuterol 108 (90 Base) MCG/ACT inhaler Commonly known as: VENTOLIN HFA Inhale 2 puffs into the lungs every 6 (six) hours as needed for wheezing or shortness of breath.   amiodarone 200 MG tablet Commonly known as: PACERONE Take 200 mg by mouth daily.   apixaban 5 MG Tabs tablet Commonly known as: Eliquis Take 1 tablet (5 mg total) by mouth 2 (two) times daily. 2 BID X 7 days then 1 BID What changed: additional instructions   aspirin EC 81 MG tablet Take 81 mg by mouth daily.   atorvastatin 80 MG tablet Commonly known as: LIPITOR Take 40 mg by mouth daily.  benzonatate 100 MG capsule Commonly known as: Tessalon Perles Take 1 capsule (100 mg total) by mouth 3 (three) times daily as needed for cough.   Cholecalciferol 25 MCG (1000 UT) tablet Commonly known as: Vitamin D-1000 Max St Take 1 tablet (1,000 Units total) by mouth 2 (two) times daily.   cyanocobalamin 1000 MCG tablet Take 1,000 mcg by mouth daily.   E-400 180 MG (400 UNITS) capsule Generic drug: vitamin E Take 400 Units by mouth daily.   ferrous sulfate 325 (65 FE) MG tablet Take  325 mg by mouth daily with breakfast.   FreeStyle Libre 14 Day Reader [name] 1 each by Does not apply route daily.   FreeStyle Libre 14 Day Sensor Misc 1 each by Does not apply route every 14 (fourteen) days.   furosemide 40 MG tablet Commonly known as: LASIX Take 1 tablet (40 mg total) by mouth 2 (two) times daily.   gabapentin 300 MG capsule Commonly known as: NEURONTIN Take 600-900 mg by mouth See admin instructions. Take 600 mg by mouth in the morning and 900 mg at night   HYDROcodone-acetaminophen 5-325 MG tablet Commonly known as: Norco Take 1-2 tablets by mouth every 6 (six) hours as needed for moderate pain.   insulin aspart protamine- aspart (70-30) 100 UNIT/ML injection Commonly known as: NOVOLOG MIX 70/30 Inject 0.2 mLs (20 Units total) into the skin 2 (two) times daily with a meal. Inject 16 units into the skin in the morning and in the evening.   insulin glargine 100 UNIT/ML injection Commonly known as: Lantus Inject 0.55 mLs (55 Units total) into the skin at bedtime. What changed: how much to take   levothyroxine 125 MCG tablet Commonly known as: SYNTHROID Take 125 mcg by mouth daily before breakfast.   loratadine 10 MG tablet Commonly known as: CLARITIN Take 10 mg by mouth daily.   losartan 50 MG tablet Commonly known as: COZAAR Take 25 mg by mouth in the morning and at bedtime.   Magnesium 500 MG Tabs Take 500 mg by mouth 2 (two) times daily.   metFORMIN 1000 MG tablet Commonly known as: GLUCOPHAGE Take 1,000 mg by mouth 2 (two) times daily with a meal.   metoprolol succinate 100 MG 24 hr tablet Commonly known as: TOPROL-XL Take 1 tablet (100 mg total) by mouth daily. Take with or immediately following a meal.   nitroGLYCERIN 0.4 MG SL tablet Commonly known as: NITROSTAT Place 0.4 mg under the tongue every 5 (five) minutes as needed for chest pain.   NovoLOG FlexPen 100 UNIT/ML FlexPen Generic drug: insulin aspart Inject 22 Units into the skin  in the morning and at bedtime.   Omega-3 1000 MG Caps Take 2 g by mouth 2 (two) times daily.   omeprazole 20 MG capsule Commonly known as: PRILOSEC Take 20 mg by mouth 2 (two) times daily.   Oxcarbazepine 300 MG tablet Commonly known as: TRILEPTAL Take 150-300 mg by mouth See admin instructions. Take 150 mg by mouth in the morning and 300 mg at night   Potassium 95 MG Tbcr Take 95 mg by mouth 2 (two) times daily.   predniSONE 10 MG (21) Tbpk tablet Commonly known as: STERAPRED UNI-PAK 21 TAB Use as directed on back of pill pack       Diagnostic Studies: DG Chest 2 View  Result Date: 09/03/2019 CLINICAL DATA:  Preoperative exam for elbow surgery. EXAM: CHEST - 2 VIEW COMPARISON:  08/07/2016 FINDINGS: Previous median sternotomy. Pacemaker/AICD. Mild cardiomegaly. Aortic atherosclerosis.  The lungs are clear. No edema or effusions. Mild scarring. Ordinary degenerative changes affect the spine. IMPRESSION: No active disease. Previous median sternotomy. Pacemaker/AICD. Mild pulmonary scarring. Aortic atherosclerosis. Electronically Signed   By: Nelson Chimes M.D.   On: 09/03/2019 16:44      Follow-up Information    Schedule an appointment as soon as possible for a visit with [name], MD.   Specialty: Orthopedic Surgery Why: need rov one week postop Contact information: [place] [address] [phone]           Discharge Plan:  discharge to home  Disposition:     Signed: Benjiman Core 0 09/12/2019, 3:17 PM

## 2019-09-18 ENCOUNTER — Ambulatory Visit (INDEPENDENT_AMBULATORY_CARE_PROVIDER_SITE_OTHER): Payer: Medicare Other | Admitting: Orthopaedic Surgery

## 2019-09-18 ENCOUNTER — Other Ambulatory Visit: Payer: Self-pay

## 2019-09-18 ENCOUNTER — Encounter: Payer: Self-pay | Admitting: Orthopaedic Surgery

## 2019-09-18 VITALS — Ht 71.0 in | Wt 270.0 lb

## 2019-09-18 DIAGNOSIS — C44229 Squamous cell carcinoma of skin of left ear and external auricular canal: Secondary | ICD-10-CM | POA: Diagnosis not present

## 2019-09-18 DIAGNOSIS — Z4889 Encounter for other specified surgical aftercare: Secondary | ICD-10-CM | POA: Diagnosis not present

## 2019-09-18 DIAGNOSIS — Z483 Aftercare following surgery for neoplasm: Secondary | ICD-10-CM | POA: Diagnosis not present

## 2019-09-18 DIAGNOSIS — Z9889 Other specified postprocedural states: Secondary | ICD-10-CM

## 2019-09-18 NOTE — Progress Notes (Signed)
Post-Op Visit Note   Patient: Edward Heath           Date of Birth: [birth year]           MRN: [id number] Visit Date: 09/18/2019 PCP: Claretta Fraise, MD   Assessment & Plan:staples out left elbow. Improved small finger sensation. Small is lagging some. Incision looks good.    Chief Complaint:  Chief Complaint  Patient presents with  . Left Elbow - Follow-up    09/05/2019 Left Elbow Ulnar Nerve Decompression   Visit Diagnoses:  1. S/P cubital tunnel release     Plan: return PRN   Follow-Up Instructions: No follow-ups on file.   Orders:  No orders of the defined types were placed in this encounter.  No orders of the defined types were placed in this encounter.   Imaging: No results found.  PMFS History: Patient Active Problem List   Diagnosis Date Noted  . S/P cubital tunnel release 09/05/2019  . Cubital tunnel syndrome on left 07/03/2019  . Absolute anemia 03/05/2018  . S/P ICD (internal cardiac defibrillator) procedure 01/17/2018  . Type 2 diabetes mellitus ([place]) 12/14/2015  . Cardiomyopathy ([place]) 12/14/2015  . Hyperlipidemia 12/14/2015  . 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 Congestive heart failure ([place]) 10/09/2013  . NSTEMI, initial episode of care ([place]) 10/09/2013  . Action tremor 06/04/2013  . Personal history of other malignant neoplasm of skin 11/14/2012  . Ankylosis of spine 04/20/2011  . Fracture of T6 vertebra ([place]) 04/20/2011  . Hiatal hernia 09/02/2010  . Barrett's esophagus 09/02/2010  . Carotid stenosis, bilateral 09/02/2010  . Hypertension 08/31/2010  . CAD (coronary artery disease) 08/31/2010  . PAD (peripheral artery disease) ([place]) 08/31/2010  . DDD (degenerative disc disease), lumbar 08/31/2010  . Chronic low back pain 08/31/2010  . Actinic keratosis 08/31/2010  . Melanoma ([place]) 08/31/2010  . Anterior circulation transient ischemic attack 05/22/2009  . Stroke [facility]) 10/25/2005   Past Medical History:    Diagnosis Date  . AICD (automatic cardioverter/defibrillator) present   . Arthritis   . CHF (congestive heart failure) ([place])   . Coronary artery disease   . Diabetes mellitus without complication ([place])   . Dysrhythmia   . GERD (gastroesophageal reflux disease)   . Heart attack ([place]) 2004, 2015  . History of kidney stones   . Hyperlipidemia   . Hypertension   . Hypothyroidism   . Lumbar vertebral fracture ([place]) 2012  . Skin cancer 06/2014   left cheek  . Stroke ([place])    x3  . Thyroid disease   . TIA (transient ischemic attack) 2009    Family History  Problem Relation Age of Onset  . Heart attack Mother   . Aneurysm Father   . Heart attack Brother 35    Past Surgical History:  Procedure Laterality Date  . CARDIAC DEFIBRILLATOR PLACEMENT  12/2003, Repaired in 2012   x3  . KNEE SURGERY Right 1994  . SKIN CANCER EXCISION  06/2014   had 4 removed  . 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   Procedure: LEFT ELBOW ULNAR NERVE DECOMPRESSION;  Surgeon: [name], MD;  Location: [facility];  Service: Orthopedics;  Laterality: Left;   Social History   Occupational History  . Not on file  Tobacco Use  . Smoking status: Never Smoker  . Smokeless tobacco: Current User    Types: Chew  Substance and Sexual Activity  . Alcohol use: Yes    Comment: occ  .  Drug use: No  . Sexual activity: Yes

## 2019-11-11 ENCOUNTER — Other Ambulatory Visit: Payer: Self-pay

## 2019-11-11 ENCOUNTER — Ambulatory Visit (HOSPITAL_COMMUNITY)
Admission: RE | Admit: 2019-11-11 | Discharge: 2019-11-11 | Disposition: A | Discharge status: Home / Self Care | Payer: Medicare Other | Source: Ambulatory Visit | Attending: Family Medicine | Admitting: Family Medicine

## 2019-11-11 ENCOUNTER — Encounter: Payer: Self-pay | Admitting: Family Medicine

## 2019-11-11 ENCOUNTER — Ambulatory Visit (INDEPENDENT_AMBULATORY_CARE_PROVIDER_SITE_OTHER): Payer: Medicare Other | Admitting: Family Medicine

## 2019-11-11 ENCOUNTER — Ambulatory Visit (INDEPENDENT_AMBULATORY_CARE_PROVIDER_SITE_OTHER): Payer: Medicare Other

## 2019-11-11 VITALS — BP 115/59 | HR 76 | Temp 97.4°F | Ht 71.0 in | Wt 270.0 lb

## 2019-11-11 DIAGNOSIS — M545 Low back pain, unspecified: Secondary | ICD-10-CM

## 2019-11-11 DIAGNOSIS — W19XXXA Unspecified fall, initial encounter: Secondary | ICD-10-CM

## 2019-11-11 DIAGNOSIS — Z7901 Long term (current) use of anticoagulants: Secondary | ICD-10-CM | POA: Diagnosis not present

## 2019-11-11 DIAGNOSIS — S14109A Unspecified injury at unspecified level of cervical spinal cord, initial encounter: Secondary | ICD-10-CM

## 2019-11-11 DIAGNOSIS — S0990XA Unspecified injury of head, initial encounter: Secondary | ICD-10-CM

## 2019-11-11 DIAGNOSIS — I6782 Cerebral ischemia: Secondary | ICD-10-CM | POA: Diagnosis not present

## 2019-11-11 MED ORDER — METHYLPREDNISOLONE ACETATE 40 MG/ML IJ SUSP
40.0000 mg | Freq: Once | INTRAMUSCULAR | Status: AC
  Administered 2019-11-11: 40 mg via INTRAMUSCULAR

## 2019-11-11 MED ORDER — TIZANIDINE HCL 4 MG PO TABS: 2.0000 mg | ORAL_TABLET | Freq: Three times a day (TID) | ORAL | 0 refills | Status: DC | PRN

## 2019-11-11 MED ORDER — HYDROCODONE-ACETAMINOPHEN 5-325 MG PO TABS: 1.0000 | ORAL_TABLET | Freq: Four times a day (QID) | ORAL | 0 refills | Status: DC | PRN

## 2019-11-11 NOTE — Progress Notes (Signed)
Subjective: CC: fell PCP: Claretta Fraise, MD ION:GEXBMW D Heath is a 74 y.o. male presenting to clinic today for:  1. Fall Patient sustained a fall after stooping down to "tee up" on the golf course.  He reports having stood up and then following directly backwards onto the green.  He hit his head quite hard and fell onto his back as well.  He is had quite a bit of low back pain that is causing him difficulty ambulating secondary to pain.  He is on anticoagulants, Eliquis.  He denies any visual disturbance, loss of consciousness, slurred speech.  His wife notes that he is acting his normal self except that he is in quite a bit of pain.  Medical history significant for degenerative changes within the spine, particular the low back.  He has been established with Dr. Lorin Mercy at [facility] for previous elbow surgery.   ROS: Per HPI  No Known Allergies Past Medical History:  Diagnosis Date  . AICD (automatic cardioverter/defibrillator) present   . Arthritis   . CHF (congestive heart failure) ([place])   . Coronary artery disease   . Diabetes mellitus without complication ([place])   . Dysrhythmia   . GERD (gastroesophageal reflux disease)   . Heart attack ([place]) 2004, 2015  . History of kidney stones   . Hyperlipidemia   . Hypertension   . Hypothyroidism   . Lumbar vertebral fracture ([place]) 2012  . Skin cancer 06/2014   left cheek  . Stroke ([place])    x3  . Thyroid disease   . TIA (transient ischemic attack) 2009    Current Outpatient Medications:  .  albuterol (VENTOLIN HFA) 108 (90 Base) MCG/ACT inhaler, Inhale 2 puffs into the lungs every 6 (six) hours as needed for wheezing or shortness of breath., Disp: 18 g, Rfl: 0 .  amiodarone (PACERONE) 200 MG tablet, Take 200 mg by mouth daily., Disp: , Rfl:  .  apixaban (ELIQUIS) 5 MG TABS tablet, Take 1 tablet (5 mg total) by mouth 2 (two) times daily. 2 BID X 7 days then 1 BID, Disp: 75 tablet, Rfl: 0 .  aspirin EC 81 MG tablet, Take 81 mg by  mouth daily. , Disp: , Rfl:  .  atorvastatin (LIPITOR) 80 MG tablet, Take 40 mg by mouth daily. , Disp: , Rfl:  .  benzonatate (TESSALON PERLES) 100 MG capsule, Take 1 capsule (100 mg total) by mouth 3 (three) times daily as needed for cough., Disp: 20 capsule, Rfl: 0 .  Cholecalciferol (VITAMIN D-1000 MAX ST) 1000 units tablet, Take 1 tablet (1,000 Units total) by mouth 2 (two) times daily., Disp: 60 tablet, Rfl: 11 .  Continuous Blood Gluc Receiver (FREESTYLE LIBRE 14 DAY READER) DEVI, 1 each by Does not apply route daily., Disp: 1 each, Rfl: 0 .  Continuous Blood Gluc Sensor (FREESTYLE LIBRE 14 DAY SENSOR) MISC, 1 each by Does not apply route every 14 (fourteen) days., Disp: 2 each, Rfl: 11 .  cyanocobalamin 1000 MCG tablet, Take 1,000 mcg by mouth daily., Disp: , Rfl:  .  ferrous sulfate 325 (65 FE) MG tablet, Take 325 mg by mouth daily with breakfast. , Disp: , Rfl:  .  furosemide (LASIX) 40 MG tablet, Take 1 tablet (40 mg total) by mouth 2 (two) times daily., Disp: 60 tablet, Rfl: 0 .  gabapentin (NEURONTIN) 300 MG capsule, Take 600-900 mg by mouth See admin instructions. Take 600 mg by mouth in the morning and 900 mg at night,  Disp: , Rfl:  .  insulin aspart (NOVOLOG FLEXPEN) 100 UNIT/ML FlexPen, Inject 22 Units into the skin in the morning and at bedtime., Disp: , Rfl:  .  insulin aspart protamine- aspart (NOVOLOG MIX 70/30) (70-30) 100 UNIT/ML injection, Inject 0.2 mLs (20 Units total) into the skin 2 (two) times daily with a meal. Inject 16 units into the skin in the morning and in the evening., Disp: 20 mL, Rfl: 11 .  insulin glargine (LANTUS) 100 UNIT/ML injection, Inject 0.55 mLs (55 Units total) into the skin at bedtime. (Patient taking differently: Inject 50 Units into the skin at bedtime. ), Disp: 10 mL, Rfl: 2 .  levothyroxine (SYNTHROID, LEVOTHROID) 125 MCG tablet, Take 125 mcg by mouth daily before breakfast., Disp: , Rfl:  .  loratadine (CLARITIN) 10 MG tablet, Take 10 mg by mouth  daily., Disp: , Rfl:  .  Magnesium 500 MG TABS, Take 500 mg by mouth 2 (two) times daily. , Disp: , Rfl:  .  metFORMIN (GLUCOPHAGE) 1000 MG tablet, Take 1,000 mg by mouth 2 (two) times daily with a meal. , Disp: , Rfl:  .  metoprolol succinate (TOPROL-XL) 100 MG 24 hr tablet, Take 1 tablet (100 mg total) by mouth daily. Take with or immediately following a meal., Disp: 90 tablet, Rfl: 0 .  Omega-3 1000 MG CAPS, Take 2 g by mouth 2 (two) times daily. , Disp: , Rfl:  .  omeprazole (PRILOSEC) 20 MG capsule, Take 20 mg by mouth 2 (two) times daily. , Disp: , Rfl:  .  Oxcarbazepine (TRILEPTAL) 300 MG tablet, Take 150-300 mg by mouth See admin instructions. Take 150 mg by mouth in the morning and 300 mg at night, Disp: , Rfl:  .  Potassium 95 MG TBCR, Take 95 mg by mouth 2 (two) times daily. , Disp: , Rfl:  .  predniSONE (STERAPRED UNI-PAK 21 TAB) 10 MG (21) TBPK tablet, Use as directed on back of pill pack, Disp: 21 tablet, Rfl: 0 .  sacubitril-valsartan (ENTRESTO) 24-26 MG, Take 1 tablet by mouth 2 (two) times daily., Disp: , Rfl:  .  vitamin E (E-400) 400 UNIT capsule, Take 400 Units by mouth daily. , Disp: , Rfl:  .  nitroGLYCERIN (NITROSTAT) 0.4 MG SL tablet, Place 0.4 mg under the tongue every 5 (five) minutes as needed for chest pain. , Disp: , Rfl:  Social History   Socioeconomic History  . Marital status: Married    Spouse name: Not on file  . Number of children: Not on file  . Years of education: Not on file  . Highest education level: Not on file  Occupational History  . Not on file  Tobacco Use  . Smoking status: Never Smoker  . Smokeless tobacco: Current User    Types: Chew  Vaping Use  . Vaping Use: Never used  Substance and Sexual Activity  . Alcohol use: Yes    Comment: occ  . Drug use: No  . Sexual activity: Yes  Other Topics Concern  . Not on file  Social History Narrative  . Not on file   Social Determinants of Health   Financial Resource Strain:   . Difficulty  of Paying Living Expenses:   Food Insecurity:   . Worried About Charity fundraiser in the Last Year:   . Arboriculturist in the Last Year:   Transportation Needs:   . Film/video editor (Medical):   Marland Kitchen Lack of Transportation (Non-Medical):  Physical Activity:   . Days of Exercise per Week:   . Minutes of Exercise per Session:   Stress:   . Feeling of Stress :   Social Connections:   . Frequency of Communication with Friends and Family:   . Frequency of Social Gatherings with Friends and Family:   . Attends Religious Services:   . Active Member of Clubs or Organizations:   . Attends Archivist Meetings:   Marland Kitchen Marital Status:   Intimate Partner Violence:   . Fear of Current or Ex-Partner:   . Emotionally Abused:   Marland Kitchen Physically Abused:   . Sexually Abused:    Family History  Problem Relation Age of Onset  . Heart attack Mother   . Aneurysm Father   . Heart attack Brother 35    Objective: Office vital signs reviewed. BP (!) 115/59   Pulse 76   Temp (!) 97.4 F (36.3 C)   Ht 5\' 11"  (1.803 m)   Wt 270 lb (122.5 kg)   SpO2 98%   BMI 37.66 kg/m   Physical Examination:  General: Awake, alert, appears uncomfortable HEENT: Normal, no palpable deformities within the school.  No appreciable soft tissue lesions or hematoma.  No skin breakdown or penetrating wounds.  PERRL, EOMI Cardio: regular rate and rhythm, S1S2 heard, no murmurs appreciated MSK: antalgic gait and station Neuro: Alert and oriented.  No focal neurologic deficits  DG Cervical Spine Complete  Result Date: 11/11/2019 CLINICAL DATA:  Neck pain after fall this morning. EXAM: CERVICAL SPINE - COMPLETE 4+ VIEW COMPARISON:  None. FINDINGS: The lateral view is diagnostic to the C6-C7 level. There is no acute fracture or subluxation. Vertebral body heights are preserved. Exaggerated cervical lordosis. No listhesis. Mild disc height loss at C5-C6 and C6-C7. Remaining intervertebral disc spaces are  maintained. Moderate to severe neuroforaminal stenosis on the left at C3-C4 and C4-C5 and on the right at C5-C6 due to facet uncovertebral hypertrophy. Normal prevertebral soft tissues. IMPRESSION: 1. No acute osseous abnormality. 2. Advanced multilevel neuroforaminal stenosis described above. Electronically Signed   By: Titus Dubin M.D.   On: 11/11/2019 14:46   DG Lumbar Spine 2-3 Views  Result Date: 11/11/2019 CLINICAL DATA:  Fall.  Back pain. EXAM: LUMBAR SPINE - 2-3 VIEW COMPARISON:  CT 12/06/2017. FINDINGS: AICD noted. Prior median sternotomy. Lumbar spine scoliosis concave left. Diffuse severe multilevel degenerative change. Scoliosis and severe degenerative change make evaluation for subtle fracture difficult. No prominent compression fracture noted. Aortoiliac atherosclerotic vascular calcification. IMPRESSION: Lumbar spine scoliosis concave left. Diffuse severe multilevel degenerative change. Scoliosis and severe degenerative change make evaluation for subtle fracture difficult. No prominent compression fracture noted. 2. Aortoiliac atherosclerotic vascular disease. Electronically Signed   By: Marcello Moores  Register   On: 11/11/2019 14:47   CT Head Wo Contrast  Result Date: 11/11/2019 CLINICAL DATA:  Head trauma hit head on ground neck pain EXAM: CT HEAD WITHOUT CONTRAST CT CERVICAL SPINE WITHOUT CONTRAST TECHNIQUE: Multidetector CT imaging of the head and cervical spine was performed following the standard protocol without intravenous contrast. Multiplanar CT image reconstructions of the cervical spine were also generated. COMPARISON:  Radiograph 11/11/2019 FINDINGS: CT HEAD FINDINGS Brain: No acute territorial infarction, hemorrhage or intracranial mass is visualized. Mild to moderate atrophy. Mild hypodensity in the white matter. Nonenlarged ventricles Vascular: No hyperdense vessels. Carotid vascular and vertebral vascular calcification Skull: Normal. Negative for fracture or focal lesion.  Sinuses/Orbits: No acute finding. Other: None CT CERVICAL SPINE FINDINGS Alignment: No subluxation.  Facet alignment is maintained. Skull base and vertebrae: No acute fracture. No primary bone lesion or focal pathologic process. Soft tissues and spinal canal: No prevertebral fluid or swelling. No visible canal hematoma. Disc levels: Advanced degenerative change at the C1-C2 articulation. Diffuse degenerative changes throughout the cervical spine with multiple level disc space narrowing and osteophyte. Facet degenerative change at multiple levels with multiple level bilateral foraminal stenosis. Upper chest: Negative. Other: None IMPRESSION: 1. No CT evidence for acute intracranial abnormality. Atrophy and mild chronic small vessel ischemic change of the white matter 2. Degenerative changes of the cervical spine. No acute fracture identified Electronically Signed   By: Donavan Foil M.D.   On: 11/11/2019 16:54   CT CERVICAL SPINE WO CONTRAST  Result Date: 11/11/2019 CLINICAL DATA:  Head trauma hit head on ground neck pain EXAM: CT HEAD WITHOUT CONTRAST CT CERVICAL SPINE WITHOUT CONTRAST TECHNIQUE: Multidetector CT imaging of the head and cervical spine was performed following the standard protocol without intravenous contrast. Multiplanar CT image reconstructions of the cervical spine were also generated. COMPARISON:  Radiograph 11/11/2019 FINDINGS: CT HEAD FINDINGS Brain: No acute territorial infarction, hemorrhage or intracranial mass is visualized. Mild to moderate atrophy. Mild hypodensity in the white matter. Nonenlarged ventricles Vascular: No hyperdense vessels. Carotid vascular and vertebral vascular calcification Skull: Normal. Negative for fracture or focal lesion. Sinuses/Orbits: No acute finding. Other: None CT CERVICAL SPINE FINDINGS Alignment: No subluxation.  Facet alignment is maintained. Skull base and vertebrae: No acute fracture. No primary bone lesion or focal pathologic process. Soft tissues  and spinal canal: No prevertebral fluid or swelling. No visible canal hematoma. Disc levels: Advanced degenerative change at the C1-C2 articulation. Diffuse degenerative changes throughout the cervical spine with multiple level disc space narrowing and osteophyte. Facet degenerative change at multiple levels with multiple level bilateral foraminal stenosis. Upper chest: Negative. Other: None IMPRESSION: 1. No CT evidence for acute intracranial abnormality. Atrophy and mild chronic small vessel ischemic change of the white matter 2. Degenerative changes of the cervical spine. No acute fracture identified Electronically Signed   By: Donavan Foil M.D.   On: 11/11/2019 16:54     Assessment/ Plan: 74 y.o. male   1. Fall, initial encounter Personal review of x-ray shows what appears to be a spinous process fracture at about C6.  He has quite a bit of degenerative changes throughout the lumbar spine.  I ordered a CT head without contrast but am reaching out to the orthopedist on-call at [facility] to review his imaging studies.  May need to consider adding CT of the spine as well.  Awaiting their recommendations to see if perhaps patient needs to be seen emergently rather than urgently. **addendum: I reviewed the images with Dr. Lorin Mercy.  He felt that the change at C6 appeared to be a chronic change and not acute.  He recommended performing a CT cervical spine to confirm.  This was performed and imaging study showed no acute processes.  CT of the head was negative for acute bleed.  I reviewed the results with the patient and home care instructions reviewed.  He will follow-up PCP for ongoing needs. - DG Lumbar Spine 2-3 Views; Future - DG Cervical Spine Complete; Future - CT Head Wo Contrast; Future  2. Injury of head, initial encounter - DG Cervical Spine Complete; Future - CT Head Wo Contrast; Future  3. Chronic anticoagulation - CT Head Wo Contrast; Future  4. Acute right-sided low back pain without  sciatica The Narcotic Database  has been reviewed.  There were no red flags.   - DG Lumbar Spine 2-3 Views; Future - HYDROcodone-acetaminophen (NORCO) 5-325 MG tablet; Take 1 tablet by mouth every 6 (six) hours as needed for moderate pain.  Dispense: 20 tablet; Refill: 0 - tiZANidine (ZANAFLEX) 4 MG tablet; Take 0.5-1 tablets (2-4 mg total) by mouth every 8 (eight) hours as needed for muscle spasms.  Dispense: 30 tablet; Refill: 0 - methylPREDNISolone acetate (DEPO-MEDROL) injection 40 mg   Orders Placed This Encounter  Procedures  . DG Lumbar Spine 2-3 Views    Standing Status:   Future    Number of Occurrences:   1    Standing Expiration Date:   02/11/2020    Order Specific Question:   Reason for Exam (SYMPTOM  OR DIAGNOSIS REQUIRED)    Answer:   back pain    Order Specific Question:   Preferred imaging location?    Answer:   Internal  . DG Cervical Spine Complete    Standing Status:   Future    Number of Occurrences:   1    Standing Expiration Date:   11/10/2020    Order Specific Question:   Reason for Exam (SYMPTOM  OR DIAGNOSIS REQUIRED)    Answer:   fall, hit head    Order Specific Question:   Preferred imaging location?    Answer:   Internal    Order Specific Question:   Radiology Contrast Protocol - do NOT remove file path    Answer:   \\[email or web address]  . CT Head Wo Contrast    Standing Status:   Future    Standing Expiration Date:   11/10/2020    Order Specific Question:   ** REASON FOR EXAM (FREE TEXT)    Answer:   fall, hit head. no focal deficits.  on chronic anticoagulation    Order Specific Question:   Preferred imaging location?    Answer:   [facility]    Order Specific Question:   Call Results- Best Contact Number?    Answer:   [phone]- Hold pt    Order Specific Question:   Radiology Contrast Protocol - do NOT remove file path    Answer:   \\[email or web address]  . CT CERVICAL SPINE WO  CONTRAST    Standing Status:   Future    Standing Expiration Date:   11/10/2020    Order Specific Question:   ** REASON FOR EXAM (FREE TEXT)    Answer:   c6 spinous process fracture?  discussed with yates, rec CT c spine    Order Specific Question:   Preferred imaging location?    Answer:   [facility]    Order Specific Question:   Radiology Contrast Protocol - do NOT remove file path    Answer:   \\[email or web address]   No orders of the defined types were placed in this encounter.    Janora Norlander, DO [place] [phone]

## 2019-11-11 NOTE — Patient Instructions (Signed)
Head Injury, Adult There are many types of head injuries. They can be as minor as a bump. Some head injuries can be worse. Worse injuries include:  A strong hit to the head that shakes the brain back and forth causing damage (concussion).  A bruise (contusion) of the brain. This means there is bleeding in the brain that can cause swelling.  A cracked skull (skull fracture).  Bleeding in the brain that gathers, gets thick (makes a clot), and forms a bump (hematoma). Most problems from a head injury come in the first 24 hours. However, you may still have side effects up to 7-10 days after your injury. It is important to watch your condition for any changes. You may need to be watched in the emergency department or urgent care, or you may need to stay in the hospital. What are the causes? There are many possible causes of a head injury. A serious head injury may be caused by:  A car accident.  Bicycle or motorcycle accidents.  Sports injuries.  Falls. What are the signs or symptoms? Symptoms of a head injury include a bruise, bump, or bleeding where the injury happened. Other physical symptoms may include:  Headache.  Feeling sick to your stomach (nauseous) or vomiting.  Dizziness.  Feeling tired.  Being uncomfortable around bright lights or loud noises.  Shaking movements that you cannot control (seizures).  Trouble being woken up.  Passing out (fainting). Mental or emotional symptoms may include:  Feeling grumpy or cranky.  Confusion and memory problems.  Having trouble paying attention or concentrating.  Changes in eating or sleeping habits.  Feeling worried or nervous (anxious).  Feeling sad (depressed). How is this treated? Treatment for this condition depends on how severe the injury is and the type of injury you have. The main goal is to prevent complications and to allow the brain time to heal. Mild head injury If you have a mild head injury, you may be  sent home and treatment may include:  Being watched. A responsible adult should stay with you for 24 hours after your injury and check on you often.  Physical rest.  Brain rest.  Pain medicines. Severe head injury If you have a severe head injury, treatment may include:  Being watched closely. This includes hospitalization with frequent physical exams.  Medicines to: ? Help with pain. ? Prevent shaking movements that you cannot control. ? Help with brain swelling.  Using a machine that helps you breathe (ventilator).  Treatments to manage the swelling inside the brain.  Brain surgery. This may be needed to: ? Remove a blood clot. ? Stop the bleeding. ? Remove a part of the skull. This allows room for the brain to swell. Follow these instructions at home: Activity  Rest.  Avoid activities that are hard or tiring.  Make sure you get enough sleep.  Limit activities that need a lot of thought or attention, such as: ? Watching TV. ? Playing memory games and puzzles. ? Job-related work or homework. ? Working on the computer, social media, and texting.  Avoid activities that could cause another head injury until your doctor says it is okay. This includes playing sports. Having another head injury, especially before the first one has healed, can be dangerous.  Ask your doctor when it is safe for you to go back to your normal activities, such as work or school. Ask your doctor for a step-by-step plan for slowly going back to your normal activities.  Ask   your doctor when you can drive, ride a bicycle, or use heavy machinery. Do not do these activities if you are dizzy. Lifestyle   Do not drink alcohol until your doctor says it is okay.  Do not use drugs.  If it is harder than usual to remember things, write them down.  If you are easily distracted, try to do one thing at a time.  Talk with family members or close friends when making important decisions.  Tell your  friends, family, a trusted coworker, and work manager about your injury, symptoms, and limits (restrictions). Have them watch for any problems that are new or getting worse. General instructions  Take over-the-counter and prescription medicines only as told by your doctor.  Have someone stay with you for 24 hours after your head injury. This person should watch you for any changes in your symptoms and be ready to get help.  Keep all follow-up visits as told by your doctor. This is important. How is this prevented?  Work on your balance and strength. This can help you avoid falls.  Wear a seatbelt when you are in a moving vehicle.  Wear a helmet when you: ? Ride a bicycle. ? Ski. ? Do any other sport or activity that has a risk of injury.  If you drink alcohol: ? Limit how much you use to:  0-1 drink a day for women.  0-2 drinks a day for men. ? Be aware of how much alcohol is in your drink. In the [place] one drink equals one 12 oz bottle of beer (355 mL), one 5 oz glass of wine (148 mL), or one 1 oz glass of hard liquor (44 mL).  Make your home safer by: ? Getting rid of clutter from the floors and stairs. This includes things that can make you trip. ? Using grab bars in bathrooms and handrails by stairs. ? Placing non-slip mats on floors and in bathtubs. ? Putting more light in dim areas. Get help right away if:  You have: ? A very bad headache that is not helped by medicine. ? Trouble walking or weakness in your arms and legs. ? Clear or bloody fluid coming from your nose or ears. ? Changes in how you see (vision). ? Shaking movements that you cannot control.  You lose your balance.  You vomit.  The black centers of your eyes (pupils) change in size.  Your speech is slurred.  Your dizziness gets worse.  You pass out.  You are sleepier than normal and have trouble staying awake.  Your symptoms get worse. These symptoms may be an emergency. Do not wait to see  if the symptoms will go away. Get medical help right away. Call your local emergency services (911 in the [place] Do not drive yourself to the hospital. Summary  There are many types of head injuries. They can be as minor as a bump. Some head injuries can be worse  Treatment for this condition depends on how severe the injury is and the type of injury you have.  Ask your doctor when it is safe for you to go back to your normal activities, such as work or school.  To prevent a head injury, wear a seat belt in a car, wear a helmet when you use a a bicycle, limit your alcohol use, and make your home safer. This information is not intended to replace advice given to you by your health care provider. Make sure you discuss any questions you   have with your health care provider. Document Revised: 08/29/2018 Document Reviewed: 05/31/2018 Elsevier Patient Education  2020 [employer]vier Inc.  

## 2019-11-17 ENCOUNTER — Telehealth: Payer: Self-pay | Admitting: Family Medicine

## 2019-11-17 NOTE — Telephone Encounter (Signed)
Wife states that pt had to go to [facility] and he has a fx in his T9. They fit him for a "turtle shell" and sent him home yesterday. Wife wanted the provider to be aware.

## 2019-11-17 NOTE — Telephone Encounter (Signed)
Got it. Sorry that happened!

## 2019-11-17 NOTE — Telephone Encounter (Signed)
Just note to provider with no response needed.

## 2019-11-25 ENCOUNTER — Other Ambulatory Visit: Payer: Self-pay | Admitting: Family Medicine

## 2019-11-25 DIAGNOSIS — M545 Low back pain, unspecified: Secondary | ICD-10-CM

## 2019-11-27 ENCOUNTER — Other Ambulatory Visit: Payer: Self-pay | Admitting: Family Medicine

## 2019-11-27 DIAGNOSIS — M545 Low back pain, unspecified: Secondary | ICD-10-CM

## 2019-11-27 NOTE — Telephone Encounter (Signed)
Last OV 11/11/19. Last RF 11/11/19. Next OV not scheduled  Ok to refill?

## 2019-12-05 ENCOUNTER — Telehealth: Payer: Self-pay | Admitting: Family Medicine

## 2019-12-05 DIAGNOSIS — S22078D Other fracture of T9-T10 vertebra, subsequent encounter for fracture with routine healing: Secondary | ICD-10-CM | POA: Diagnosis not present

## 2019-12-05 NOTE — Telephone Encounter (Signed)
Pt scheduled with Dr Warrick Parisian 12/08/19 at 2:10.

## 2019-12-05 NOTE — Telephone Encounter (Signed)
Pts wife called stating that pt has a sore on his bottom that has been there for about 3 wks and isnt going away. Wants to speak with someone who can advise them on what they need to do to get someone to come to their home and look at the sore/treat the sore.

## 2019-12-06 DIAGNOSIS — Z85828 Personal history of other malignant neoplasm of skin: Secondary | ICD-10-CM | POA: Diagnosis not present

## 2019-12-06 DIAGNOSIS — I5022 Chronic systolic (congestive) heart failure: Secondary | ICD-10-CM | POA: Diagnosis not present

## 2019-12-06 DIAGNOSIS — K6389 Other specified diseases of intestine: Secondary | ICD-10-CM | POA: Diagnosis not present

## 2019-12-06 DIAGNOSIS — Z951 Presence of aortocoronary bypass graft: Secondary | ICD-10-CM | POA: Diagnosis not present

## 2019-12-06 DIAGNOSIS — I2510 Atherosclerotic heart disease of native coronary artery without angina pectoris: Secondary | ICD-10-CM | POA: Diagnosis not present

## 2019-12-06 DIAGNOSIS — I480 Paroxysmal atrial fibrillation: Secondary | ICD-10-CM | POA: Diagnosis not present

## 2019-12-06 DIAGNOSIS — Z87442 Personal history of urinary calculi: Secondary | ICD-10-CM | POA: Diagnosis not present

## 2019-12-06 DIAGNOSIS — S22078D Other fracture of T9-T10 vertebra, subsequent encounter for fracture with routine healing: Secondary | ICD-10-CM | POA: Diagnosis not present

## 2019-12-06 DIAGNOSIS — G5622 Lesion of ulnar nerve, left upper limb: Secondary | ICD-10-CM | POA: Diagnosis not present

## 2019-12-06 DIAGNOSIS — L821 Other seborrheic keratosis: Secondary | ICD-10-CM | POA: Diagnosis not present

## 2019-12-06 DIAGNOSIS — E785 Hyperlipidemia, unspecified: Secondary | ICD-10-CM | POA: Diagnosis not present

## 2019-12-06 DIAGNOSIS — L89152 Pressure ulcer of sacral region, stage 2: Secondary | ICD-10-CM | POA: Diagnosis not present

## 2019-12-06 DIAGNOSIS — I255 Ischemic cardiomyopathy: Secondary | ICD-10-CM | POA: Diagnosis not present

## 2019-12-06 DIAGNOSIS — Z7901 Long term (current) use of anticoagulants: Secondary | ICD-10-CM | POA: Diagnosis not present

## 2019-12-06 DIAGNOSIS — Z955 Presence of coronary angioplasty implant and graft: Secondary | ICD-10-CM | POA: Diagnosis not present

## 2019-12-06 DIAGNOSIS — W1830XD Fall on same level, unspecified, subsequent encounter: Secondary | ICD-10-CM | POA: Diagnosis not present

## 2019-12-06 DIAGNOSIS — Z8673 Personal history of transient ischemic attack (TIA), and cerebral infarction without residual deficits: Secondary | ICD-10-CM | POA: Diagnosis not present

## 2019-12-06 DIAGNOSIS — F17220 Nicotine dependence, chewing tobacco, uncomplicated: Secondary | ICD-10-CM | POA: Diagnosis not present

## 2019-12-06 DIAGNOSIS — E1142 Type 2 diabetes mellitus with diabetic polyneuropathy: Secondary | ICD-10-CM | POA: Diagnosis not present

## 2019-12-06 DIAGNOSIS — Z950 Presence of cardiac pacemaker: Secondary | ICD-10-CM | POA: Diagnosis not present

## 2019-12-06 DIAGNOSIS — Z794 Long term (current) use of insulin: Secondary | ICD-10-CM | POA: Diagnosis not present

## 2019-12-06 DIAGNOSIS — I110 Hypertensive heart disease with heart failure: Secondary | ICD-10-CM | POA: Diagnosis not present

## 2019-12-06 DIAGNOSIS — Z95810 Presence of automatic (implantable) cardiac defibrillator: Secondary | ICD-10-CM | POA: Diagnosis not present

## 2019-12-06 DIAGNOSIS — Z7982 Long term (current) use of aspirin: Secondary | ICD-10-CM | POA: Diagnosis not present

## 2019-12-08 ENCOUNTER — Ambulatory Visit: Payer: Medicare Other | Admitting: Family Medicine

## 2019-12-09 DIAGNOSIS — I2510 Atherosclerotic heart disease of native coronary artery without angina pectoris: Secondary | ICD-10-CM | POA: Diagnosis not present

## 2019-12-09 DIAGNOSIS — L89152 Pressure ulcer of sacral region, stage 2: Secondary | ICD-10-CM | POA: Diagnosis not present

## 2019-12-09 DIAGNOSIS — S22078D Other fracture of T9-T10 vertebra, subsequent encounter for fracture with routine healing: Secondary | ICD-10-CM | POA: Diagnosis not present

## 2019-12-09 DIAGNOSIS — E1142 Type 2 diabetes mellitus with diabetic polyneuropathy: Secondary | ICD-10-CM | POA: Diagnosis not present

## 2019-12-09 DIAGNOSIS — I5022 Chronic systolic (congestive) heart failure: Secondary | ICD-10-CM | POA: Diagnosis not present

## 2019-12-09 DIAGNOSIS — I110 Hypertensive heart disease with heart failure: Secondary | ICD-10-CM | POA: Diagnosis not present

## 2019-12-11 ENCOUNTER — Ambulatory Visit (INDEPENDENT_AMBULATORY_CARE_PROVIDER_SITE_OTHER): Payer: PRIVATE HEALTH INSURANCE

## 2019-12-11 ENCOUNTER — Other Ambulatory Visit: Payer: Self-pay

## 2019-12-11 DIAGNOSIS — M454 Ankylosing spondylitis of thoracic region: Secondary | ICD-10-CM | POA: Diagnosis not present

## 2019-12-11 DIAGNOSIS — I110 Hypertensive heart disease with heart failure: Secondary | ICD-10-CM

## 2019-12-11 DIAGNOSIS — I255 Ischemic cardiomyopathy: Secondary | ICD-10-CM | POA: Diagnosis not present

## 2019-12-11 DIAGNOSIS — G5622 Lesion of ulnar nerve, left upper limb: Secondary | ICD-10-CM

## 2019-12-11 DIAGNOSIS — E785 Hyperlipidemia, unspecified: Secondary | ICD-10-CM | POA: Diagnosis not present

## 2019-12-11 DIAGNOSIS — L89152 Pressure ulcer of sacral region, stage 2: Secondary | ICD-10-CM

## 2019-12-11 DIAGNOSIS — S22079D Unspecified fracture of T9-T10 vertebra, subsequent encounter for fracture with routine healing: Secondary | ICD-10-CM | POA: Diagnosis not present

## 2019-12-11 DIAGNOSIS — I2510 Atherosclerotic heart disease of native coronary artery without angina pectoris: Secondary | ICD-10-CM | POA: Diagnosis not present

## 2019-12-11 DIAGNOSIS — Z7901 Long term (current) use of anticoagulants: Secondary | ICD-10-CM | POA: Diagnosis not present

## 2019-12-11 DIAGNOSIS — Z95810 Presence of automatic (implantable) cardiac defibrillator: Secondary | ICD-10-CM

## 2019-12-11 DIAGNOSIS — I5022 Chronic systolic (congestive) heart failure: Secondary | ICD-10-CM | POA: Diagnosis not present

## 2019-12-11 DIAGNOSIS — W1830XD Fall on same level, unspecified, subsequent encounter: Secondary | ICD-10-CM

## 2019-12-11 DIAGNOSIS — Z8673 Personal history of transient ischemic attack (TIA), and cerebral infarction without residual deficits: Secondary | ICD-10-CM

## 2019-12-11 DIAGNOSIS — Z950 Presence of cardiac pacemaker: Secondary | ICD-10-CM

## 2019-12-11 DIAGNOSIS — E1142 Type 2 diabetes mellitus with diabetic polyneuropathy: Secondary | ICD-10-CM | POA: Diagnosis not present

## 2019-12-11 DIAGNOSIS — M8588 Other specified disorders of bone density and structure, other site: Secondary | ICD-10-CM | POA: Diagnosis not present

## 2019-12-11 DIAGNOSIS — F17220 Nicotine dependence, chewing tobacco, uncomplicated: Secondary | ICD-10-CM

## 2019-12-11 DIAGNOSIS — Z951 Presence of aortocoronary bypass graft: Secondary | ICD-10-CM | POA: Diagnosis not present

## 2019-12-11 DIAGNOSIS — K6389 Other specified diseases of intestine: Secondary | ICD-10-CM

## 2019-12-11 DIAGNOSIS — S22059D Unspecified fracture of T5-T6 vertebra, subsequent encounter for fracture with routine healing: Secondary | ICD-10-CM | POA: Diagnosis not present

## 2019-12-11 DIAGNOSIS — I480 Paroxysmal atrial fibrillation: Secondary | ICD-10-CM

## 2019-12-11 DIAGNOSIS — M438X4 Other specified deforming dorsopathies, thoracic region: Secondary | ICD-10-CM | POA: Diagnosis not present

## 2019-12-11 DIAGNOSIS — Z955 Presence of coronary angioplasty implant and graft: Secondary | ICD-10-CM

## 2019-12-11 DIAGNOSIS — L821 Other seborrheic keratosis: Secondary | ICD-10-CM

## 2019-12-11 DIAGNOSIS — R55 Syncope and collapse: Secondary | ICD-10-CM | POA: Diagnosis not present

## 2019-12-11 DIAGNOSIS — Z794 Long term (current) use of insulin: Secondary | ICD-10-CM

## 2019-12-11 DIAGNOSIS — Z85828 Personal history of other malignant neoplasm of skin: Secondary | ICD-10-CM

## 2019-12-11 DIAGNOSIS — Z87442 Personal history of urinary calculi: Secondary | ICD-10-CM

## 2019-12-11 DIAGNOSIS — S22078D Other fracture of T9-T10 vertebra, subsequent encounter for fracture with routine healing: Secondary | ICD-10-CM

## 2019-12-11 DIAGNOSIS — S22069D Unspecified fracture of T7-T8 vertebra, subsequent encounter for fracture with routine healing: Secondary | ICD-10-CM | POA: Diagnosis not present

## 2019-12-11 DIAGNOSIS — Z7982 Long term (current) use of aspirin: Secondary | ICD-10-CM

## 2019-12-11 DIAGNOSIS — I4891 Unspecified atrial fibrillation: Secondary | ICD-10-CM | POA: Diagnosis not present

## 2019-12-12 DIAGNOSIS — I5022 Chronic systolic (congestive) heart failure: Secondary | ICD-10-CM | POA: Diagnosis not present

## 2019-12-12 DIAGNOSIS — E1142 Type 2 diabetes mellitus with diabetic polyneuropathy: Secondary | ICD-10-CM | POA: Diagnosis not present

## 2019-12-12 DIAGNOSIS — I2510 Atherosclerotic heart disease of native coronary artery without angina pectoris: Secondary | ICD-10-CM | POA: Diagnosis not present

## 2019-12-12 DIAGNOSIS — L89152 Pressure ulcer of sacral region, stage 2: Secondary | ICD-10-CM | POA: Diagnosis not present

## 2019-12-12 DIAGNOSIS — I110 Hypertensive heart disease with heart failure: Secondary | ICD-10-CM | POA: Diagnosis not present

## 2019-12-12 DIAGNOSIS — S22078D Other fracture of T9-T10 vertebra, subsequent encounter for fracture with routine healing: Secondary | ICD-10-CM | POA: Diagnosis not present

## 2019-12-15 DIAGNOSIS — I2510 Atherosclerotic heart disease of native coronary artery without angina pectoris: Secondary | ICD-10-CM | POA: Diagnosis not present

## 2019-12-15 DIAGNOSIS — L89152 Pressure ulcer of sacral region, stage 2: Secondary | ICD-10-CM | POA: Diagnosis not present

## 2019-12-15 DIAGNOSIS — E1142 Type 2 diabetes mellitus with diabetic polyneuropathy: Secondary | ICD-10-CM | POA: Diagnosis not present

## 2019-12-15 DIAGNOSIS — I5022 Chronic systolic (congestive) heart failure: Secondary | ICD-10-CM | POA: Diagnosis not present

## 2019-12-15 DIAGNOSIS — I110 Hypertensive heart disease with heart failure: Secondary | ICD-10-CM | POA: Diagnosis not present

## 2019-12-15 DIAGNOSIS — S22078D Other fracture of T9-T10 vertebra, subsequent encounter for fracture with routine healing: Secondary | ICD-10-CM | POA: Diagnosis not present

## 2019-12-17 DIAGNOSIS — I2510 Atherosclerotic heart disease of native coronary artery without angina pectoris: Secondary | ICD-10-CM | POA: Diagnosis not present

## 2019-12-17 DIAGNOSIS — L89152 Pressure ulcer of sacral region, stage 2: Secondary | ICD-10-CM | POA: Diagnosis not present

## 2019-12-17 DIAGNOSIS — I5022 Chronic systolic (congestive) heart failure: Secondary | ICD-10-CM | POA: Diagnosis not present

## 2019-12-17 DIAGNOSIS — E1142 Type 2 diabetes mellitus with diabetic polyneuropathy: Secondary | ICD-10-CM | POA: Diagnosis not present

## 2019-12-17 DIAGNOSIS — S22078D Other fracture of T9-T10 vertebra, subsequent encounter for fracture with routine healing: Secondary | ICD-10-CM | POA: Diagnosis not present

## 2019-12-17 DIAGNOSIS — I110 Hypertensive heart disease with heart failure: Secondary | ICD-10-CM | POA: Diagnosis not present

## 2019-12-20 DIAGNOSIS — M5116 Intervertebral disc disorders with radiculopathy, lumbar region: Secondary | ICD-10-CM | POA: Diagnosis not present

## 2019-12-20 DIAGNOSIS — S22079D Unspecified fracture of T9-T10 vertebra, subsequent encounter for fracture with routine healing: Secondary | ICD-10-CM | POA: Diagnosis not present

## 2019-12-23 DIAGNOSIS — M8448XA Pathological fracture, other site, initial encounter for fracture: Secondary | ICD-10-CM | POA: Diagnosis not present

## 2020-01-05 DIAGNOSIS — S22078D Other fracture of T9-T10 vertebra, subsequent encounter for fracture with routine healing: Secondary | ICD-10-CM | POA: Diagnosis not present

## 2020-01-08 DIAGNOSIS — U071 COVID-19: Secondary | ICD-10-CM | POA: Diagnosis not present

## 2020-01-08 DIAGNOSIS — M47814 Spondylosis without myelopathy or radiculopathy, thoracic region: Secondary | ICD-10-CM | POA: Diagnosis not present

## 2020-01-08 DIAGNOSIS — Z4789 Encounter for other orthopedic aftercare: Secondary | ICD-10-CM | POA: Diagnosis not present

## 2020-01-08 DIAGNOSIS — D509 Iron deficiency anemia, unspecified: Secondary | ICD-10-CM | POA: Diagnosis not present

## 2020-01-08 DIAGNOSIS — I509 Heart failure, unspecified: Secondary | ICD-10-CM | POA: Diagnosis not present

## 2020-01-08 DIAGNOSIS — I5022 Chronic systolic (congestive) heart failure: Secondary | ICD-10-CM | POA: Diagnosis not present

## 2020-01-08 DIAGNOSIS — D485 Neoplasm of uncertain behavior of skin: Secondary | ICD-10-CM | POA: Diagnosis not present

## 2020-01-08 DIAGNOSIS — N390 Urinary tract infection, site not specified: Secondary | ICD-10-CM | POA: Diagnosis not present

## 2020-01-08 DIAGNOSIS — E118 Type 2 diabetes mellitus with unspecified complications: Secondary | ICD-10-CM | POA: Diagnosis not present

## 2020-01-08 DIAGNOSIS — R0989 Other specified symptoms and signs involving the circulatory and respiratory systems: Secondary | ICD-10-CM | POA: Diagnosis not present

## 2020-01-08 DIAGNOSIS — S22079D Unspecified fracture of T9-T10 vertebra, subsequent encounter for fracture with routine healing: Secondary | ICD-10-CM | POA: Diagnosis not present

## 2020-01-08 DIAGNOSIS — S22078D Other fracture of T9-T10 vertebra, subsequent encounter for fracture with routine healing: Secondary | ICD-10-CM | POA: Diagnosis not present

## 2020-01-08 DIAGNOSIS — Z1152 Encounter for screening for COVID-19: Secondary | ICD-10-CM | POA: Diagnosis not present

## 2020-01-08 DIAGNOSIS — G64 Other disorders of peripheral nervous system: Secondary | ICD-10-CM | POA: Diagnosis not present

## 2020-01-08 DIAGNOSIS — R2681 Unsteadiness on feet: Secondary | ICD-10-CM | POA: Diagnosis not present

## 2020-01-08 DIAGNOSIS — S22072D Unstable burst fracture of T9-T10 vertebra, subsequent encounter for fracture with routine healing: Secondary | ICD-10-CM | POA: Diagnosis not present

## 2020-01-08 DIAGNOSIS — S22079A Unspecified fracture of T9-T10 vertebra, initial encounter for closed fracture: Secondary | ICD-10-CM | POA: Diagnosis not present

## 2020-01-08 DIAGNOSIS — T8131XA Disruption of external operation (surgical) wound, not elsewhere classified, initial encounter: Secondary | ICD-10-CM | POA: Diagnosis not present

## 2020-01-08 DIAGNOSIS — J302 Other seasonal allergic rhinitis: Secondary | ICD-10-CM | POA: Diagnosis not present

## 2020-01-08 DIAGNOSIS — Z8589 Personal history of malignant neoplasm of other organs and systems: Secondary | ICD-10-CM | POA: Diagnosis not present

## 2020-01-08 DIAGNOSIS — R269 Unspecified abnormalities of gait and mobility: Secondary | ICD-10-CM | POA: Diagnosis not present

## 2020-01-08 DIAGNOSIS — Z8781 Personal history of (healed) traumatic fracture: Secondary | ICD-10-CM | POA: Diagnosis not present

## 2020-01-08 DIAGNOSIS — R29898 Other symptoms and signs involving the musculoskeletal system: Secondary | ICD-10-CM | POA: Diagnosis not present

## 2020-01-08 DIAGNOSIS — M6281 Muscle weakness (generalized): Secondary | ICD-10-CM | POA: Diagnosis not present

## 2020-01-08 DIAGNOSIS — Z85828 Personal history of other malignant neoplasm of skin: Secondary | ICD-10-CM | POA: Diagnosis not present

## 2020-01-08 DIAGNOSIS — Z743 Need for continuous supervision: Secondary | ICD-10-CM | POA: Diagnosis not present

## 2020-01-08 DIAGNOSIS — R609 Edema, unspecified: Secondary | ICD-10-CM | POA: Diagnosis not present

## 2020-01-08 DIAGNOSIS — M4814 Ankylosing hyperostosis [Forestier], thoracic region: Secondary | ICD-10-CM | POA: Diagnosis not present

## 2020-01-08 DIAGNOSIS — F0630 Mood disorder due to known physiological condition, unspecified: Secondary | ICD-10-CM | POA: Diagnosis not present

## 2020-01-08 DIAGNOSIS — S22009A Unspecified fracture of unspecified thoracic vertebra, initial encounter for closed fracture: Secondary | ICD-10-CM | POA: Diagnosis not present

## 2020-01-08 DIAGNOSIS — G894 Chronic pain syndrome: Secondary | ICD-10-CM | POA: Diagnosis not present

## 2020-01-08 DIAGNOSIS — J984 Other disorders of lung: Secondary | ICD-10-CM | POA: Diagnosis not present

## 2020-01-08 DIAGNOSIS — C44329 Squamous cell carcinoma of skin of other parts of face: Secondary | ICD-10-CM | POA: Diagnosis not present

## 2020-01-08 DIAGNOSIS — M8588 Other specified disorders of bone density and structure, other site: Secondary | ICD-10-CM | POA: Diagnosis not present

## 2020-01-08 DIAGNOSIS — K219 Gastro-esophageal reflux disease without esophagitis: Secondary | ICD-10-CM | POA: Diagnosis not present

## 2020-01-08 DIAGNOSIS — I10 Essential (primary) hypertension: Secondary | ICD-10-CM | POA: Diagnosis not present

## 2020-01-08 DIAGNOSIS — J90 Pleural effusion, not elsewhere classified: Secondary | ICD-10-CM | POA: Diagnosis not present

## 2020-01-08 DIAGNOSIS — Z4800 Encounter for change or removal of nonsurgical wound dressing: Secondary | ICD-10-CM | POA: Diagnosis not present

## 2020-01-08 DIAGNOSIS — Z4802 Encounter for removal of sutures: Secondary | ICD-10-CM | POA: Diagnosis not present

## 2020-01-08 DIAGNOSIS — E559 Vitamin D deficiency, unspecified: Secondary | ICD-10-CM | POA: Diagnosis not present

## 2020-01-08 DIAGNOSIS — I255 Ischemic cardiomyopathy: Secondary | ICD-10-CM | POA: Diagnosis not present

## 2020-01-08 DIAGNOSIS — L089 Local infection of the skin and subcutaneous tissue, unspecified: Secondary | ICD-10-CM | POA: Diagnosis not present

## 2020-01-08 DIAGNOSIS — I480 Paroxysmal atrial fibrillation: Secondary | ICD-10-CM | POA: Diagnosis not present

## 2020-01-08 DIAGNOSIS — M4854XA Collapsed vertebra, not elsewhere classified, thoracic region, initial encounter for fracture: Secondary | ICD-10-CM | POA: Diagnosis not present

## 2020-01-08 DIAGNOSIS — Z09 Encounter for follow-up examination after completed treatment for conditions other than malignant neoplasm: Secondary | ICD-10-CM | POA: Diagnosis not present

## 2020-01-08 DIAGNOSIS — E039 Hypothyroidism, unspecified: Secondary | ICD-10-CM | POA: Diagnosis not present

## 2020-01-08 DIAGNOSIS — Z981 Arthrodesis status: Secondary | ICD-10-CM | POA: Diagnosis not present

## 2020-01-08 DIAGNOSIS — E119 Type 2 diabetes mellitus without complications: Secondary | ICD-10-CM | POA: Diagnosis not present

## 2020-01-08 DIAGNOSIS — G609 Hereditary and idiopathic neuropathy, unspecified: Secondary | ICD-10-CM | POA: Diagnosis not present

## 2020-01-13 DIAGNOSIS — K219 Gastro-esophageal reflux disease without esophagitis: Secondary | ICD-10-CM | POA: Diagnosis not present

## 2020-01-13 DIAGNOSIS — M6281 Muscle weakness (generalized): Secondary | ICD-10-CM | POA: Diagnosis not present

## 2020-01-13 DIAGNOSIS — D509 Iron deficiency anemia, unspecified: Secondary | ICD-10-CM | POA: Diagnosis not present

## 2020-01-13 DIAGNOSIS — I255 Ischemic cardiomyopathy: Secondary | ICD-10-CM | POA: Diagnosis not present

## 2020-01-13 DIAGNOSIS — G64 Other disorders of peripheral nervous system: Secondary | ICD-10-CM | POA: Diagnosis not present

## 2020-01-13 DIAGNOSIS — I5022 Chronic systolic (congestive) heart failure: Secondary | ICD-10-CM | POA: Diagnosis not present

## 2020-01-13 DIAGNOSIS — E039 Hypothyroidism, unspecified: Secondary | ICD-10-CM | POA: Diagnosis not present

## 2020-01-13 DIAGNOSIS — E559 Vitamin D deficiency, unspecified: Secondary | ICD-10-CM | POA: Diagnosis not present

## 2020-01-13 DIAGNOSIS — E118 Type 2 diabetes mellitus with unspecified complications: Secondary | ICD-10-CM | POA: Diagnosis not present

## 2020-01-13 DIAGNOSIS — I480 Paroxysmal atrial fibrillation: Secondary | ICD-10-CM | POA: Diagnosis not present

## 2020-01-13 DIAGNOSIS — S22009A Unspecified fracture of unspecified thoracic vertebra, initial encounter for closed fracture: Secondary | ICD-10-CM | POA: Diagnosis not present

## 2020-01-15 DIAGNOSIS — E559 Vitamin D deficiency, unspecified: Secondary | ICD-10-CM | POA: Diagnosis not present

## 2020-01-15 DIAGNOSIS — E118 Type 2 diabetes mellitus with unspecified complications: Secondary | ICD-10-CM | POA: Diagnosis not present

## 2020-01-15 DIAGNOSIS — M6281 Muscle weakness (generalized): Secondary | ICD-10-CM | POA: Diagnosis not present

## 2020-01-15 DIAGNOSIS — I5022 Chronic systolic (congestive) heart failure: Secondary | ICD-10-CM | POA: Diagnosis not present

## 2020-01-15 DIAGNOSIS — K219 Gastro-esophageal reflux disease without esophagitis: Secondary | ICD-10-CM | POA: Diagnosis not present

## 2020-01-15 DIAGNOSIS — I255 Ischemic cardiomyopathy: Secondary | ICD-10-CM | POA: Diagnosis not present

## 2020-01-15 DIAGNOSIS — I480 Paroxysmal atrial fibrillation: Secondary | ICD-10-CM | POA: Diagnosis not present

## 2020-01-16 DIAGNOSIS — I255 Ischemic cardiomyopathy: Secondary | ICD-10-CM | POA: Diagnosis not present

## 2020-01-16 DIAGNOSIS — E118 Type 2 diabetes mellitus with unspecified complications: Secondary | ICD-10-CM | POA: Diagnosis not present

## 2020-01-16 DIAGNOSIS — E559 Vitamin D deficiency, unspecified: Secondary | ICD-10-CM | POA: Diagnosis not present

## 2020-01-16 DIAGNOSIS — M6281 Muscle weakness (generalized): Secondary | ICD-10-CM | POA: Diagnosis not present

## 2020-01-16 DIAGNOSIS — K219 Gastro-esophageal reflux disease without esophagitis: Secondary | ICD-10-CM | POA: Diagnosis not present

## 2020-01-16 DIAGNOSIS — E039 Hypothyroidism, unspecified: Secondary | ICD-10-CM | POA: Diagnosis not present

## 2020-01-16 DIAGNOSIS — G64 Other disorders of peripheral nervous system: Secondary | ICD-10-CM | POA: Diagnosis not present

## 2020-01-16 DIAGNOSIS — S22009A Unspecified fracture of unspecified thoracic vertebra, initial encounter for closed fracture: Secondary | ICD-10-CM | POA: Diagnosis not present

## 2020-01-16 DIAGNOSIS — I5022 Chronic systolic (congestive) heart failure: Secondary | ICD-10-CM | POA: Diagnosis not present

## 2020-01-16 DIAGNOSIS — I480 Paroxysmal atrial fibrillation: Secondary | ICD-10-CM | POA: Diagnosis not present

## 2020-01-16 DIAGNOSIS — G894 Chronic pain syndrome: Secondary | ICD-10-CM | POA: Diagnosis not present

## 2020-01-23 DIAGNOSIS — Z4802 Encounter for removal of sutures: Secondary | ICD-10-CM | POA: Diagnosis not present

## 2020-01-26 DIAGNOSIS — R0989 Other specified symptoms and signs involving the circulatory and respiratory systems: Secondary | ICD-10-CM | POA: Diagnosis not present

## 2020-01-27 DIAGNOSIS — E559 Vitamin D deficiency, unspecified: Secondary | ICD-10-CM | POA: Diagnosis not present

## 2020-01-27 DIAGNOSIS — E039 Hypothyroidism, unspecified: Secondary | ICD-10-CM | POA: Diagnosis not present

## 2020-01-27 DIAGNOSIS — S22009A Unspecified fracture of unspecified thoracic vertebra, initial encounter for closed fracture: Secondary | ICD-10-CM | POA: Diagnosis not present

## 2020-01-27 DIAGNOSIS — I5022 Chronic systolic (congestive) heart failure: Secondary | ICD-10-CM | POA: Diagnosis not present

## 2020-01-27 DIAGNOSIS — E118 Type 2 diabetes mellitus with unspecified complications: Secondary | ICD-10-CM | POA: Diagnosis not present

## 2020-01-27 DIAGNOSIS — I480 Paroxysmal atrial fibrillation: Secondary | ICD-10-CM | POA: Diagnosis not present

## 2020-01-27 DIAGNOSIS — I255 Ischemic cardiomyopathy: Secondary | ICD-10-CM | POA: Diagnosis not present

## 2020-01-27 DIAGNOSIS — G64 Other disorders of peripheral nervous system: Secondary | ICD-10-CM | POA: Diagnosis not present

## 2020-01-27 DIAGNOSIS — M6281 Muscle weakness (generalized): Secondary | ICD-10-CM | POA: Diagnosis not present

## 2020-01-27 DIAGNOSIS — G894 Chronic pain syndrome: Secondary | ICD-10-CM | POA: Diagnosis not present

## 2020-01-27 DIAGNOSIS — K219 Gastro-esophageal reflux disease without esophagitis: Secondary | ICD-10-CM | POA: Diagnosis not present

## 2020-01-27 DIAGNOSIS — D509 Iron deficiency anemia, unspecified: Secondary | ICD-10-CM | POA: Diagnosis not present

## 2020-01-30 DIAGNOSIS — S22079A Unspecified fracture of T9-T10 vertebra, initial encounter for closed fracture: Secondary | ICD-10-CM | POA: Diagnosis not present

## 2020-01-30 DIAGNOSIS — M4814 Ankylosing hyperostosis [Forestier], thoracic region: Secondary | ICD-10-CM | POA: Diagnosis not present

## 2020-01-30 DIAGNOSIS — Z09 Encounter for follow-up examination after completed treatment for conditions other than malignant neoplasm: Secondary | ICD-10-CM | POA: Diagnosis not present

## 2020-01-30 DIAGNOSIS — Z981 Arthrodesis status: Secondary | ICD-10-CM | POA: Diagnosis not present

## 2020-02-03 DIAGNOSIS — L089 Local infection of the skin and subcutaneous tissue, unspecified: Secondary | ICD-10-CM | POA: Diagnosis not present

## 2020-02-03 DIAGNOSIS — T8131XA Disruption of external operation (surgical) wound, not elsewhere classified, initial encounter: Secondary | ICD-10-CM | POA: Diagnosis not present

## 2020-02-05 DIAGNOSIS — Z4800 Encounter for change or removal of nonsurgical wound dressing: Secondary | ICD-10-CM | POA: Diagnosis not present

## 2020-02-05 DIAGNOSIS — Z8781 Personal history of (healed) traumatic fracture: Secondary | ICD-10-CM | POA: Diagnosis not present

## 2020-02-05 DIAGNOSIS — Z981 Arthrodesis status: Secondary | ICD-10-CM | POA: Diagnosis not present

## 2020-02-05 DIAGNOSIS — Z4789 Encounter for other orthopedic aftercare: Secondary | ICD-10-CM | POA: Diagnosis not present

## 2020-02-09 DIAGNOSIS — G894 Chronic pain syndrome: Secondary | ICD-10-CM | POA: Diagnosis not present

## 2020-02-09 DIAGNOSIS — L089 Local infection of the skin and subcutaneous tissue, unspecified: Secondary | ICD-10-CM | POA: Diagnosis not present

## 2020-02-09 DIAGNOSIS — T8131XA Disruption of external operation (surgical) wound, not elsewhere classified, initial encounter: Secondary | ICD-10-CM | POA: Diagnosis not present

## 2020-02-09 DIAGNOSIS — M6281 Muscle weakness (generalized): Secondary | ICD-10-CM | POA: Diagnosis not present

## 2020-02-10 DIAGNOSIS — M6281 Muscle weakness (generalized): Secondary | ICD-10-CM | POA: Diagnosis not present

## 2020-02-13 DIAGNOSIS — S22079D Unspecified fracture of T9-T10 vertebra, subsequent encounter for fracture with routine healing: Secondary | ICD-10-CM | POA: Diagnosis not present

## 2020-02-13 DIAGNOSIS — Z981 Arthrodesis status: Secondary | ICD-10-CM | POA: Diagnosis not present

## 2020-02-16 DIAGNOSIS — G894 Chronic pain syndrome: Secondary | ICD-10-CM | POA: Diagnosis not present

## 2020-02-20 DIAGNOSIS — T8131XA Disruption of external operation (surgical) wound, not elsewhere classified, initial encounter: Secondary | ICD-10-CM | POA: Diagnosis not present

## 2020-02-20 DIAGNOSIS — L089 Local infection of the skin and subcutaneous tissue, unspecified: Secondary | ICD-10-CM | POA: Diagnosis not present

## 2020-02-20 DIAGNOSIS — M6281 Muscle weakness (generalized): Secondary | ICD-10-CM | POA: Diagnosis not present

## 2020-02-20 DIAGNOSIS — E118 Type 2 diabetes mellitus with unspecified complications: Secondary | ICD-10-CM | POA: Diagnosis not present

## 2020-02-20 DIAGNOSIS — I255 Ischemic cardiomyopathy: Secondary | ICD-10-CM | POA: Diagnosis not present

## 2020-02-20 DIAGNOSIS — I5022 Chronic systolic (congestive) heart failure: Secondary | ICD-10-CM | POA: Diagnosis not present

## 2020-02-20 DIAGNOSIS — G894 Chronic pain syndrome: Secondary | ICD-10-CM | POA: Diagnosis not present

## 2020-02-20 DIAGNOSIS — E039 Hypothyroidism, unspecified: Secondary | ICD-10-CM | POA: Diagnosis not present

## 2020-02-20 DIAGNOSIS — I480 Paroxysmal atrial fibrillation: Secondary | ICD-10-CM | POA: Diagnosis not present

## 2020-02-20 DIAGNOSIS — K219 Gastro-esophageal reflux disease without esophagitis: Secondary | ICD-10-CM | POA: Diagnosis not present

## 2020-02-20 DIAGNOSIS — U071 COVID-19: Secondary | ICD-10-CM | POA: Diagnosis not present

## 2020-02-20 DIAGNOSIS — E559 Vitamin D deficiency, unspecified: Secondary | ICD-10-CM | POA: Diagnosis not present

## 2020-03-11 DIAGNOSIS — M8588 Other specified disorders of bone density and structure, other site: Secondary | ICD-10-CM | POA: Diagnosis not present

## 2020-03-11 DIAGNOSIS — S22078D Other fracture of T9-T10 vertebra, subsequent encounter for fracture with routine healing: Secondary | ICD-10-CM | POA: Diagnosis not present

## 2020-03-11 DIAGNOSIS — Z981 Arthrodesis status: Secondary | ICD-10-CM | POA: Diagnosis not present

## 2020-03-12 DIAGNOSIS — Z981 Arthrodesis status: Secondary | ICD-10-CM | POA: Insufficient documentation

## 2020-03-17 DIAGNOSIS — D485 Neoplasm of uncertain behavior of skin: Secondary | ICD-10-CM | POA: Diagnosis not present

## 2020-03-17 DIAGNOSIS — Z85828 Personal history of other malignant neoplasm of skin: Secondary | ICD-10-CM | POA: Diagnosis not present

## 2020-03-17 DIAGNOSIS — C44329 Squamous cell carcinoma of skin of other parts of face: Secondary | ICD-10-CM | POA: Diagnosis not present

## 2020-03-17 DIAGNOSIS — Z8589 Personal history of malignant neoplasm of other organs and systems: Secondary | ICD-10-CM | POA: Insufficient documentation

## 2020-04-02 DIAGNOSIS — D509 Iron deficiency anemia, unspecified: Secondary | ICD-10-CM | POA: Diagnosis not present

## 2020-04-02 DIAGNOSIS — E559 Vitamin D deficiency, unspecified: Secondary | ICD-10-CM | POA: Diagnosis not present

## 2020-04-02 DIAGNOSIS — K219 Gastro-esophageal reflux disease without esophagitis: Secondary | ICD-10-CM | POA: Diagnosis not present

## 2020-04-02 DIAGNOSIS — G64 Other disorders of peripheral nervous system: Secondary | ICD-10-CM | POA: Diagnosis not present

## 2020-04-02 DIAGNOSIS — F0630 Mood disorder due to known physiological condition, unspecified: Secondary | ICD-10-CM | POA: Diagnosis not present

## 2020-04-02 DIAGNOSIS — I5022 Chronic systolic (congestive) heart failure: Secondary | ICD-10-CM | POA: Diagnosis not present

## 2020-04-02 DIAGNOSIS — E039 Hypothyroidism, unspecified: Secondary | ICD-10-CM | POA: Diagnosis not present

## 2020-04-02 DIAGNOSIS — I480 Paroxysmal atrial fibrillation: Secondary | ICD-10-CM | POA: Diagnosis not present

## 2020-04-02 DIAGNOSIS — E118 Type 2 diabetes mellitus with unspecified complications: Secondary | ICD-10-CM | POA: Diagnosis not present

## 2020-04-02 DIAGNOSIS — M6281 Muscle weakness (generalized): Secondary | ICD-10-CM | POA: Diagnosis not present

## 2020-04-02 DIAGNOSIS — U071 COVID-19: Secondary | ICD-10-CM | POA: Diagnosis not present

## 2020-04-02 DIAGNOSIS — I255 Ischemic cardiomyopathy: Secondary | ICD-10-CM | POA: Diagnosis not present

## 2020-04-16 DIAGNOSIS — Z8673 Personal history of transient ischemic attack (TIA), and cerebral infarction without residual deficits: Secondary | ICD-10-CM | POA: Diagnosis not present

## 2020-04-16 DIAGNOSIS — S22079D Unspecified fracture of T9-T10 vertebra, subsequent encounter for fracture with routine healing: Secondary | ICD-10-CM | POA: Diagnosis not present

## 2020-04-16 DIAGNOSIS — Z794 Long term (current) use of insulin: Secondary | ICD-10-CM | POA: Diagnosis not present

## 2020-04-16 DIAGNOSIS — C44329 Squamous cell carcinoma of skin of other parts of face: Secondary | ICD-10-CM | POA: Diagnosis not present

## 2020-04-16 DIAGNOSIS — I110 Hypertensive heart disease with heart failure: Secondary | ICD-10-CM | POA: Diagnosis not present

## 2020-04-16 DIAGNOSIS — I509 Heart failure, unspecified: Secondary | ICD-10-CM | POA: Diagnosis not present

## 2020-04-16 DIAGNOSIS — Z7901 Long term (current) use of anticoagulants: Secondary | ICD-10-CM | POA: Diagnosis not present

## 2020-04-16 DIAGNOSIS — Z9181 History of falling: Secondary | ICD-10-CM | POA: Diagnosis not present

## 2020-04-16 DIAGNOSIS — E119 Type 2 diabetes mellitus without complications: Secondary | ICD-10-CM | POA: Diagnosis not present

## 2020-04-16 DIAGNOSIS — Z7984 Long term (current) use of oral hypoglycemic drugs: Secondary | ICD-10-CM | POA: Diagnosis not present

## 2020-04-19 DIAGNOSIS — S22079D Unspecified fracture of T9-T10 vertebra, subsequent encounter for fracture with routine healing: Secondary | ICD-10-CM | POA: Diagnosis not present

## 2020-04-19 DIAGNOSIS — I509 Heart failure, unspecified: Secondary | ICD-10-CM | POA: Diagnosis not present

## 2020-04-19 DIAGNOSIS — C44329 Squamous cell carcinoma of skin of other parts of face: Secondary | ICD-10-CM | POA: Diagnosis not present

## 2020-04-19 DIAGNOSIS — Z8673 Personal history of transient ischemic attack (TIA), and cerebral infarction without residual deficits: Secondary | ICD-10-CM | POA: Diagnosis not present

## 2020-04-19 DIAGNOSIS — E119 Type 2 diabetes mellitus without complications: Secondary | ICD-10-CM | POA: Diagnosis not present

## 2020-04-19 DIAGNOSIS — I110 Hypertensive heart disease with heart failure: Secondary | ICD-10-CM | POA: Diagnosis not present

## 2020-04-21 DIAGNOSIS — C44329 Squamous cell carcinoma of skin of other parts of face: Secondary | ICD-10-CM | POA: Diagnosis not present

## 2020-04-21 DIAGNOSIS — E119 Type 2 diabetes mellitus without complications: Secondary | ICD-10-CM | POA: Diagnosis not present

## 2020-04-21 DIAGNOSIS — I509 Heart failure, unspecified: Secondary | ICD-10-CM | POA: Diagnosis not present

## 2020-04-21 DIAGNOSIS — S22079D Unspecified fracture of T9-T10 vertebra, subsequent encounter for fracture with routine healing: Secondary | ICD-10-CM | POA: Diagnosis not present

## 2020-04-21 DIAGNOSIS — Z8673 Personal history of transient ischemic attack (TIA), and cerebral infarction without residual deficits: Secondary | ICD-10-CM | POA: Diagnosis not present

## 2020-04-21 DIAGNOSIS — I110 Hypertensive heart disease with heart failure: Secondary | ICD-10-CM | POA: Diagnosis not present

## 2020-04-22 DIAGNOSIS — E119 Type 2 diabetes mellitus without complications: Secondary | ICD-10-CM | POA: Diagnosis not present

## 2020-04-22 DIAGNOSIS — C44329 Squamous cell carcinoma of skin of other parts of face: Secondary | ICD-10-CM | POA: Diagnosis not present

## 2020-04-22 DIAGNOSIS — I509 Heart failure, unspecified: Secondary | ICD-10-CM | POA: Diagnosis not present

## 2020-04-22 DIAGNOSIS — Z8673 Personal history of transient ischemic attack (TIA), and cerebral infarction without residual deficits: Secondary | ICD-10-CM | POA: Diagnosis not present

## 2020-04-22 DIAGNOSIS — I110 Hypertensive heart disease with heart failure: Secondary | ICD-10-CM | POA: Diagnosis not present

## 2020-04-22 DIAGNOSIS — S22079D Unspecified fracture of T9-T10 vertebra, subsequent encounter for fracture with routine healing: Secondary | ICD-10-CM | POA: Diagnosis not present

## 2020-04-26 DIAGNOSIS — C44329 Squamous cell carcinoma of skin of other parts of face: Secondary | ICD-10-CM | POA: Diagnosis not present

## 2020-04-26 DIAGNOSIS — S22079D Unspecified fracture of T9-T10 vertebra, subsequent encounter for fracture with routine healing: Secondary | ICD-10-CM | POA: Diagnosis not present

## 2020-04-26 DIAGNOSIS — E119 Type 2 diabetes mellitus without complications: Secondary | ICD-10-CM | POA: Diagnosis not present

## 2020-04-26 DIAGNOSIS — Z8673 Personal history of transient ischemic attack (TIA), and cerebral infarction without residual deficits: Secondary | ICD-10-CM | POA: Diagnosis not present

## 2020-04-26 DIAGNOSIS — I110 Hypertensive heart disease with heart failure: Secondary | ICD-10-CM | POA: Diagnosis not present

## 2020-04-26 DIAGNOSIS — I509 Heart failure, unspecified: Secondary | ICD-10-CM | POA: Diagnosis not present

## 2020-04-28 ENCOUNTER — Other Ambulatory Visit: Payer: Self-pay | Admitting: Family Medicine

## 2020-04-28 DIAGNOSIS — Z8673 Personal history of transient ischemic attack (TIA), and cerebral infarction without residual deficits: Secondary | ICD-10-CM | POA: Diagnosis not present

## 2020-04-28 DIAGNOSIS — S22079D Unspecified fracture of T9-T10 vertebra, subsequent encounter for fracture with routine healing: Secondary | ICD-10-CM | POA: Diagnosis not present

## 2020-04-28 DIAGNOSIS — C44329 Squamous cell carcinoma of skin of other parts of face: Secondary | ICD-10-CM | POA: Diagnosis not present

## 2020-04-28 DIAGNOSIS — E119 Type 2 diabetes mellitus without complications: Secondary | ICD-10-CM | POA: Diagnosis not present

## 2020-04-28 DIAGNOSIS — I110 Hypertensive heart disease with heart failure: Secondary | ICD-10-CM | POA: Diagnosis not present

## 2020-04-28 DIAGNOSIS — I509 Heart failure, unspecified: Secondary | ICD-10-CM | POA: Diagnosis not present

## 2020-04-29 DIAGNOSIS — Z8673 Personal history of transient ischemic attack (TIA), and cerebral infarction without residual deficits: Secondary | ICD-10-CM | POA: Diagnosis not present

## 2020-04-29 DIAGNOSIS — C44329 Squamous cell carcinoma of skin of other parts of face: Secondary | ICD-10-CM | POA: Diagnosis not present

## 2020-04-29 DIAGNOSIS — I509 Heart failure, unspecified: Secondary | ICD-10-CM | POA: Diagnosis not present

## 2020-04-29 DIAGNOSIS — E119 Type 2 diabetes mellitus without complications: Secondary | ICD-10-CM | POA: Diagnosis not present

## 2020-04-29 DIAGNOSIS — S22079D Unspecified fracture of T9-T10 vertebra, subsequent encounter for fracture with routine healing: Secondary | ICD-10-CM | POA: Diagnosis not present

## 2020-04-29 DIAGNOSIS — I110 Hypertensive heart disease with heart failure: Secondary | ICD-10-CM | POA: Diagnosis not present

## 2020-05-03 ENCOUNTER — Ambulatory Visit (INDEPENDENT_AMBULATORY_CARE_PROVIDER_SITE_OTHER): Payer: PRIVATE HEALTH INSURANCE

## 2020-05-03 ENCOUNTER — Other Ambulatory Visit: Payer: Self-pay

## 2020-05-03 DIAGNOSIS — C44329 Squamous cell carcinoma of skin of other parts of face: Secondary | ICD-10-CM | POA: Diagnosis not present

## 2020-05-03 DIAGNOSIS — Z7901 Long term (current) use of anticoagulants: Secondary | ICD-10-CM | POA: Diagnosis not present

## 2020-05-03 DIAGNOSIS — E119 Type 2 diabetes mellitus without complications: Secondary | ICD-10-CM

## 2020-05-03 DIAGNOSIS — I110 Hypertensive heart disease with heart failure: Secondary | ICD-10-CM

## 2020-05-03 DIAGNOSIS — Z9181 History of falling: Secondary | ICD-10-CM

## 2020-05-03 DIAGNOSIS — I509 Heart failure, unspecified: Secondary | ICD-10-CM | POA: Diagnosis not present

## 2020-05-03 DIAGNOSIS — Z794 Long term (current) use of insulin: Secondary | ICD-10-CM | POA: Diagnosis not present

## 2020-05-03 DIAGNOSIS — Z8673 Personal history of transient ischemic attack (TIA), and cerebral infarction without residual deficits: Secondary | ICD-10-CM | POA: Diagnosis not present

## 2020-05-03 DIAGNOSIS — S22079D Unspecified fracture of T9-T10 vertebra, subsequent encounter for fracture with routine healing: Secondary | ICD-10-CM

## 2020-05-03 DIAGNOSIS — Z7984 Long term (current) use of oral hypoglycemic drugs: Secondary | ICD-10-CM | POA: Diagnosis not present

## 2020-05-04 DIAGNOSIS — C44329 Squamous cell carcinoma of skin of other parts of face: Secondary | ICD-10-CM | POA: Diagnosis not present

## 2020-05-06 DIAGNOSIS — E119 Type 2 diabetes mellitus without complications: Secondary | ICD-10-CM | POA: Diagnosis not present

## 2020-05-06 DIAGNOSIS — I509 Heart failure, unspecified: Secondary | ICD-10-CM | POA: Diagnosis not present

## 2020-05-06 DIAGNOSIS — S22079D Unspecified fracture of T9-T10 vertebra, subsequent encounter for fracture with routine healing: Secondary | ICD-10-CM | POA: Diagnosis not present

## 2020-05-06 DIAGNOSIS — Z8673 Personal history of transient ischemic attack (TIA), and cerebral infarction without residual deficits: Secondary | ICD-10-CM | POA: Diagnosis not present

## 2020-05-06 DIAGNOSIS — I110 Hypertensive heart disease with heart failure: Secondary | ICD-10-CM | POA: Diagnosis not present

## 2020-05-06 DIAGNOSIS — C44329 Squamous cell carcinoma of skin of other parts of face: Secondary | ICD-10-CM | POA: Diagnosis not present

## 2020-05-10 DIAGNOSIS — I509 Heart failure, unspecified: Secondary | ICD-10-CM | POA: Diagnosis not present

## 2020-05-10 DIAGNOSIS — C44329 Squamous cell carcinoma of skin of other parts of face: Secondary | ICD-10-CM | POA: Diagnosis not present

## 2020-05-10 DIAGNOSIS — I110 Hypertensive heart disease with heart failure: Secondary | ICD-10-CM | POA: Diagnosis not present

## 2020-05-10 DIAGNOSIS — E119 Type 2 diabetes mellitus without complications: Secondary | ICD-10-CM | POA: Diagnosis not present

## 2020-05-10 DIAGNOSIS — Z8673 Personal history of transient ischemic attack (TIA), and cerebral infarction without residual deficits: Secondary | ICD-10-CM | POA: Diagnosis not present

## 2020-05-10 DIAGNOSIS — S22079D Unspecified fracture of T9-T10 vertebra, subsequent encounter for fracture with routine healing: Secondary | ICD-10-CM | POA: Diagnosis not present

## 2020-05-13 DIAGNOSIS — I110 Hypertensive heart disease with heart failure: Secondary | ICD-10-CM | POA: Diagnosis not present

## 2020-05-13 DIAGNOSIS — E119 Type 2 diabetes mellitus without complications: Secondary | ICD-10-CM | POA: Diagnosis not present

## 2020-05-13 DIAGNOSIS — Z8673 Personal history of transient ischemic attack (TIA), and cerebral infarction without residual deficits: Secondary | ICD-10-CM | POA: Diagnosis not present

## 2020-05-13 DIAGNOSIS — I509 Heart failure, unspecified: Secondary | ICD-10-CM | POA: Diagnosis not present

## 2020-05-13 DIAGNOSIS — C44329 Squamous cell carcinoma of skin of other parts of face: Secondary | ICD-10-CM | POA: Diagnosis not present

## 2020-05-13 DIAGNOSIS — S22079D Unspecified fracture of T9-T10 vertebra, subsequent encounter for fracture with routine healing: Secondary | ICD-10-CM | POA: Diagnosis not present

## 2020-05-16 DIAGNOSIS — Z7901 Long term (current) use of anticoagulants: Secondary | ICD-10-CM | POA: Diagnosis not present

## 2020-05-16 DIAGNOSIS — C44329 Squamous cell carcinoma of skin of other parts of face: Secondary | ICD-10-CM | POA: Diagnosis not present

## 2020-05-16 DIAGNOSIS — Z9181 History of falling: Secondary | ICD-10-CM | POA: Diagnosis not present

## 2020-05-16 DIAGNOSIS — Z794 Long term (current) use of insulin: Secondary | ICD-10-CM | POA: Diagnosis not present

## 2020-05-16 DIAGNOSIS — E119 Type 2 diabetes mellitus without complications: Secondary | ICD-10-CM | POA: Diagnosis not present

## 2020-05-16 DIAGNOSIS — Z8673 Personal history of transient ischemic attack (TIA), and cerebral infarction without residual deficits: Secondary | ICD-10-CM | POA: Diagnosis not present

## 2020-05-16 DIAGNOSIS — I110 Hypertensive heart disease with heart failure: Secondary | ICD-10-CM | POA: Diagnosis not present

## 2020-05-16 DIAGNOSIS — I509 Heart failure, unspecified: Secondary | ICD-10-CM | POA: Diagnosis not present

## 2020-05-16 DIAGNOSIS — Z7984 Long term (current) use of oral hypoglycemic drugs: Secondary | ICD-10-CM | POA: Diagnosis not present

## 2020-05-16 DIAGNOSIS — S22079D Unspecified fracture of T9-T10 vertebra, subsequent encounter for fracture with routine healing: Secondary | ICD-10-CM | POA: Diagnosis not present

## 2020-05-17 DIAGNOSIS — Z4802 Encounter for removal of sutures: Secondary | ICD-10-CM | POA: Diagnosis not present

## 2020-05-20 DIAGNOSIS — I509 Heart failure, unspecified: Secondary | ICD-10-CM | POA: Diagnosis not present

## 2020-05-20 DIAGNOSIS — I110 Hypertensive heart disease with heart failure: Secondary | ICD-10-CM | POA: Diagnosis not present

## 2020-05-20 DIAGNOSIS — C44329 Squamous cell carcinoma of skin of other parts of face: Secondary | ICD-10-CM | POA: Diagnosis not present

## 2020-05-20 DIAGNOSIS — Z8673 Personal history of transient ischemic attack (TIA), and cerebral infarction without residual deficits: Secondary | ICD-10-CM | POA: Diagnosis not present

## 2020-05-20 DIAGNOSIS — E119 Type 2 diabetes mellitus without complications: Secondary | ICD-10-CM | POA: Diagnosis not present

## 2020-05-20 DIAGNOSIS — S22079D Unspecified fracture of T9-T10 vertebra, subsequent encounter for fracture with routine healing: Secondary | ICD-10-CM | POA: Diagnosis not present

## 2020-05-25 DIAGNOSIS — I509 Heart failure, unspecified: Secondary | ICD-10-CM | POA: Diagnosis not present

## 2020-05-25 DIAGNOSIS — Z8673 Personal history of transient ischemic attack (TIA), and cerebral infarction without residual deficits: Secondary | ICD-10-CM | POA: Diagnosis not present

## 2020-05-25 DIAGNOSIS — E119 Type 2 diabetes mellitus without complications: Secondary | ICD-10-CM | POA: Diagnosis not present

## 2020-05-25 DIAGNOSIS — C44329 Squamous cell carcinoma of skin of other parts of face: Secondary | ICD-10-CM | POA: Diagnosis not present

## 2020-05-25 DIAGNOSIS — I110 Hypertensive heart disease with heart failure: Secondary | ICD-10-CM | POA: Diagnosis not present

## 2020-05-25 DIAGNOSIS — S22079D Unspecified fracture of T9-T10 vertebra, subsequent encounter for fracture with routine healing: Secondary | ICD-10-CM | POA: Diagnosis not present

## 2020-05-26 ENCOUNTER — Ambulatory Visit: Payer: Medicare Other | Admitting: Family Medicine

## 2020-05-26 ENCOUNTER — Other Ambulatory Visit: Payer: Self-pay | Admitting: Family Medicine

## 2020-05-27 DIAGNOSIS — S22079D Unspecified fracture of T9-T10 vertebra, subsequent encounter for fracture with routine healing: Secondary | ICD-10-CM | POA: Diagnosis not present

## 2020-05-27 DIAGNOSIS — Z8673 Personal history of transient ischemic attack (TIA), and cerebral infarction without residual deficits: Secondary | ICD-10-CM | POA: Diagnosis not present

## 2020-05-27 DIAGNOSIS — C44329 Squamous cell carcinoma of skin of other parts of face: Secondary | ICD-10-CM | POA: Diagnosis not present

## 2020-05-27 DIAGNOSIS — I509 Heart failure, unspecified: Secondary | ICD-10-CM | POA: Diagnosis not present

## 2020-05-27 DIAGNOSIS — I110 Hypertensive heart disease with heart failure: Secondary | ICD-10-CM | POA: Diagnosis not present

## 2020-05-27 DIAGNOSIS — E119 Type 2 diabetes mellitus without complications: Secondary | ICD-10-CM | POA: Diagnosis not present

## 2020-06-01 DIAGNOSIS — S22079D Unspecified fracture of T9-T10 vertebra, subsequent encounter for fracture with routine healing: Secondary | ICD-10-CM | POA: Diagnosis not present

## 2020-06-01 DIAGNOSIS — E119 Type 2 diabetes mellitus without complications: Secondary | ICD-10-CM | POA: Diagnosis not present

## 2020-06-01 DIAGNOSIS — Z8673 Personal history of transient ischemic attack (TIA), and cerebral infarction without residual deficits: Secondary | ICD-10-CM | POA: Diagnosis not present

## 2020-06-01 DIAGNOSIS — I110 Hypertensive heart disease with heart failure: Secondary | ICD-10-CM | POA: Diagnosis not present

## 2020-06-01 DIAGNOSIS — C44329 Squamous cell carcinoma of skin of other parts of face: Secondary | ICD-10-CM | POA: Diagnosis not present

## 2020-06-01 DIAGNOSIS — I509 Heart failure, unspecified: Secondary | ICD-10-CM | POA: Diagnosis not present

## 2020-06-03 ENCOUNTER — Encounter: Payer: Self-pay | Admitting: Family Medicine

## 2020-06-03 ENCOUNTER — Ambulatory Visit (INDEPENDENT_AMBULATORY_CARE_PROVIDER_SITE_OTHER): Payer: Medicare Other | Admitting: Family Medicine

## 2020-06-03 ENCOUNTER — Other Ambulatory Visit: Payer: Self-pay

## 2020-06-03 VITALS — BP 120/69 | HR 83 | Temp 97.6°F | Resp 20 | Ht 71.0 in | Wt 225.1 lb

## 2020-06-03 DIAGNOSIS — I25118 Atherosclerotic heart disease of native coronary artery with other forms of angina pectoris: Secondary | ICD-10-CM | POA: Diagnosis not present

## 2020-06-03 DIAGNOSIS — D508 Other iron deficiency anemias: Secondary | ICD-10-CM

## 2020-06-03 DIAGNOSIS — E782 Mixed hyperlipidemia: Secondary | ICD-10-CM

## 2020-06-03 DIAGNOSIS — Z794 Long term (current) use of insulin: Secondary | ICD-10-CM

## 2020-06-03 DIAGNOSIS — I5022 Chronic systolic (congestive) heart failure: Secondary | ICD-10-CM | POA: Diagnosis not present

## 2020-06-03 DIAGNOSIS — E1159 Type 2 diabetes mellitus with other circulatory complications: Secondary | ICD-10-CM | POA: Diagnosis not present

## 2020-06-03 LAB — BAYER DCA HB A1C WAIVED: HB A1C (BAYER DCA - WAIVED): 6.5 % (ref <7.0)

## 2020-06-03 NOTE — Progress Notes (Signed)
[facility],  Your lab result is normal and/or stable.Some minor variations that are not significant are commonly marked abnormal, but do not represent any medical problem for you.  Best regards, Claretta Fraise, M.D.

## 2020-06-03 NOTE — Progress Notes (Signed)
Subjective:  Patient ID: Edward Heath,  male    DOB: Oct 07, 1945  Age: 75 y.o.    CC: Medical Management of Chronic Issues   HPI Fadil D Ahlquist presents for  follow-up of hypertension. Patient has no history of headache chest pain or shortness of breath or recent cough. Patient also denies symptoms of TIA such as numbness weakness lateralizing. Patient denies side effects from medication. States taking it regularly.  Patient also  in for follow-up of elevated cholesterol. Doing well without complaints on current medication. Denies side effects  including myalgia and arthralgia and nausea. Also in today for liver function testing. Currently no chest pain, shortness of breath or other cardiovascular related symptoms noted.  Follow-up of diabetes. Patient does check blood sugar at home. Readings run between 120 and up to 200 Patient denies symptoms such as excessive hunger or urinary frequency, excessive hunger, nausea No significant hypoglycemic spells noted. Medications reviewed. Pt reports taking them regularly. Pt. denies complication/adverse reaction today.    History Dorr has a past medical history of AICD (automatic cardioverter/defibrillator) present, Arthritis, CHF (congestive heart failure) ([place]), Coronary artery disease, Diabetes mellitus without complication ([place]), Dysrhythmia, GERD (gastroesophageal reflux disease), Heart attack ([place]) (2004, 2015), History of kidney stones, Hyperlipidemia, Hypertension, Hypothyroidism, Lumbar vertebral fracture ([place]) (2012), NSTEMI, initial episode of care [facility]) (10/09/2013), Skin cancer (06/2014), Stroke [facility]), Thyroid disease, and TIA (transient ischemic attack) (2009).   He has a past surgical history that includes Cardiac defibrillator placement (12/2003, Repaired in 2012); Skin cancer excision (06/2014); Knee surgery (Right, 1994); and Ulnar nerve transposition (Left, 09/05/2019).   His family history includes Aneurysm in his father; Heart  attack in his mother; Heart attack (age of onset: 95) in his brother.He reports that he has never smoked. His smokeless tobacco use includes chew. He reports current alcohol use. He reports that he does not use drugs.  Current Outpatient Medications on File Prior to Visit  Medication Sig Dispense Refill  . acetaminophen (TYLENOL) 650 MG CR tablet Take 1,300 mg by mouth every 6 (six) hours as needed for pain.    Marland Kitchen amiodarone (PACERONE) 200 MG tablet Take 200 mg by mouth daily.    Marland Kitchen apixaban (ELIQUIS) 5 MG TABS tablet Take 1 tablet (5 mg total) by mouth 2 (two) times daily. 2 BID X 7 days then 1 BID (Patient taking differently: Take 5 mg by mouth daily.) 75 tablet 0  . atorvastatin (LIPITOR) 80 MG tablet Take 40 mg by mouth daily.    . Cholecalciferol (VITAMIN D-1000 MAX ST) 1000 units tablet Take 1 tablet (1,000 Units total) by mouth 2 (two) times daily. 60 tablet 11  . Continuous Blood Gluc Receiver (FREESTYLE LIBRE 14 DAY READER) DEVI 1 each by Does not apply route daily. 1 each 0  . cyanocobalamin 1000 MCG tablet Take 1,000 mcg by mouth daily.    . ferrous sulfate 325 (65 FE) MG tablet Take 325 mg by mouth daily with breakfast.     . furosemide (LASIX) 40 MG tablet Take 1 tablet (40 mg total) by mouth 2 (two) times daily. (Patient taking differently: Take 40 mg by mouth daily.) 60 tablet 0  . gabapentin (NEURONTIN) 300 MG capsule Take 600 mg by mouth 3 (three) times daily.    . insulin aspart (NOVOLOG FLEXPEN) 100 UNIT/ML FlexPen Inject 22 Units into the skin in the morning and at bedtime.    . insulin aspart protamine- aspart (NOVOLOG MIX 70/30) (70-30) 100 UNIT/ML injection Inject 0.2  mLs (20 Units total) into the skin 2 (two) times daily with a meal. Inject 16 units into the skin in the morning and in the evening. 20 mL 11  . insulin glargine (LANTUS) 100 UNIT/ML injection Inject 0.55 mLs (55 Units total) into the skin at bedtime. (Patient taking differently: Inject 40 Units into the skin at  bedtime.) 10 mL 2  . levothyroxine (SYNTHROID, LEVOTHROID) 125 MCG tablet Take 125 mcg by mouth daily before breakfast.    . loratadine (CLARITIN) 10 MG tablet Take 10 mg by mouth daily.    . Magnesium 500 MG TABS Take 400 mg by mouth daily.    . melatonin 3 MG TABS tablet Take 3 mg by mouth at bedtime.    . metFORMIN (GLUCOPHAGE) 1000 MG tablet Take 1,000 mg by mouth 2 (two) times daily with a meal.     . metoprolol succinate (TOPROL-XL) 100 MG 24 hr tablet Take 1 tablet (100 mg total) by mouth daily. Take with or immediately following a meal. 90 tablet 0  . nitroGLYCERIN (NITROSTAT) 0.4 MG SL tablet Place 0.4 mg under the tongue every 5 (five) minutes as needed for chest pain.     Marland Kitchen omeprazole (PRILOSEC) 20 MG capsule Take 20 mg by mouth 2 (two) times daily.    . sacubitril-valsartan (ENTRESTO) 24-26 MG Take 1 tablet by mouth 2 (two) times daily.     No current facility-administered medications on file prior to visit.    ROS Review of Systems  Constitutional: Negative for fever.  Respiratory: Negative for shortness of breath.   Cardiovascular: Negative for chest pain.  Musculoskeletal: Negative for arthralgias.  Skin: Negative for rash.    Objective:  BP 120/69   Pulse 83   Temp 97.6 F (36.4 C) (Temporal)   Resp 20   Ht '5\' 11"'  (1.803 m)   Wt 225 lb 2 oz (102.1 kg)   SpO2 93%   BMI 31.40 kg/m   BP Readings from Last 3 Encounters:  06/03/20 120/69  11/11/19 (!) 115/59  09/05/19 (!) 123/54    Wt Readings from Last 3 Encounters:  06/03/20 225 lb 2 oz (102.1 kg)  11/11/19 270 lb (122.5 kg)  09/18/19 270 lb (122.5 kg)     Physical Exam Constitutional:      General: He is not in acute distress.    Appearance: He is well-developed.  HENT:     Head: Normocephalic and atraumatic.     Right Ear: External ear normal.     Left Ear: External ear normal.     Nose: Nose normal.  Eyes:     Conjunctiva/sclera: Conjunctivae normal.     Pupils: Pupils are equal, round, and  reactive to light.  Cardiovascular:     Rate and Rhythm: Normal rate and regular rhythm.     Heart sounds: Normal heart sounds. No murmur heard.   Pulmonary:     Effort: Pulmonary effort is normal. No respiratory distress.     Breath sounds: Normal breath sounds. No wheezing or rales.  Abdominal:     Palpations: Abdomen is soft.     Tenderness: There is no abdominal tenderness.  Musculoskeletal:        General: Normal range of motion.     Cervical back: Normal range of motion and neck supple.  Skin:    General: Skin is warm and dry.  Neurological:     Mental Status: He is alert and oriented to person, place, and time.  Deep Tendon Reflexes: Reflexes are normal and symmetric.  Psychiatric:        Behavior: Behavior normal.        Thought Content: Thought content normal.        Judgment: Judgment normal.         Assessment & Plan:   Naseem was seen today for medical management of chronic issues.  Diagnoses and all orders for this visit:  Type 2 diabetes mellitus with other circulatory complication, with long-term current use of insulin (HCC) -     Microalbumin / creatinine urine ratio -     Bayer DCA Hb A1c Waived -     CBC with Differential/Platelet -     CMP14+EGFR -     Lipid panel -     Thyroid Panel With TSH  Other iron deficiency anemia  Coronary artery disease of native heart with stable angina pectoris, unspecified vessel or lesion type (HCC)  Chronic systolic congestive heart failure (HCC)  Mixed hyperlipidemia  Other orders -     Continuous Blood Gluc Sensor (FREESTYLE LIBRE 14 DAY SENSOR) MISC; 1 each by Does not apply route every 14 (fourteen) days.   I have discontinued Jamal D. Goldenstein's aspirin EC, Omega-3, Potassium, vitamin E, benzonatate, albuterol, Oxcarbazepine, HYDROcodone-acetaminophen, and tiZANidine. I am also having him maintain his omeprazole, atorvastatin, Magnesium, metFORMIN, nitroGLYCERIN, gabapentin, Cholecalciferol, apixaban,  metoprolol succinate, ferrous sulfate, cyanocobalamin, levothyroxine, amiodarone, furosemide, loratadine, insulin glargine, FreeStyle Libre 14 Day Reader, insulin aspart protamine- aspart, NovoLOG FlexPen, sacubitril-valsartan, melatonin, acetaminophen, and FreeStyle Libre 14 Day Sensor.  Meds ordered this encounter  Medications  . Continuous Blood Gluc Sensor (FREESTYLE LIBRE 14 DAY SENSOR) MISC    Sig: 1 each by Does not apply route every 14 (fourteen) days.    Dispense:  6 each    Refill:  3     Follow-up: No follow-ups on file.  Claretta Fraise, M.D.

## 2020-06-04 DIAGNOSIS — C44329 Squamous cell carcinoma of skin of other parts of face: Secondary | ICD-10-CM | POA: Diagnosis not present

## 2020-06-04 DIAGNOSIS — Z8673 Personal history of transient ischemic attack (TIA), and cerebral infarction without residual deficits: Secondary | ICD-10-CM | POA: Diagnosis not present

## 2020-06-04 DIAGNOSIS — E119 Type 2 diabetes mellitus without complications: Secondary | ICD-10-CM | POA: Diagnosis not present

## 2020-06-04 DIAGNOSIS — I110 Hypertensive heart disease with heart failure: Secondary | ICD-10-CM | POA: Diagnosis not present

## 2020-06-04 DIAGNOSIS — I509 Heart failure, unspecified: Secondary | ICD-10-CM | POA: Diagnosis not present

## 2020-06-04 DIAGNOSIS — S22079D Unspecified fracture of T9-T10 vertebra, subsequent encounter for fracture with routine healing: Secondary | ICD-10-CM | POA: Diagnosis not present

## 2020-06-04 LAB — CBC WITH DIFFERENTIAL/PLATELET
Basophils Absolute: 0.1 10*3/uL (ref 0.0–0.2)
Basos: 1 %
EOS (ABSOLUTE): 0.3 10*3/uL (ref 0.0–0.4)
Eos: 3 %
Hematocrit: 42.4 % (ref 37.5–51.0)
Hemoglobin: 13.7 g/dL (ref 13.0–17.7)
Immature Grans (Abs): 0 10*3/uL (ref 0.0–0.1)
Immature Granulocytes: 0 %
Lymphocytes Absolute: 3.7 10*3/uL — ABNORMAL HIGH (ref 0.7–3.1)
Lymphs: 34 %
MCH: 29.4 pg (ref 26.6–33.0)
MCHC: 32.3 g/dL (ref 31.5–35.7)
MCV: 91 fL (ref 79–97)
Monocytes Absolute: 1 10*3/uL — ABNORMAL HIGH (ref 0.1–0.9)
Monocytes: 9 %
Neutrophils Absolute: 5.6 10*3/uL (ref 1.4–7.0)
Neutrophils: 53 %
Platelets: 335 10*3/uL (ref 150–450)
RBC: 4.66 x10E6/uL (ref 4.14–5.80)
RDW: 13.4 % (ref 11.6–15.4)
WBC: 10.7 10*3/uL (ref 3.4–10.8)

## 2020-06-04 LAB — CMP14+EGFR
ALT: 14 IU/L (ref 0–44)
AST: 20 IU/L (ref 0–40)
Albumin/Globulin Ratio: 1.2 (ref 1.2–2.2)
Albumin: 3.9 g/dL (ref 3.7–4.7)
Alkaline Phosphatase: 132 IU/L — ABNORMAL HIGH (ref 44–121)
BUN/Creatinine Ratio: 10 (ref 10–24)
BUN: 15 mg/dL (ref 8–27)
Bilirubin Total: 0.3 mg/dL (ref 0.0–1.2)
CO2: 23 mmol/L (ref 20–29)
Calcium: 9.7 mg/dL (ref 8.6–10.2)
Chloride: 100 mmol/L (ref 96–106)
Creatinine, Ser: 1.57 mg/dL — ABNORMAL HIGH (ref 0.76–1.27)
GFR calc Af Amer: 49 mL/min/{1.73_m2} — ABNORMAL LOW (ref >59)
GFR calc non Af Amer: 42 mL/min/{1.73_m2} — ABNORMAL LOW (ref >59)
Globulin, Total: 3.3 g/dL (ref 1.5–4.5)
Glucose: 145 mg/dL — ABNORMAL HIGH (ref 65–99)
Potassium: 4.6 mmol/L (ref 3.5–5.2)
Sodium: 143 mmol/L (ref 134–144)
Total Protein: 7.2 g/dL (ref 6.0–8.5)

## 2020-06-04 LAB — LIPID PANEL
Chol/HDL Ratio: 4.8 ratio (ref 0.0–5.0)
Cholesterol, Total: 225 mg/dL — ABNORMAL HIGH (ref 100–199)
HDL: 47 mg/dL (ref >39)
LDL Chol Calc (NIH): 135 mg/dL — ABNORMAL HIGH (ref 0–99)
Triglycerides: 238 mg/dL — ABNORMAL HIGH (ref 0–149)
VLDL Cholesterol Cal: 43 mg/dL — ABNORMAL HIGH (ref 5–40)

## 2020-06-04 LAB — MICROALBUMIN / CREATININE URINE RATIO
Creatinine, Urine: 160.8 mg/dL
Microalb/Creat Ratio: 51 mg/g creat — ABNORMAL HIGH (ref 0–29)
Microalbumin, Urine: 82.1 ug/mL

## 2020-06-04 LAB — THYROID PANEL WITH TSH
Free Thyroxine Index: 4.9 (ref 1.2–4.9)
T3 Uptake Ratio: 32 % (ref 24–39)
T4, Total: 15.2 ug/dL — ABNORMAL HIGH (ref 4.5–12.0)
TSH: 2.18 u[IU]/mL (ref 0.450–4.500)

## 2020-06-06 ENCOUNTER — Encounter: Payer: Self-pay | Admitting: Family Medicine

## 2020-06-06 MED ORDER — FREESTYLE LIBRE 14 DAY SENSOR MISC: 1.0000 | 3 refills | Status: DC

## 2020-06-08 DIAGNOSIS — E119 Type 2 diabetes mellitus without complications: Secondary | ICD-10-CM | POA: Diagnosis not present

## 2020-06-08 DIAGNOSIS — I110 Hypertensive heart disease with heart failure: Secondary | ICD-10-CM | POA: Diagnosis not present

## 2020-06-08 DIAGNOSIS — Z8673 Personal history of transient ischemic attack (TIA), and cerebral infarction without residual deficits: Secondary | ICD-10-CM | POA: Diagnosis not present

## 2020-06-08 DIAGNOSIS — S22079D Unspecified fracture of T9-T10 vertebra, subsequent encounter for fracture with routine healing: Secondary | ICD-10-CM | POA: Diagnosis not present

## 2020-06-08 DIAGNOSIS — C44329 Squamous cell carcinoma of skin of other parts of face: Secondary | ICD-10-CM | POA: Diagnosis not present

## 2020-06-08 DIAGNOSIS — I509 Heart failure, unspecified: Secondary | ICD-10-CM | POA: Diagnosis not present

## 2020-06-10 DIAGNOSIS — I110 Hypertensive heart disease with heart failure: Secondary | ICD-10-CM | POA: Diagnosis not present

## 2020-06-10 DIAGNOSIS — I509 Heart failure, unspecified: Secondary | ICD-10-CM | POA: Diagnosis not present

## 2020-06-10 DIAGNOSIS — C44329 Squamous cell carcinoma of skin of other parts of face: Secondary | ICD-10-CM | POA: Diagnosis not present

## 2020-06-10 DIAGNOSIS — Z8673 Personal history of transient ischemic attack (TIA), and cerebral infarction without residual deficits: Secondary | ICD-10-CM | POA: Diagnosis not present

## 2020-06-10 DIAGNOSIS — S22079D Unspecified fracture of T9-T10 vertebra, subsequent encounter for fracture with routine healing: Secondary | ICD-10-CM | POA: Diagnosis not present

## 2020-06-10 DIAGNOSIS — E119 Type 2 diabetes mellitus without complications: Secondary | ICD-10-CM | POA: Diagnosis not present

## 2020-06-15 DIAGNOSIS — E1165 Type 2 diabetes mellitus with hyperglycemia: Secondary | ICD-10-CM | POA: Diagnosis not present

## 2020-06-15 DIAGNOSIS — I951 Orthostatic hypotension: Secondary | ICD-10-CM | POA: Diagnosis not present

## 2020-06-15 DIAGNOSIS — K449 Diaphragmatic hernia without obstruction or gangrene: Secondary | ICD-10-CM | POA: Diagnosis not present

## 2020-06-15 DIAGNOSIS — Z87442 Personal history of urinary calculi: Secondary | ICD-10-CM | POA: Diagnosis not present

## 2020-06-15 DIAGNOSIS — Z7901 Long term (current) use of anticoagulants: Secondary | ICD-10-CM | POA: Diagnosis not present

## 2020-06-15 DIAGNOSIS — L570 Actinic keratosis: Secondary | ICD-10-CM | POA: Diagnosis not present

## 2020-06-15 DIAGNOSIS — S22079D Unspecified fracture of T9-T10 vertebra, subsequent encounter for fracture with routine healing: Secondary | ICD-10-CM | POA: Diagnosis not present

## 2020-06-15 DIAGNOSIS — Z794 Long term (current) use of insulin: Secondary | ICD-10-CM | POA: Diagnosis not present

## 2020-06-15 DIAGNOSIS — H9190 Unspecified hearing loss, unspecified ear: Secondary | ICD-10-CM | POA: Diagnosis not present

## 2020-06-15 DIAGNOSIS — I110 Hypertensive heart disease with heart failure: Secondary | ICD-10-CM | POA: Diagnosis not present

## 2020-06-15 DIAGNOSIS — F17290 Nicotine dependence, other tobacco product, uncomplicated: Secondary | ICD-10-CM | POA: Diagnosis not present

## 2020-06-15 DIAGNOSIS — Z8673 Personal history of transient ischemic attack (TIA), and cerebral infarction without residual deficits: Secondary | ICD-10-CM | POA: Diagnosis not present

## 2020-06-15 DIAGNOSIS — Z7984 Long term (current) use of oral hypoglycemic drugs: Secondary | ICD-10-CM | POA: Diagnosis not present

## 2020-06-15 DIAGNOSIS — E785 Hyperlipidemia, unspecified: Secondary | ICD-10-CM | POA: Diagnosis not present

## 2020-06-15 DIAGNOSIS — K2270 Barrett's esophagus without dysplasia: Secondary | ICD-10-CM | POA: Diagnosis not present

## 2020-06-15 DIAGNOSIS — I2510 Atherosclerotic heart disease of native coronary artery without angina pectoris: Secondary | ICD-10-CM | POA: Diagnosis not present

## 2020-06-15 DIAGNOSIS — M1990 Unspecified osteoarthritis, unspecified site: Secondary | ICD-10-CM | POA: Diagnosis not present

## 2020-06-15 DIAGNOSIS — K259 Gastric ulcer, unspecified as acute or chronic, without hemorrhage or perforation: Secondary | ICD-10-CM | POA: Diagnosis not present

## 2020-06-15 DIAGNOSIS — K222 Esophageal obstruction: Secondary | ICD-10-CM | POA: Diagnosis not present

## 2020-06-15 DIAGNOSIS — Z981 Arthrodesis status: Secondary | ICD-10-CM | POA: Diagnosis not present

## 2020-06-15 DIAGNOSIS — I4891 Unspecified atrial fibrillation: Secondary | ICD-10-CM | POA: Diagnosis not present

## 2020-06-15 DIAGNOSIS — C44329 Squamous cell carcinoma of skin of other parts of face: Secondary | ICD-10-CM | POA: Diagnosis not present

## 2020-06-15 DIAGNOSIS — I509 Heart failure, unspecified: Secondary | ICD-10-CM | POA: Diagnosis not present

## 2020-06-15 DIAGNOSIS — I255 Ischemic cardiomyopathy: Secondary | ICD-10-CM | POA: Diagnosis not present

## 2020-06-15 DIAGNOSIS — Z9181 History of falling: Secondary | ICD-10-CM | POA: Diagnosis not present

## 2020-06-17 DIAGNOSIS — M4814 Ankylosing hyperostosis [Forestier], thoracic region: Secondary | ICD-10-CM | POA: Diagnosis not present

## 2020-06-17 DIAGNOSIS — Z981 Arthrodesis status: Secondary | ICD-10-CM | POA: Diagnosis not present

## 2020-06-17 DIAGNOSIS — Z950 Presence of cardiac pacemaker: Secondary | ICD-10-CM | POA: Diagnosis not present

## 2020-06-17 DIAGNOSIS — Z9889 Other specified postprocedural states: Secondary | ICD-10-CM | POA: Diagnosis not present

## 2020-06-17 DIAGNOSIS — S22078D Other fracture of T9-T10 vertebra, subsequent encounter for fracture with routine healing: Secondary | ICD-10-CM | POA: Diagnosis not present

## 2020-06-18 DIAGNOSIS — S22079D Unspecified fracture of T9-T10 vertebra, subsequent encounter for fracture with routine healing: Secondary | ICD-10-CM | POA: Diagnosis not present

## 2020-06-18 DIAGNOSIS — I509 Heart failure, unspecified: Secondary | ICD-10-CM | POA: Diagnosis not present

## 2020-06-18 DIAGNOSIS — I4891 Unspecified atrial fibrillation: Secondary | ICD-10-CM | POA: Diagnosis not present

## 2020-06-18 DIAGNOSIS — E1165 Type 2 diabetes mellitus with hyperglycemia: Secondary | ICD-10-CM | POA: Diagnosis not present

## 2020-06-18 DIAGNOSIS — I255 Ischemic cardiomyopathy: Secondary | ICD-10-CM | POA: Diagnosis not present

## 2020-06-18 DIAGNOSIS — I110 Hypertensive heart disease with heart failure: Secondary | ICD-10-CM | POA: Diagnosis not present

## 2020-06-21 ENCOUNTER — Telehealth: Payer: Self-pay | Admitting: *Deleted

## 2020-06-21 NOTE — Telephone Encounter (Signed)
DYI: VM from [facility] PT w/ [facility] Saw pt last Friday, pt fell Thursday pm, had pain in right knee, not severe, this decrease after exercise

## 2020-06-22 DIAGNOSIS — I110 Hypertensive heart disease with heart failure: Secondary | ICD-10-CM | POA: Diagnosis not present

## 2020-06-22 DIAGNOSIS — I255 Ischemic cardiomyopathy: Secondary | ICD-10-CM | POA: Diagnosis not present

## 2020-06-22 DIAGNOSIS — I4891 Unspecified atrial fibrillation: Secondary | ICD-10-CM | POA: Diagnosis not present

## 2020-06-22 DIAGNOSIS — E1165 Type 2 diabetes mellitus with hyperglycemia: Secondary | ICD-10-CM | POA: Diagnosis not present

## 2020-06-22 DIAGNOSIS — S22079D Unspecified fracture of T9-T10 vertebra, subsequent encounter for fracture with routine healing: Secondary | ICD-10-CM | POA: Diagnosis not present

## 2020-06-22 DIAGNOSIS — I509 Heart failure, unspecified: Secondary | ICD-10-CM | POA: Diagnosis not present

## 2020-06-23 ENCOUNTER — Telehealth: Payer: Self-pay | Admitting: *Deleted

## 2020-06-23 NOTE — Telephone Encounter (Signed)
FYI: VM from [place] PT with [facility] Saw pt yesterday, he reported a fall the day before when getting on the scales C/o left knee pain, initially had difficulty walking, but was able to do PT and will be seen again on Thursday.

## 2020-06-24 ENCOUNTER — Telehealth: Payer: Self-pay

## 2020-06-24 DIAGNOSIS — S22079D Unspecified fracture of T9-T10 vertebra, subsequent encounter for fracture with routine healing: Secondary | ICD-10-CM | POA: Diagnosis not present

## 2020-06-24 DIAGNOSIS — I4891 Unspecified atrial fibrillation: Secondary | ICD-10-CM | POA: Diagnosis not present

## 2020-06-24 DIAGNOSIS — I110 Hypertensive heart disease with heart failure: Secondary | ICD-10-CM | POA: Diagnosis not present

## 2020-06-24 DIAGNOSIS — E1165 Type 2 diabetes mellitus with hyperglycemia: Secondary | ICD-10-CM | POA: Diagnosis not present

## 2020-06-24 DIAGNOSIS — I255 Ischemic cardiomyopathy: Secondary | ICD-10-CM | POA: Diagnosis not present

## 2020-06-24 DIAGNOSIS — I509 Heart failure, unspecified: Secondary | ICD-10-CM | POA: Diagnosis not present

## 2020-06-24 NOTE — Telephone Encounter (Signed)
CORRECTION to previous message: The physical therapist says pt needs orders for 2 knee braces (one for each knee) sent to [facility].  Please call patients wife with update on this.

## 2020-06-24 NOTE — Telephone Encounter (Signed)
Pts wife called stating that pt has PT today and was told that they need Dr Livia Snellen to order a knee brace for pt and send it to [facility].

## 2020-06-29 DIAGNOSIS — I255 Ischemic cardiomyopathy: Secondary | ICD-10-CM | POA: Diagnosis not present

## 2020-06-29 DIAGNOSIS — I4891 Unspecified atrial fibrillation: Secondary | ICD-10-CM | POA: Diagnosis not present

## 2020-06-29 DIAGNOSIS — S22079D Unspecified fracture of T9-T10 vertebra, subsequent encounter for fracture with routine healing: Secondary | ICD-10-CM | POA: Diagnosis not present

## 2020-06-29 DIAGNOSIS — I509 Heart failure, unspecified: Secondary | ICD-10-CM | POA: Diagnosis not present

## 2020-06-29 DIAGNOSIS — E1165 Type 2 diabetes mellitus with hyperglycemia: Secondary | ICD-10-CM | POA: Diagnosis not present

## 2020-06-29 DIAGNOSIS — I110 Hypertensive heart disease with heart failure: Secondary | ICD-10-CM | POA: Diagnosis not present

## 2020-06-29 NOTE — Telephone Encounter (Signed)
Pts wife called to get update on whether Dr Livia Snellen has sent in order to [facility] for pt to get knee braces per Physical Therapist.  I spoke with the Physical Therapist Fanny Skates) who stated that she sent the request via fax last week for knee braces.  Pts wife needs orders sent to [facility] ASAP.  (Wife is aware that with [facility], pt's insurance wont cover knee braces. Wife is ok with that.)  Please call wife when order has been sent.

## 2020-06-30 NOTE — Telephone Encounter (Signed)
Closing encounter, per chanda this was faxed on Friday, Monday and this morning

## 2020-06-30 NOTE — Telephone Encounter (Signed)
Edward Heath, has this been faxed

## 2020-06-30 NOTE — Telephone Encounter (Signed)
It was faxed last week and again yesterday.

## 2020-07-01 DIAGNOSIS — S22079D Unspecified fracture of T9-T10 vertebra, subsequent encounter for fracture with routine healing: Secondary | ICD-10-CM | POA: Diagnosis not present

## 2020-07-01 DIAGNOSIS — I110 Hypertensive heart disease with heart failure: Secondary | ICD-10-CM | POA: Diagnosis not present

## 2020-07-01 DIAGNOSIS — I4891 Unspecified atrial fibrillation: Secondary | ICD-10-CM | POA: Diagnosis not present

## 2020-07-01 DIAGNOSIS — E1165 Type 2 diabetes mellitus with hyperglycemia: Secondary | ICD-10-CM | POA: Diagnosis not present

## 2020-07-01 DIAGNOSIS — I255 Ischemic cardiomyopathy: Secondary | ICD-10-CM | POA: Diagnosis not present

## 2020-07-01 DIAGNOSIS — I509 Heart failure, unspecified: Secondary | ICD-10-CM | POA: Diagnosis not present

## 2020-07-06 DIAGNOSIS — I509 Heart failure, unspecified: Secondary | ICD-10-CM | POA: Diagnosis not present

## 2020-07-06 DIAGNOSIS — S22079D Unspecified fracture of T9-T10 vertebra, subsequent encounter for fracture with routine healing: Secondary | ICD-10-CM | POA: Diagnosis not present

## 2020-07-06 DIAGNOSIS — I255 Ischemic cardiomyopathy: Secondary | ICD-10-CM | POA: Diagnosis not present

## 2020-07-06 DIAGNOSIS — E1165 Type 2 diabetes mellitus with hyperglycemia: Secondary | ICD-10-CM | POA: Diagnosis not present

## 2020-07-06 DIAGNOSIS — I4891 Unspecified atrial fibrillation: Secondary | ICD-10-CM | POA: Diagnosis not present

## 2020-07-06 DIAGNOSIS — I110 Hypertensive heart disease with heart failure: Secondary | ICD-10-CM | POA: Diagnosis not present

## 2020-07-08 DIAGNOSIS — S22079D Unspecified fracture of T9-T10 vertebra, subsequent encounter for fracture with routine healing: Secondary | ICD-10-CM | POA: Diagnosis not present

## 2020-07-08 DIAGNOSIS — E1165 Type 2 diabetes mellitus with hyperglycemia: Secondary | ICD-10-CM | POA: Diagnosis not present

## 2020-07-08 DIAGNOSIS — I110 Hypertensive heart disease with heart failure: Secondary | ICD-10-CM | POA: Diagnosis not present

## 2020-07-08 DIAGNOSIS — I255 Ischemic cardiomyopathy: Secondary | ICD-10-CM | POA: Diagnosis not present

## 2020-07-08 DIAGNOSIS — I509 Heart failure, unspecified: Secondary | ICD-10-CM | POA: Diagnosis not present

## 2020-07-08 DIAGNOSIS — I4891 Unspecified atrial fibrillation: Secondary | ICD-10-CM | POA: Diagnosis not present

## 2020-07-13 DIAGNOSIS — S22079D Unspecified fracture of T9-T10 vertebra, subsequent encounter for fracture with routine healing: Secondary | ICD-10-CM | POA: Diagnosis not present

## 2020-07-13 DIAGNOSIS — E1165 Type 2 diabetes mellitus with hyperglycemia: Secondary | ICD-10-CM | POA: Diagnosis not present

## 2020-07-13 DIAGNOSIS — I110 Hypertensive heart disease with heart failure: Secondary | ICD-10-CM | POA: Diagnosis not present

## 2020-07-13 DIAGNOSIS — I255 Ischemic cardiomyopathy: Secondary | ICD-10-CM | POA: Diagnosis not present

## 2020-07-13 DIAGNOSIS — I4891 Unspecified atrial fibrillation: Secondary | ICD-10-CM | POA: Diagnosis not present

## 2020-07-13 DIAGNOSIS — I509 Heart failure, unspecified: Secondary | ICD-10-CM | POA: Diagnosis not present

## 2020-07-14 ENCOUNTER — Ambulatory Visit (INDEPENDENT_AMBULATORY_CARE_PROVIDER_SITE_OTHER): Payer: Medicare Other

## 2020-07-14 ENCOUNTER — Other Ambulatory Visit: Payer: Self-pay

## 2020-07-14 DIAGNOSIS — Z23 Encounter for immunization: Secondary | ICD-10-CM

## 2020-07-14 NOTE — Progress Notes (Signed)
   Covid-19 Vaccination Clinic  Name:  Edward Heath    MRN: [id number] DOB: 06-Dec-1945  07/14/2020  Edward Heath was observed post Covid-19 immunization for 15 minutes without incident. He was provided with Vaccine Information Sheet and instruction to access the V-Safe system.   Edward Heath was instructed to call 911 with any severe reactions post vaccine: Marland Kitchen Difficulty breathing  . Swelling of face and throat  . A fast heartbeat  . A bad rash all over body  . Dizziness and weakness   Immunizations Administered    Name Date Dose VIS Date Route   Moderna Covid-19 Booster Vaccine 07/14/2020  9:14 AM 0.25 mL 03/10/2020 Intramuscular   Manufacturer: Moderna   Lot: [id number]   [place]: 20254-270-62

## 2020-07-15 DIAGNOSIS — Z9181 History of falling: Secondary | ICD-10-CM | POA: Diagnosis not present

## 2020-07-15 DIAGNOSIS — K2270 Barrett's esophagus without dysplasia: Secondary | ICD-10-CM | POA: Diagnosis not present

## 2020-07-15 DIAGNOSIS — I255 Ischemic cardiomyopathy: Secondary | ICD-10-CM | POA: Diagnosis not present

## 2020-07-15 DIAGNOSIS — Z7901 Long term (current) use of anticoagulants: Secondary | ICD-10-CM | POA: Diagnosis not present

## 2020-07-15 DIAGNOSIS — I951 Orthostatic hypotension: Secondary | ICD-10-CM | POA: Diagnosis not present

## 2020-07-15 DIAGNOSIS — K449 Diaphragmatic hernia without obstruction or gangrene: Secondary | ICD-10-CM | POA: Diagnosis not present

## 2020-07-15 DIAGNOSIS — H9190 Unspecified hearing loss, unspecified ear: Secondary | ICD-10-CM | POA: Diagnosis not present

## 2020-07-15 DIAGNOSIS — Z981 Arthrodesis status: Secondary | ICD-10-CM | POA: Diagnosis not present

## 2020-07-15 DIAGNOSIS — S22079D Unspecified fracture of T9-T10 vertebra, subsequent encounter for fracture with routine healing: Secondary | ICD-10-CM | POA: Diagnosis not present

## 2020-07-15 DIAGNOSIS — I110 Hypertensive heart disease with heart failure: Secondary | ICD-10-CM | POA: Diagnosis not present

## 2020-07-15 DIAGNOSIS — M1990 Unspecified osteoarthritis, unspecified site: Secondary | ICD-10-CM | POA: Diagnosis not present

## 2020-07-15 DIAGNOSIS — Z87442 Personal history of urinary calculi: Secondary | ICD-10-CM | POA: Diagnosis not present

## 2020-07-15 DIAGNOSIS — C44329 Squamous cell carcinoma of skin of other parts of face: Secondary | ICD-10-CM | POA: Diagnosis not present

## 2020-07-15 DIAGNOSIS — K222 Esophageal obstruction: Secondary | ICD-10-CM | POA: Diagnosis not present

## 2020-07-15 DIAGNOSIS — E785 Hyperlipidemia, unspecified: Secondary | ICD-10-CM | POA: Diagnosis not present

## 2020-07-15 DIAGNOSIS — Z7984 Long term (current) use of oral hypoglycemic drugs: Secondary | ICD-10-CM | POA: Diagnosis not present

## 2020-07-15 DIAGNOSIS — I2510 Atherosclerotic heart disease of native coronary artery without angina pectoris: Secondary | ICD-10-CM | POA: Diagnosis not present

## 2020-07-15 DIAGNOSIS — I4891 Unspecified atrial fibrillation: Secondary | ICD-10-CM | POA: Diagnosis not present

## 2020-07-15 DIAGNOSIS — E1165 Type 2 diabetes mellitus with hyperglycemia: Secondary | ICD-10-CM | POA: Diagnosis not present

## 2020-07-15 DIAGNOSIS — K259 Gastric ulcer, unspecified as acute or chronic, without hemorrhage or perforation: Secondary | ICD-10-CM | POA: Diagnosis not present

## 2020-07-15 DIAGNOSIS — I509 Heart failure, unspecified: Secondary | ICD-10-CM | POA: Diagnosis not present

## 2020-07-15 DIAGNOSIS — Z794 Long term (current) use of insulin: Secondary | ICD-10-CM | POA: Diagnosis not present

## 2020-07-15 DIAGNOSIS — F17290 Nicotine dependence, other tobacco product, uncomplicated: Secondary | ICD-10-CM | POA: Diagnosis not present

## 2020-07-15 DIAGNOSIS — Z8673 Personal history of transient ischemic attack (TIA), and cerebral infarction without residual deficits: Secondary | ICD-10-CM | POA: Diagnosis not present

## 2020-07-15 DIAGNOSIS — L570 Actinic keratosis: Secondary | ICD-10-CM | POA: Diagnosis not present

## 2020-07-20 DIAGNOSIS — I255 Ischemic cardiomyopathy: Secondary | ICD-10-CM | POA: Diagnosis not present

## 2020-07-20 DIAGNOSIS — I509 Heart failure, unspecified: Secondary | ICD-10-CM | POA: Diagnosis not present

## 2020-07-20 DIAGNOSIS — E1165 Type 2 diabetes mellitus with hyperglycemia: Secondary | ICD-10-CM | POA: Diagnosis not present

## 2020-07-20 DIAGNOSIS — S22079D Unspecified fracture of T9-T10 vertebra, subsequent encounter for fracture with routine healing: Secondary | ICD-10-CM | POA: Diagnosis not present

## 2020-07-20 DIAGNOSIS — I4891 Unspecified atrial fibrillation: Secondary | ICD-10-CM | POA: Diagnosis not present

## 2020-07-20 DIAGNOSIS — I110 Hypertensive heart disease with heart failure: Secondary | ICD-10-CM | POA: Diagnosis not present

## 2020-07-22 DIAGNOSIS — I255 Ischemic cardiomyopathy: Secondary | ICD-10-CM | POA: Diagnosis not present

## 2020-07-22 DIAGNOSIS — I110 Hypertensive heart disease with heart failure: Secondary | ICD-10-CM | POA: Diagnosis not present

## 2020-07-22 DIAGNOSIS — E1165 Type 2 diabetes mellitus with hyperglycemia: Secondary | ICD-10-CM | POA: Diagnosis not present

## 2020-07-22 DIAGNOSIS — S22079D Unspecified fracture of T9-T10 vertebra, subsequent encounter for fracture with routine healing: Secondary | ICD-10-CM | POA: Diagnosis not present

## 2020-07-22 DIAGNOSIS — I509 Heart failure, unspecified: Secondary | ICD-10-CM | POA: Diagnosis not present

## 2020-07-22 DIAGNOSIS — I4891 Unspecified atrial fibrillation: Secondary | ICD-10-CM | POA: Diagnosis not present

## 2020-07-27 DIAGNOSIS — I110 Hypertensive heart disease with heart failure: Secondary | ICD-10-CM | POA: Diagnosis not present

## 2020-07-27 DIAGNOSIS — I4891 Unspecified atrial fibrillation: Secondary | ICD-10-CM | POA: Diagnosis not present

## 2020-07-27 DIAGNOSIS — I255 Ischemic cardiomyopathy: Secondary | ICD-10-CM | POA: Diagnosis not present

## 2020-07-27 DIAGNOSIS — S22079D Unspecified fracture of T9-T10 vertebra, subsequent encounter for fracture with routine healing: Secondary | ICD-10-CM | POA: Diagnosis not present

## 2020-07-27 DIAGNOSIS — E1165 Type 2 diabetes mellitus with hyperglycemia: Secondary | ICD-10-CM | POA: Diagnosis not present

## 2020-07-27 DIAGNOSIS — I509 Heart failure, unspecified: Secondary | ICD-10-CM | POA: Diagnosis not present

## 2020-07-29 DIAGNOSIS — I255 Ischemic cardiomyopathy: Secondary | ICD-10-CM | POA: Diagnosis not present

## 2020-07-29 DIAGNOSIS — S22079D Unspecified fracture of T9-T10 vertebra, subsequent encounter for fracture with routine healing: Secondary | ICD-10-CM | POA: Diagnosis not present

## 2020-07-29 DIAGNOSIS — E1165 Type 2 diabetes mellitus with hyperglycemia: Secondary | ICD-10-CM | POA: Diagnosis not present

## 2020-07-29 DIAGNOSIS — I110 Hypertensive heart disease with heart failure: Secondary | ICD-10-CM | POA: Diagnosis not present

## 2020-07-29 DIAGNOSIS — I509 Heart failure, unspecified: Secondary | ICD-10-CM | POA: Diagnosis not present

## 2020-07-29 DIAGNOSIS — I4891 Unspecified atrial fibrillation: Secondary | ICD-10-CM | POA: Diagnosis not present

## 2020-08-03 DIAGNOSIS — S22079D Unspecified fracture of T9-T10 vertebra, subsequent encounter for fracture with routine healing: Secondary | ICD-10-CM | POA: Diagnosis not present

## 2020-08-03 DIAGNOSIS — I110 Hypertensive heart disease with heart failure: Secondary | ICD-10-CM | POA: Diagnosis not present

## 2020-08-03 DIAGNOSIS — E1165 Type 2 diabetes mellitus with hyperglycemia: Secondary | ICD-10-CM | POA: Diagnosis not present

## 2020-08-03 DIAGNOSIS — I509 Heart failure, unspecified: Secondary | ICD-10-CM | POA: Diagnosis not present

## 2020-08-03 DIAGNOSIS — I4891 Unspecified atrial fibrillation: Secondary | ICD-10-CM | POA: Diagnosis not present

## 2020-08-03 DIAGNOSIS — I255 Ischemic cardiomyopathy: Secondary | ICD-10-CM | POA: Diagnosis not present

## 2020-08-05 DIAGNOSIS — I255 Ischemic cardiomyopathy: Secondary | ICD-10-CM | POA: Diagnosis not present

## 2020-08-05 DIAGNOSIS — I110 Hypertensive heart disease with heart failure: Secondary | ICD-10-CM | POA: Diagnosis not present

## 2020-08-05 DIAGNOSIS — I509 Heart failure, unspecified: Secondary | ICD-10-CM | POA: Diagnosis not present

## 2020-08-05 DIAGNOSIS — S22079D Unspecified fracture of T9-T10 vertebra, subsequent encounter for fracture with routine healing: Secondary | ICD-10-CM | POA: Diagnosis not present

## 2020-08-05 DIAGNOSIS — E1165 Type 2 diabetes mellitus with hyperglycemia: Secondary | ICD-10-CM | POA: Diagnosis not present

## 2020-08-05 DIAGNOSIS — I4891 Unspecified atrial fibrillation: Secondary | ICD-10-CM | POA: Diagnosis not present

## 2020-08-10 DIAGNOSIS — I110 Hypertensive heart disease with heart failure: Secondary | ICD-10-CM | POA: Diagnosis not present

## 2020-08-10 DIAGNOSIS — S22079D Unspecified fracture of T9-T10 vertebra, subsequent encounter for fracture with routine healing: Secondary | ICD-10-CM | POA: Diagnosis not present

## 2020-08-10 DIAGNOSIS — I509 Heart failure, unspecified: Secondary | ICD-10-CM | POA: Diagnosis not present

## 2020-08-10 DIAGNOSIS — I4891 Unspecified atrial fibrillation: Secondary | ICD-10-CM | POA: Diagnosis not present

## 2020-08-10 DIAGNOSIS — I255 Ischemic cardiomyopathy: Secondary | ICD-10-CM | POA: Diagnosis not present

## 2020-08-10 DIAGNOSIS — E1165 Type 2 diabetes mellitus with hyperglycemia: Secondary | ICD-10-CM | POA: Diagnosis not present

## 2020-08-12 DIAGNOSIS — S22079D Unspecified fracture of T9-T10 vertebra, subsequent encounter for fracture with routine healing: Secondary | ICD-10-CM | POA: Diagnosis not present

## 2020-08-12 DIAGNOSIS — I4891 Unspecified atrial fibrillation: Secondary | ICD-10-CM | POA: Diagnosis not present

## 2020-08-12 DIAGNOSIS — I509 Heart failure, unspecified: Secondary | ICD-10-CM | POA: Diagnosis not present

## 2020-08-12 DIAGNOSIS — I110 Hypertensive heart disease with heart failure: Secondary | ICD-10-CM | POA: Diagnosis not present

## 2020-08-12 DIAGNOSIS — E1165 Type 2 diabetes mellitus with hyperglycemia: Secondary | ICD-10-CM | POA: Diagnosis not present

## 2020-08-12 DIAGNOSIS — I255 Ischemic cardiomyopathy: Secondary | ICD-10-CM | POA: Diagnosis not present

## 2020-08-14 DIAGNOSIS — I255 Ischemic cardiomyopathy: Secondary | ICD-10-CM | POA: Diagnosis not present

## 2020-08-14 DIAGNOSIS — C44329 Squamous cell carcinoma of skin of other parts of face: Secondary | ICD-10-CM | POA: Diagnosis not present

## 2020-08-14 DIAGNOSIS — Z8673 Personal history of transient ischemic attack (TIA), and cerebral infarction without residual deficits: Secondary | ICD-10-CM | POA: Diagnosis not present

## 2020-08-14 DIAGNOSIS — Z981 Arthrodesis status: Secondary | ICD-10-CM | POA: Diagnosis not present

## 2020-08-14 DIAGNOSIS — L570 Actinic keratosis: Secondary | ICD-10-CM | POA: Diagnosis not present

## 2020-08-14 DIAGNOSIS — E785 Hyperlipidemia, unspecified: Secondary | ICD-10-CM | POA: Diagnosis not present

## 2020-08-14 DIAGNOSIS — F17290 Nicotine dependence, other tobacco product, uncomplicated: Secondary | ICD-10-CM | POA: Diagnosis not present

## 2020-08-14 DIAGNOSIS — K449 Diaphragmatic hernia without obstruction or gangrene: Secondary | ICD-10-CM | POA: Diagnosis not present

## 2020-08-14 DIAGNOSIS — I110 Hypertensive heart disease with heart failure: Secondary | ICD-10-CM | POA: Diagnosis not present

## 2020-08-14 DIAGNOSIS — Z794 Long term (current) use of insulin: Secondary | ICD-10-CM | POA: Diagnosis not present

## 2020-08-14 DIAGNOSIS — H9190 Unspecified hearing loss, unspecified ear: Secondary | ICD-10-CM | POA: Diagnosis not present

## 2020-08-14 DIAGNOSIS — Z7901 Long term (current) use of anticoagulants: Secondary | ICD-10-CM | POA: Diagnosis not present

## 2020-08-14 DIAGNOSIS — I509 Heart failure, unspecified: Secondary | ICD-10-CM | POA: Diagnosis not present

## 2020-08-14 DIAGNOSIS — K259 Gastric ulcer, unspecified as acute or chronic, without hemorrhage or perforation: Secondary | ICD-10-CM | POA: Diagnosis not present

## 2020-08-14 DIAGNOSIS — I2510 Atherosclerotic heart disease of native coronary artery without angina pectoris: Secondary | ICD-10-CM | POA: Diagnosis not present

## 2020-08-14 DIAGNOSIS — Z9181 History of falling: Secondary | ICD-10-CM | POA: Diagnosis not present

## 2020-08-14 DIAGNOSIS — K222 Esophageal obstruction: Secondary | ICD-10-CM | POA: Diagnosis not present

## 2020-08-14 DIAGNOSIS — Z87442 Personal history of urinary calculi: Secondary | ICD-10-CM | POA: Diagnosis not present

## 2020-08-14 DIAGNOSIS — S22079D Unspecified fracture of T9-T10 vertebra, subsequent encounter for fracture with routine healing: Secondary | ICD-10-CM | POA: Diagnosis not present

## 2020-08-14 DIAGNOSIS — M1990 Unspecified osteoarthritis, unspecified site: Secondary | ICD-10-CM | POA: Diagnosis not present

## 2020-08-14 DIAGNOSIS — E1165 Type 2 diabetes mellitus with hyperglycemia: Secondary | ICD-10-CM | POA: Diagnosis not present

## 2020-08-14 DIAGNOSIS — I951 Orthostatic hypotension: Secondary | ICD-10-CM | POA: Diagnosis not present

## 2020-08-14 DIAGNOSIS — K2270 Barrett's esophagus without dysplasia: Secondary | ICD-10-CM | POA: Diagnosis not present

## 2020-08-14 DIAGNOSIS — I4891 Unspecified atrial fibrillation: Secondary | ICD-10-CM | POA: Diagnosis not present

## 2020-08-14 DIAGNOSIS — Z7984 Long term (current) use of oral hypoglycemic drugs: Secondary | ICD-10-CM | POA: Diagnosis not present

## 2020-08-17 DIAGNOSIS — I110 Hypertensive heart disease with heart failure: Secondary | ICD-10-CM | POA: Diagnosis not present

## 2020-08-17 DIAGNOSIS — I255 Ischemic cardiomyopathy: Secondary | ICD-10-CM | POA: Diagnosis not present

## 2020-08-17 DIAGNOSIS — S22079D Unspecified fracture of T9-T10 vertebra, subsequent encounter for fracture with routine healing: Secondary | ICD-10-CM | POA: Diagnosis not present

## 2020-08-17 DIAGNOSIS — I509 Heart failure, unspecified: Secondary | ICD-10-CM | POA: Diagnosis not present

## 2020-08-17 DIAGNOSIS — E1165 Type 2 diabetes mellitus with hyperglycemia: Secondary | ICD-10-CM | POA: Diagnosis not present

## 2020-08-17 DIAGNOSIS — I4891 Unspecified atrial fibrillation: Secondary | ICD-10-CM | POA: Diagnosis not present

## 2020-08-19 DIAGNOSIS — I509 Heart failure, unspecified: Secondary | ICD-10-CM | POA: Diagnosis not present

## 2020-08-19 DIAGNOSIS — S22079D Unspecified fracture of T9-T10 vertebra, subsequent encounter for fracture with routine healing: Secondary | ICD-10-CM | POA: Diagnosis not present

## 2020-08-19 DIAGNOSIS — E1165 Type 2 diabetes mellitus with hyperglycemia: Secondary | ICD-10-CM | POA: Diagnosis not present

## 2020-08-19 DIAGNOSIS — I4891 Unspecified atrial fibrillation: Secondary | ICD-10-CM | POA: Diagnosis not present

## 2020-08-19 DIAGNOSIS — I255 Ischemic cardiomyopathy: Secondary | ICD-10-CM | POA: Diagnosis not present

## 2020-08-19 DIAGNOSIS — I110 Hypertensive heart disease with heart failure: Secondary | ICD-10-CM | POA: Diagnosis not present

## 2020-08-23 ENCOUNTER — Other Ambulatory Visit: Payer: Self-pay

## 2020-08-23 ENCOUNTER — Ambulatory Visit (INDEPENDENT_AMBULATORY_CARE_PROVIDER_SITE_OTHER): Payer: Medicare Other

## 2020-08-23 DIAGNOSIS — K2270 Barrett's esophagus without dysplasia: Secondary | ICD-10-CM

## 2020-08-23 DIAGNOSIS — E785 Hyperlipidemia, unspecified: Secondary | ICD-10-CM

## 2020-08-23 DIAGNOSIS — Z950 Presence of cardiac pacemaker: Secondary | ICD-10-CM

## 2020-08-23 DIAGNOSIS — I951 Orthostatic hypotension: Secondary | ICD-10-CM | POA: Diagnosis not present

## 2020-08-23 DIAGNOSIS — K449 Diaphragmatic hernia without obstruction or gangrene: Secondary | ICD-10-CM

## 2020-08-23 DIAGNOSIS — I2510 Atherosclerotic heart disease of native coronary artery without angina pectoris: Secondary | ICD-10-CM | POA: Diagnosis not present

## 2020-08-23 DIAGNOSIS — I509 Heart failure, unspecified: Secondary | ICD-10-CM | POA: Diagnosis not present

## 2020-08-23 DIAGNOSIS — I110 Hypertensive heart disease with heart failure: Secondary | ICD-10-CM

## 2020-08-23 DIAGNOSIS — I4891 Unspecified atrial fibrillation: Secondary | ICD-10-CM | POA: Diagnosis not present

## 2020-08-23 DIAGNOSIS — C44329 Squamous cell carcinoma of skin of other parts of face: Secondary | ICD-10-CM | POA: Diagnosis not present

## 2020-08-23 DIAGNOSIS — Z9181 History of falling: Secondary | ICD-10-CM

## 2020-08-23 DIAGNOSIS — H9190 Unspecified hearing loss, unspecified ear: Secondary | ICD-10-CM

## 2020-08-23 DIAGNOSIS — E1165 Type 2 diabetes mellitus with hyperglycemia: Secondary | ICD-10-CM

## 2020-08-23 DIAGNOSIS — I255 Ischemic cardiomyopathy: Secondary | ICD-10-CM

## 2020-08-23 DIAGNOSIS — K259 Gastric ulcer, unspecified as acute or chronic, without hemorrhage or perforation: Secondary | ICD-10-CM

## 2020-08-23 DIAGNOSIS — Z794 Long term (current) use of insulin: Secondary | ICD-10-CM

## 2020-08-23 DIAGNOSIS — L570 Actinic keratosis: Secondary | ICD-10-CM

## 2020-08-23 DIAGNOSIS — F17290 Nicotine dependence, other tobacco product, uncomplicated: Secondary | ICD-10-CM | POA: Diagnosis not present

## 2020-08-23 DIAGNOSIS — Z8673 Personal history of transient ischemic attack (TIA), and cerebral infarction without residual deficits: Secondary | ICD-10-CM

## 2020-08-23 DIAGNOSIS — Z7901 Long term (current) use of anticoagulants: Secondary | ICD-10-CM

## 2020-08-23 DIAGNOSIS — S22079D Unspecified fracture of T9-T10 vertebra, subsequent encounter for fracture with routine healing: Secondary | ICD-10-CM

## 2020-08-23 DIAGNOSIS — K222 Esophageal obstruction: Secondary | ICD-10-CM

## 2020-08-23 DIAGNOSIS — Z87442 Personal history of urinary calculi: Secondary | ICD-10-CM

## 2020-08-23 DIAGNOSIS — Z981 Arthrodesis status: Secondary | ICD-10-CM

## 2020-08-23 DIAGNOSIS — M1990 Unspecified osteoarthritis, unspecified site: Secondary | ICD-10-CM

## 2020-08-23 DIAGNOSIS — Z7984 Long term (current) use of oral hypoglycemic drugs: Secondary | ICD-10-CM

## 2020-08-24 DIAGNOSIS — I110 Hypertensive heart disease with heart failure: Secondary | ICD-10-CM | POA: Diagnosis not present

## 2020-08-24 DIAGNOSIS — E1165 Type 2 diabetes mellitus with hyperglycemia: Secondary | ICD-10-CM | POA: Diagnosis not present

## 2020-08-24 DIAGNOSIS — S22079D Unspecified fracture of T9-T10 vertebra, subsequent encounter for fracture with routine healing: Secondary | ICD-10-CM | POA: Diagnosis not present

## 2020-08-24 DIAGNOSIS — I509 Heart failure, unspecified: Secondary | ICD-10-CM | POA: Diagnosis not present

## 2020-08-24 DIAGNOSIS — I4891 Unspecified atrial fibrillation: Secondary | ICD-10-CM | POA: Diagnosis not present

## 2020-08-24 DIAGNOSIS — I255 Ischemic cardiomyopathy: Secondary | ICD-10-CM | POA: Diagnosis not present

## 2020-08-26 ENCOUNTER — Encounter: Payer: Self-pay | Admitting: Orthopaedic Surgery

## 2020-08-26 ENCOUNTER — Ambulatory Visit: Payer: Self-pay

## 2020-08-26 ENCOUNTER — Other Ambulatory Visit: Payer: Self-pay

## 2020-08-26 ENCOUNTER — Ambulatory Visit (INDEPENDENT_AMBULATORY_CARE_PROVIDER_SITE_OTHER): Payer: Medicare Other | Admitting: Orthopaedic Surgery

## 2020-08-26 VITALS — Ht 71.0 in | Wt 225.0 lb

## 2020-08-26 DIAGNOSIS — I4891 Unspecified atrial fibrillation: Secondary | ICD-10-CM | POA: Diagnosis not present

## 2020-08-26 DIAGNOSIS — G8929 Other chronic pain: Secondary | ICD-10-CM

## 2020-08-26 DIAGNOSIS — M1711 Unilateral primary osteoarthritis, right knee: Secondary | ICD-10-CM | POA: Diagnosis not present

## 2020-08-26 DIAGNOSIS — M25561 Pain in right knee: Secondary | ICD-10-CM

## 2020-08-26 DIAGNOSIS — I25118 Atherosclerotic heart disease of native coronary artery with other forms of angina pectoris: Secondary | ICD-10-CM

## 2020-08-26 DIAGNOSIS — I509 Heart failure, unspecified: Secondary | ICD-10-CM | POA: Diagnosis not present

## 2020-08-26 DIAGNOSIS — E1165 Type 2 diabetes mellitus with hyperglycemia: Secondary | ICD-10-CM | POA: Diagnosis not present

## 2020-08-26 DIAGNOSIS — S22079D Unspecified fracture of T9-T10 vertebra, subsequent encounter for fracture with routine healing: Secondary | ICD-10-CM | POA: Diagnosis not present

## 2020-08-26 DIAGNOSIS — I255 Ischemic cardiomyopathy: Secondary | ICD-10-CM | POA: Diagnosis not present

## 2020-08-26 DIAGNOSIS — I110 Hypertensive heart disease with heart failure: Secondary | ICD-10-CM | POA: Diagnosis not present

## 2020-08-26 MED ORDER — METHYLPREDNISOLONE ACETATE 40 MG/ML IJ SUSP
40.0000 mg | INTRAMUSCULAR | Status: AC | PRN
  Administered 2020-08-26: 40 mg via INTRA_ARTICULAR

## 2020-08-26 MED ORDER — BUPIVACAINE HCL 0.25 % IJ SOLN
4.0000 mL | INTRAMUSCULAR | Status: AC | PRN
  Administered 2020-08-26: 4 mL via INTRA_ARTICULAR

## 2020-08-26 MED ORDER — LIDOCAINE HCL 1 % IJ SOLN
0.5000 mL | INTRAMUSCULAR | Status: AC | PRN
  Administered 2020-08-26: .5 mL

## 2020-08-26 NOTE — Progress Notes (Signed)
Office Visit Note   Patient: Edward Heath           Date of Birth: 08/21/1945           MRN: [id number] Visit Date: 08/26/2020              Requested by: Claretta Fraise, MD [place],  [place] [address] PCP: Claretta Fraise, MD   Assessment & Plan: Visit Diagnoses:  1. Chronic pain of right knee   2. Unilateral primary osteoarthritis, right knee     Plan: Knee injection performed which she tolerated well.  He will return if he has persistent problems.  Hopefully get relief.  Follow-Up Instructions: No follow-ups on file.   Orders:  Orders Placed This Encounter  Procedures  . Large Joint Inj: R knee  . XR KNEE 3 VIEW RIGHT   No orders of the defined types were placed in this encounter.     Procedures: Large Joint Inj: R knee on 08/26/2020 10:54 AM Indications: pain and joint swelling Details: 22 G 1.5 in needle, anterolateral approach  Arthrogram: No  Medications: 40 mg methylPREDNISolone acetate 40 MG/ML; 0.5 mL lidocaine 1 %; 4 mL bupivacaine 0.25 % Outcome: tolerated well, no immediate complications Procedure, treatment alternatives, risks and benefits explained, specific risks discussed. Consent was given by the patient. Immediately prior to procedure a time out was called to verify the correct patient, procedure, equipment, support staff and site/side marked as required. Patient was prepped and draped in the usual sterile fashion.       Clinical Data: No additional findings.   Subjective: Chief Complaint  Patient presents with  . Right Knee - Pain    HPI 75 year old male with problems with knee pain.  He fell last year had to have surgery for thoracic unstable fracture with multilevel is fused and instrumented.  He has had a couple falls this year 1 playing golf.  He wears a brace on his left knee he has pain in his right knee with catching.  He has had problems with coronary artery disease previous CVA, heart failure, cardiomyopathy, ICD.   Previous cubital tunnel surgery, type 2 diabetes among other problems.  Review of Systems all other systems noncontributory to HPI other than mentioned above.   Objective: Vital Signs: Ht 5\' 11"  (1.803 m)   Wt 225 lb (102.1 kg)   BMI 31.38 kg/m   Physical Exam Constitutional:      Appearance: He is well-developed.  HENT:     Head: Normocephalic and atraumatic.  Eyes:     Pupils: Pupils are equal, round, and reactive to light.  Neck:     Thyroid: No thyromegaly.     Trachea: No tracheal deviation.  Cardiovascular:     Rate and Rhythm: Normal rate.  Pulmonary:     Effort: Pulmonary effort is normal.     Breath sounds: No wheezing.  Abdominal:     General: Bowel sounds are normal.     Palpations: Abdomen is soft.  Skin:    General: Skin is warm and dry.     Capillary Refill: Capillary refill takes less than 2 seconds.  Neurological:     Mental Status: He is alert and oriented to person, place, and time.  Psychiatric:        Behavior: Behavior normal.        Thought Content: Thought content normal.        Judgment: Judgment normal.     Ortho Exam 2+  knee effusion knee crepitus he lacks 15 degrees reaching full extension right knee.  Negative logroll hips.  Uses a compressive sleeve on his opposite left knee. Specialty Comments:  No specialty comments available.  Imaging: No results found.   PMFS History: Patient Active Problem List   Diagnosis Date Noted  . Unilateral primary osteoarthritis, right knee 08/26/2020  . S/P cubital tunnel release 09/05/2019  . Cubital tunnel syndrome on left 07/03/2019  . Absolute anemia 03/05/2018  . S/P ICD (internal cardiac defibrillator) procedure 01/17/2018  . Type 2 diabetes mellitus ([place]) 12/14/2015  . Cardiomyopathy ([place]) 12/14/2015  . Hyperlipidemia 12/14/2015  . 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 Congestive heart failure ([place]) 10/09/2013  . Action tremor 06/04/2013  . Personal history of other  malignant neoplasm of skin 11/14/2012  . Ankylosis of spine 04/20/2011  . Fracture of T6 vertebra ([place]) 04/20/2011  . Hiatal hernia 09/02/2010  . Barrett's esophagus 09/02/2010  . Carotid stenosis, bilateral 09/02/2010  . Hypertension 08/31/2010  . CAD (coronary artery disease) 08/31/2010  . PAD (peripheral artery disease) ([place]) 08/31/2010  . DDD (degenerative disc disease), lumbar 08/31/2010  . Chronic low back pain 08/31/2010  . Actinic keratosis 08/31/2010  . Melanoma ([place]) 08/31/2010  . Anterior circulation transient ischemic attack 05/22/2009  . Stroke [facility]) 10/25/2005   Past Medical History:  Diagnosis Date  . AICD (automatic cardioverter/defibrillator) present   . Arthritis   . CHF (congestive heart failure) ([place])   . Coronary artery disease   . Diabetes mellitus without complication ([place])   . Dysrhythmia   . GERD (gastroesophageal reflux disease)   . Heart attack ([place]) 2004, 2015  . History of kidney stones   . Hyperlipidemia   . Hypertension   . Hypothyroidism   . Lumbar vertebral fracture ([place]) 2012  . NSTEMI, initial episode of care ([place]) 10/09/2013  . Skin cancer 06/2014   left cheek  . Stroke ([place])    x3  . Thyroid disease   . TIA (transient ischemic attack) 2009    Family History  Problem Relation Age of Onset  . Heart attack Mother   . Aneurysm Father   . Heart attack Brother 35    Past Surgical History:  Procedure Laterality Date  . CARDIAC DEFIBRILLATOR PLACEMENT  12/2003, Repaired in 2012   x3  . KNEE SURGERY Right 1994  . SKIN CANCER EXCISION  06/2014   had 4 removed  . 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   Procedure: LEFT ELBOW ULNAR NERVE DECOMPRESSION;  Surgeon: [name], MD;  Location: [facility];  Service: Orthopedics;  Laterality: Left;   Social History   Occupational History  . Not on file  Tobacco Use  . Smoking status: Never Smoker  . Smokeless tobacco: Current User    Types: Chew  Vaping Use  . Vaping Use: Never used   Substance and Sexual Activity  . Alcohol use: Yes    Comment: occ  . Drug use: No  . Sexual activity: Yes

## 2020-08-31 DIAGNOSIS — E1165 Type 2 diabetes mellitus with hyperglycemia: Secondary | ICD-10-CM | POA: Diagnosis not present

## 2020-08-31 DIAGNOSIS — S22079D Unspecified fracture of T9-T10 vertebra, subsequent encounter for fracture with routine healing: Secondary | ICD-10-CM | POA: Diagnosis not present

## 2020-08-31 DIAGNOSIS — I4891 Unspecified atrial fibrillation: Secondary | ICD-10-CM | POA: Diagnosis not present

## 2020-08-31 DIAGNOSIS — I255 Ischemic cardiomyopathy: Secondary | ICD-10-CM | POA: Diagnosis not present

## 2020-08-31 DIAGNOSIS — I509 Heart failure, unspecified: Secondary | ICD-10-CM | POA: Diagnosis not present

## 2020-08-31 DIAGNOSIS — I110 Hypertensive heart disease with heart failure: Secondary | ICD-10-CM | POA: Diagnosis not present

## 2020-09-01 ENCOUNTER — Ambulatory Visit: Payer: Medicare Other | Admitting: Family Medicine

## 2020-09-02 DIAGNOSIS — I255 Ischemic cardiomyopathy: Secondary | ICD-10-CM | POA: Diagnosis not present

## 2020-09-02 DIAGNOSIS — E1165 Type 2 diabetes mellitus with hyperglycemia: Secondary | ICD-10-CM | POA: Diagnosis not present

## 2020-09-02 DIAGNOSIS — I509 Heart failure, unspecified: Secondary | ICD-10-CM | POA: Diagnosis not present

## 2020-09-02 DIAGNOSIS — I110 Hypertensive heart disease with heart failure: Secondary | ICD-10-CM | POA: Diagnosis not present

## 2020-09-02 DIAGNOSIS — S22079D Unspecified fracture of T9-T10 vertebra, subsequent encounter for fracture with routine healing: Secondary | ICD-10-CM | POA: Diagnosis not present

## 2020-09-02 DIAGNOSIS — I4891 Unspecified atrial fibrillation: Secondary | ICD-10-CM | POA: Diagnosis not present

## 2020-09-07 DIAGNOSIS — I110 Hypertensive heart disease with heart failure: Secondary | ICD-10-CM | POA: Diagnosis not present

## 2020-09-07 DIAGNOSIS — I255 Ischemic cardiomyopathy: Secondary | ICD-10-CM | POA: Diagnosis not present

## 2020-09-07 DIAGNOSIS — I4891 Unspecified atrial fibrillation: Secondary | ICD-10-CM | POA: Diagnosis not present

## 2020-09-07 DIAGNOSIS — E1165 Type 2 diabetes mellitus with hyperglycemia: Secondary | ICD-10-CM | POA: Diagnosis not present

## 2020-09-07 DIAGNOSIS — I509 Heart failure, unspecified: Secondary | ICD-10-CM | POA: Diagnosis not present

## 2020-09-07 DIAGNOSIS — S22079D Unspecified fracture of T9-T10 vertebra, subsequent encounter for fracture with routine healing: Secondary | ICD-10-CM | POA: Diagnosis not present

## 2020-09-09 DIAGNOSIS — I255 Ischemic cardiomyopathy: Secondary | ICD-10-CM | POA: Diagnosis not present

## 2020-09-09 DIAGNOSIS — I509 Heart failure, unspecified: Secondary | ICD-10-CM | POA: Diagnosis not present

## 2020-09-09 DIAGNOSIS — I4891 Unspecified atrial fibrillation: Secondary | ICD-10-CM | POA: Diagnosis not present

## 2020-09-09 DIAGNOSIS — E1165 Type 2 diabetes mellitus with hyperglycemia: Secondary | ICD-10-CM | POA: Diagnosis not present

## 2020-09-09 DIAGNOSIS — I110 Hypertensive heart disease with heart failure: Secondary | ICD-10-CM | POA: Diagnosis not present

## 2020-09-09 DIAGNOSIS — S22079D Unspecified fracture of T9-T10 vertebra, subsequent encounter for fracture with routine healing: Secondary | ICD-10-CM | POA: Diagnosis not present

## 2020-09-13 DIAGNOSIS — I2510 Atherosclerotic heart disease of native coronary artery without angina pectoris: Secondary | ICD-10-CM | POA: Diagnosis not present

## 2020-09-13 DIAGNOSIS — K2270 Barrett's esophagus without dysplasia: Secondary | ICD-10-CM | POA: Diagnosis not present

## 2020-09-13 DIAGNOSIS — Z794 Long term (current) use of insulin: Secondary | ICD-10-CM | POA: Diagnosis not present

## 2020-09-13 DIAGNOSIS — E785 Hyperlipidemia, unspecified: Secondary | ICD-10-CM | POA: Diagnosis not present

## 2020-09-13 DIAGNOSIS — Z8673 Personal history of transient ischemic attack (TIA), and cerebral infarction without residual deficits: Secondary | ICD-10-CM | POA: Diagnosis not present

## 2020-09-13 DIAGNOSIS — M1990 Unspecified osteoarthritis, unspecified site: Secondary | ICD-10-CM | POA: Diagnosis not present

## 2020-09-13 DIAGNOSIS — I951 Orthostatic hypotension: Secondary | ICD-10-CM | POA: Diagnosis not present

## 2020-09-13 DIAGNOSIS — Z7984 Long term (current) use of oral hypoglycemic drugs: Secondary | ICD-10-CM | POA: Diagnosis not present

## 2020-09-13 DIAGNOSIS — Z87442 Personal history of urinary calculi: Secondary | ICD-10-CM | POA: Diagnosis not present

## 2020-09-13 DIAGNOSIS — Z981 Arthrodesis status: Secondary | ICD-10-CM | POA: Diagnosis not present

## 2020-09-13 DIAGNOSIS — I110 Hypertensive heart disease with heart failure: Secondary | ICD-10-CM | POA: Diagnosis not present

## 2020-09-13 DIAGNOSIS — L570 Actinic keratosis: Secondary | ICD-10-CM | POA: Diagnosis not present

## 2020-09-13 DIAGNOSIS — E1165 Type 2 diabetes mellitus with hyperglycemia: Secondary | ICD-10-CM | POA: Diagnosis not present

## 2020-09-13 DIAGNOSIS — H9190 Unspecified hearing loss, unspecified ear: Secondary | ICD-10-CM | POA: Diagnosis not present

## 2020-09-13 DIAGNOSIS — Z7901 Long term (current) use of anticoagulants: Secondary | ICD-10-CM | POA: Diagnosis not present

## 2020-09-13 DIAGNOSIS — K259 Gastric ulcer, unspecified as acute or chronic, without hemorrhage or perforation: Secondary | ICD-10-CM | POA: Diagnosis not present

## 2020-09-13 DIAGNOSIS — I255 Ischemic cardiomyopathy: Secondary | ICD-10-CM | POA: Diagnosis not present

## 2020-09-13 DIAGNOSIS — K222 Esophageal obstruction: Secondary | ICD-10-CM | POA: Diagnosis not present

## 2020-09-13 DIAGNOSIS — K449 Diaphragmatic hernia without obstruction or gangrene: Secondary | ICD-10-CM | POA: Diagnosis not present

## 2020-09-13 DIAGNOSIS — I509 Heart failure, unspecified: Secondary | ICD-10-CM | POA: Diagnosis not present

## 2020-09-13 DIAGNOSIS — S22079D Unspecified fracture of T9-T10 vertebra, subsequent encounter for fracture with routine healing: Secondary | ICD-10-CM | POA: Diagnosis not present

## 2020-09-13 DIAGNOSIS — C44329 Squamous cell carcinoma of skin of other parts of face: Secondary | ICD-10-CM | POA: Diagnosis not present

## 2020-09-13 DIAGNOSIS — Z9181 History of falling: Secondary | ICD-10-CM | POA: Diagnosis not present

## 2020-09-13 DIAGNOSIS — F17290 Nicotine dependence, other tobacco product, uncomplicated: Secondary | ICD-10-CM | POA: Diagnosis not present

## 2020-09-13 DIAGNOSIS — I4891 Unspecified atrial fibrillation: Secondary | ICD-10-CM | POA: Diagnosis not present

## 2020-09-14 DIAGNOSIS — I255 Ischemic cardiomyopathy: Secondary | ICD-10-CM | POA: Diagnosis not present

## 2020-09-14 DIAGNOSIS — S22079D Unspecified fracture of T9-T10 vertebra, subsequent encounter for fracture with routine healing: Secondary | ICD-10-CM | POA: Diagnosis not present

## 2020-09-14 DIAGNOSIS — I110 Hypertensive heart disease with heart failure: Secondary | ICD-10-CM | POA: Diagnosis not present

## 2020-09-14 DIAGNOSIS — I4891 Unspecified atrial fibrillation: Secondary | ICD-10-CM | POA: Diagnosis not present

## 2020-09-14 DIAGNOSIS — I509 Heart failure, unspecified: Secondary | ICD-10-CM | POA: Diagnosis not present

## 2020-09-14 DIAGNOSIS — E1165 Type 2 diabetes mellitus with hyperglycemia: Secondary | ICD-10-CM | POA: Diagnosis not present

## 2020-09-15 DIAGNOSIS — L570 Actinic keratosis: Secondary | ICD-10-CM | POA: Diagnosis not present

## 2020-09-15 DIAGNOSIS — Z85828 Personal history of other malignant neoplasm of skin: Secondary | ICD-10-CM | POA: Diagnosis not present

## 2020-09-16 DIAGNOSIS — E1165 Type 2 diabetes mellitus with hyperglycemia: Secondary | ICD-10-CM | POA: Diagnosis not present

## 2020-09-16 DIAGNOSIS — I255 Ischemic cardiomyopathy: Secondary | ICD-10-CM | POA: Diagnosis not present

## 2020-09-16 DIAGNOSIS — I110 Hypertensive heart disease with heart failure: Secondary | ICD-10-CM | POA: Diagnosis not present

## 2020-09-16 DIAGNOSIS — I509 Heart failure, unspecified: Secondary | ICD-10-CM | POA: Diagnosis not present

## 2020-09-16 DIAGNOSIS — I4891 Unspecified atrial fibrillation: Secondary | ICD-10-CM | POA: Diagnosis not present

## 2020-09-16 DIAGNOSIS — S22079D Unspecified fracture of T9-T10 vertebra, subsequent encounter for fracture with routine healing: Secondary | ICD-10-CM | POA: Diagnosis not present

## 2020-09-22 DIAGNOSIS — E1165 Type 2 diabetes mellitus with hyperglycemia: Secondary | ICD-10-CM | POA: Diagnosis not present

## 2020-09-22 DIAGNOSIS — I4891 Unspecified atrial fibrillation: Secondary | ICD-10-CM | POA: Diagnosis not present

## 2020-09-22 DIAGNOSIS — I110 Hypertensive heart disease with heart failure: Secondary | ICD-10-CM | POA: Diagnosis not present

## 2020-09-22 DIAGNOSIS — I255 Ischemic cardiomyopathy: Secondary | ICD-10-CM | POA: Diagnosis not present

## 2020-09-22 DIAGNOSIS — S22079D Unspecified fracture of T9-T10 vertebra, subsequent encounter for fracture with routine healing: Secondary | ICD-10-CM | POA: Diagnosis not present

## 2020-09-22 DIAGNOSIS — I509 Heart failure, unspecified: Secondary | ICD-10-CM | POA: Diagnosis not present

## 2020-09-29 DIAGNOSIS — I110 Hypertensive heart disease with heart failure: Secondary | ICD-10-CM | POA: Diagnosis not present

## 2020-09-29 DIAGNOSIS — E1165 Type 2 diabetes mellitus with hyperglycemia: Secondary | ICD-10-CM | POA: Diagnosis not present

## 2020-09-29 DIAGNOSIS — I509 Heart failure, unspecified: Secondary | ICD-10-CM | POA: Diagnosis not present

## 2020-09-29 DIAGNOSIS — S22079D Unspecified fracture of T9-T10 vertebra, subsequent encounter for fracture with routine healing: Secondary | ICD-10-CM | POA: Diagnosis not present

## 2020-09-29 DIAGNOSIS — I255 Ischemic cardiomyopathy: Secondary | ICD-10-CM | POA: Diagnosis not present

## 2020-09-29 DIAGNOSIS — I4891 Unspecified atrial fibrillation: Secondary | ICD-10-CM | POA: Diagnosis not present

## 2020-10-05 DIAGNOSIS — I255 Ischemic cardiomyopathy: Secondary | ICD-10-CM | POA: Diagnosis not present

## 2020-10-05 DIAGNOSIS — I4891 Unspecified atrial fibrillation: Secondary | ICD-10-CM | POA: Diagnosis not present

## 2020-10-05 DIAGNOSIS — I509 Heart failure, unspecified: Secondary | ICD-10-CM | POA: Diagnosis not present

## 2020-10-05 DIAGNOSIS — I110 Hypertensive heart disease with heart failure: Secondary | ICD-10-CM | POA: Diagnosis not present

## 2020-10-05 DIAGNOSIS — E1165 Type 2 diabetes mellitus with hyperglycemia: Secondary | ICD-10-CM | POA: Diagnosis not present

## 2020-10-05 DIAGNOSIS — S22079D Unspecified fracture of T9-T10 vertebra, subsequent encounter for fracture with routine healing: Secondary | ICD-10-CM | POA: Diagnosis not present

## 2020-10-08 ENCOUNTER — Ambulatory Visit (INDEPENDENT_AMBULATORY_CARE_PROVIDER_SITE_OTHER): Payer: Medicare Other

## 2020-10-08 VITALS — Ht 71.0 in | Wt 227.0 lb

## 2020-10-08 DIAGNOSIS — Z0000 Encounter for general adult medical examination without abnormal findings: Secondary | ICD-10-CM | POA: Diagnosis not present

## 2020-10-08 NOTE — Patient Instructions (Addendum)
Edward Heath , Thank you for taking time to come for your Medicare Wellness Visit. I appreciate your ongoing commitment to your health goals. Please review the following plan we discussed and let me know if I can assist you in the future.   Screening recommendations/referrals: Colonoscopy: Done at [facility] - no longer required Recommended yearly ophthalmology/optometry visit for glaucoma screening and checkup Recommended yearly dental visit for hygiene and checkup  Vaccinations: Influenza vaccine: Done 2021 - Due every fall Pneumococcal vaccine: Done 06/05/2013 & 03/22/2014 Tdap vaccine: Done at [facility] Shingles vaccine: Done at [place]: Done 06/23/2019, 07/21/2019, & 07/14/2020  Advanced directives: Please bring a copy of your health care power of attorney and living will to the office to be added to your chart at your convenience.  Conditions/risks identified: Aim for 30 minutes of exercise each day, drink 6-8 glasses of water and eat lots of fruits and vegetables  Next appointment: Follow up in one year for your annual wellness visit.   Preventive Care 76 Years and Older, Male  Preventive care refers to lifestyle choices and visits with your health care provider that can promote health and wellness. What does preventive care include?  A yearly physical exam. This is also called an annual well check.  Dental exams once or twice a year.  Routine eye exams. Ask your health care provider how often you should have your eyes checked.  Personal lifestyle choices, including:  Daily care of your teeth and gums.  Regular physical activity.  Eating a healthy diet.  Avoiding tobacco and drug use.  Limiting alcohol use.  Practicing safe sex.  Taking low doses of aspirin every day.  Taking vitamin and mineral supplements as recommended by your health care provider. What happens during an annual well check? The services and screenings done by your health care provider during your annual well  check will depend on your age, overall health, lifestyle risk factors, and family history of disease. Counseling  Your health care provider may ask you questions about your:  Alcohol use.  Tobacco use.  Drug use.  Emotional well-being.  Home and relationship well-being.  Sexual activity.  Eating habits.  History of falls.  Memory and ability to understand (cognition).  Work and work Statistician. Screening  You may have the following tests or measurements:  Height, weight, and BMI.  Blood pressure.  Lipid and cholesterol levels. These may be checked every 5 years, or more frequently if you are over 60 years old.  Skin check.  Lung cancer screening. You may have this screening every year starting at age 14 if you have a 30-pack-year history of smoking and currently smoke or have quit within the past 15 years.  Fecal occult blood test (FOBT) of the stool. You may have this test every year starting at age 71.  Flexible sigmoidoscopy or colonoscopy. You may have a sigmoidoscopy every 5 years or a colonoscopy every 10 years starting at age 30.  Prostate cancer screening. Recommendations will vary depending on your family history and other risks.  Hepatitis C blood test.  Hepatitis B blood test.  Sexually transmitted disease (STD) testing.  Diabetes screening. This is done by checking your blood sugar (glucose) after you have not eaten for a while (fasting). You may have this done every 1-3 years.  Abdominal aortic aneurysm (AAA) screening. You may need this if you are a current or former smoker.  Osteoporosis. You may be screened starting at age 28 if you are  at high risk. Talk with your health care provider about your test results, treatment options, and if necessary, the need for more tests. Vaccines  Your health care provider may recommend certain vaccines, such as:  Influenza vaccine. This is recommended every year.  Tetanus, diphtheria, and acellular  pertussis (Tdap, Td) vaccine. You may need a Td booster every 10 years.  Zoster vaccine. You may need this after age 26.  Pneumococcal 13-valent conjugate (PCV13) vaccine. One dose is recommended after age 91.  Pneumococcal polysaccharide (PPSV23) vaccine. One dose is recommended after age 91. Talk to your health care provider about which screenings and vaccines you need and how often you need them. This information is not intended to replace advice given to you by your health care provider. Make sure you discuss any questions you have with your health care provider. Document Released: 06/04/2015 Document Revised: 01/26/2016 Document Reviewed: 03/09/2015 Elsevier Interactive Patient Education  2017 [employer] Prevention in the Home Falls can cause injuries. They can happen to people of all ages. There are many things you can do to make your home safe and to help prevent falls. What can I do on the outside of my home?  Regularly fix the edges of walkways and driveways and fix any cracks.  Remove anything that might make you trip as you walk through a door, such as a raised step or threshold.  Trim any bushes or trees on the path to your home.  Use bright outdoor lighting.  Clear any walking paths of anything that might make someone trip, such as rocks or tools.  Regularly check to see if handrails are loose or broken. Make sure that both sides of any steps have handrails.  Any raised decks and porches should have guardrails on the edges.  Have any leaves, snow, or ice cleared regularly.  Use sand or salt on walking paths during winter.  Clean up any spills in your garage right away. This includes oil or grease spills. What can I do in the bathroom?  Use night lights.  Install grab bars by the toilet and in the tub and shower. Do not use towel bars as grab bars.  Use non-skid mats or decals in the tub or shower.  If you need to sit down in the shower, use a plastic,  non-slip stool.  Keep the floor dry. Clean up any water that spills on the floor as soon as it happens.  Remove soap buildup in the tub or shower regularly.  Attach bath mats securely with double-sided non-slip rug tape.  Do not have throw rugs and other things on the floor that can make you trip. What can I do in the bedroom?  Use night lights.  Make sure that you have a light by your bed that is easy to reach.  Do not use any sheets or blankets that are too big for your bed. They should not hang down onto the floor.  Have a firm chair that has side arms. You can use this for support while you get dressed.  Do not have throw rugs and other things on the floor that can make you trip. What can I do in the kitchen?  Clean up any spills right away.  Avoid walking on wet floors.  Keep items that you use a lot in easy-to-reach places.  If you need to reach something above you, use a strong step stool that has a grab bar.  Keep electrical cords out of  the way.  Do not use floor polish or wax that makes floors slippery. If you must use wax, use non-skid floor wax.  Do not have throw rugs and other things on the floor that can make you trip. What can I do with my stairs?  Do not leave any items on the stairs.  Make sure that there are handrails on both sides of the stairs and use them. Fix handrails that are broken or loose. Make sure that handrails are as long as the stairways.  Check any carpeting to make sure that it is firmly attached to the stairs. Fix any carpet that is loose or worn.  Avoid having throw rugs at the top or bottom of the stairs. If you do have throw rugs, attach them to the floor with carpet tape.  Make sure that you have a light switch at the top of the stairs and the bottom of the stairs. If you do not have them, ask someone to add them for you. What else can I do to help prevent falls?  Wear shoes that:  Do not have high heels.  Have rubber  bottoms.  Are comfortable and fit you well.  Are closed at the toe. Do not wear sandals.  If you use a stepladder:  Make sure that it is fully opened. Do not climb a closed stepladder.  Make sure that both sides of the stepladder are locked into place.  Ask someone to hold it for you, if possible.  Clearly mark and make sure that you can see:  Any grab bars or handrails.  First and last steps.  Where the edge of each step is.  Use tools that help you move around (mobility aids) if they are needed. These include:  Canes.  Walkers.  Scooters.  Crutches.  Turn on the lights when you go into a dark area. Replace any light bulbs as soon as they burn out.  Set up your furniture so you have a clear path. Avoid moving your furniture around.  If any of your floors are uneven, fix them.  If there are any pets around you, be aware of where they are.  Review your medicines with your doctor. Some medicines can make you feel dizzy. This can increase your chance of falling. Ask your doctor what other things that you can do to help prevent falls. This information is not intended to replace advice given to you by your health care provider. Make sure you discuss any questions you have with your health care provider. Document Released: 03/04/2009 Document Revised: 10/14/2015 Document Reviewed: 06/12/2014 Elsevier Interactive Patient Education  2017 [employer].

## 2020-10-08 NOTE — Progress Notes (Signed)
Subjective:   Edward Heath is a 75 y.o. male who presents for Medicare Annual/Subsequent preventive examination.  Virtual Visit via Telephone Note  I connected with  Edward Heath on 10/08/20 at 10:30 AM EDT by telephone and verified that I am speaking with the correct person using two identifiers.  Location: Patient: Home Provider: [facility] Persons participating in the virtual visit: patient/Nurse Health Advisor   I discussed the limitations, risks, security and privacy concerns of performing an evaluation and management service by telephone and the availability of in person appointments. The patient expressed understanding and agreed to proceed.  Interactive audio and video telecommunications were attempted between this nurse and patient, however failed, due to patient having technical difficulties OR patient did not have access to video capability.  We continued and completed visit with audio only.  Some vital signs may be absent or patient reported.   Edward Aries E Tanielle Emigh, LPN   Review of Systems     Cardiac Risk Factors include: advanced age (>24men, >73 women);diabetes mellitus;dyslipidemia;hypertension;male gender;obesity (BMI >30kg/m2);sedentary lifestyle     Objective:    Today's Vitals   10/08/20 1039  Weight: 227 lb (103 kg)  Height: 5\' 11"  (1.803 m)   Body mass index is 31.66 kg/m.  Advanced Directives 10/08/2020 09/05/2019 09/03/2019 03/25/2018 12/20/2016 12/14/2015 12/14/2015  Does Patient Have a Medical Advance Directive? Yes No No No No No No  Type of Advance Directive [place] in Chart? No - copy requested - - - - - -  Would patient like information on creating a medical advance directive? - No - Patient declined No - Patient declined No - Patient declined No - Patient declined No - patient declined information No - patient declined information    Current Medications (verified) Outpatient  Encounter Medications as of 10/08/2020  Medication Sig  . acetaminophen (TYLENOL) 650 MG CR tablet Take 1,300 mg by mouth every 6 (six) hours as needed for pain.  Marland Kitchen amiodarone (PACERONE) 200 MG tablet Take 200 mg by mouth daily.  Marland Kitchen apixaban (ELIQUIS) 5 MG TABS tablet Take 1 tablet (5 mg total) by mouth 2 (two) times daily. 2 BID X 7 days then 1 BID (Patient taking differently: Take 5 mg by mouth daily.)  . atorvastatin (LIPITOR) 80 MG tablet Take 40 mg by mouth daily.  . Cholecalciferol (VITAMIN D-1000 MAX ST) 1000 units tablet Take 1 tablet (1,000 Units total) by mouth 2 (two) times daily.  . Continuous Blood Gluc Receiver (FREESTYLE LIBRE 14 DAY READER) DEVI 1 each by Does not apply route daily.  . Continuous Blood Gluc Sensor (FREESTYLE LIBRE 14 DAY SENSOR) MISC 1 each by Does not apply route every 14 (fourteen) days.  . cyanocobalamin 1000 MCG tablet Take 1,000 mcg by mouth daily.  . ferrous sulfate 325 (65 FE) MG tablet Take 325 mg by mouth daily with breakfast.   . furosemide (LASIX) 40 MG tablet Take 1 tablet (40 mg total) by mouth 2 (two) times daily. (Patient taking differently: Take 40 mg by mouth daily.)  . gabapentin (NEURONTIN) 300 MG capsule Take 600 mg by mouth 3 (three) times daily.  . insulin aspart (NOVOLOG FLEXPEN) 100 UNIT/ML FlexPen Inject 22 Units into the skin in the morning and at bedtime.  . insulin aspart protamine- aspart (NOVOLOG MIX 70/30) (70-30) 100 UNIT/ML injection Inject 0.2 mLs (20 Units total) into the skin 2 (two) times daily with  a meal. Inject 16 units into the skin in the morning and in the evening.  . insulin glargine (LANTUS) 100 UNIT/ML injection Inject 0.55 mLs (55 Units total) into the skin at bedtime. (Patient taking differently: Inject 40 Units into the skin at bedtime.)  . levothyroxine (SYNTHROID, LEVOTHROID) 125 MCG tablet Take 125 mcg by mouth daily before breakfast.  . loratadine (CLARITIN) 10 MG tablet Take 10 mg by mouth daily.  . Magnesium 500  MG TABS Take 400 mg by mouth daily.  . melatonin 3 MG TABS tablet Take 3 mg by mouth at bedtime.  . metFORMIN (GLUCOPHAGE) 1000 MG tablet Take 1,000 mg by mouth 2 (two) times daily with a meal.   . metoprolol succinate (TOPROL-XL) 100 MG 24 hr tablet Take 1 tablet (100 mg total) by mouth daily. Take with or immediately following a meal.  . nitroGLYCERIN (NITROSTAT) 0.4 MG SL tablet Place 0.4 mg under the tongue every 5 (five) minutes as needed for chest pain.   Marland Kitchen omeprazole (PRILOSEC) 20 MG capsule Take 20 mg by mouth 2 (two) times daily.  . Oxcarbazepine (TRILEPTAL) 300 MG tablet Take 300 mg by mouth 2 (two) times daily.  . sacubitril-valsartan (ENTRESTO) 24-26 MG Take 1 tablet by mouth 2 (two) times daily.   No facility-administered encounter medications on file as of 10/08/2020.    Allergies (verified) Patient has no known allergies.   History: Past Medical History:  Diagnosis Date  . AICD (automatic cardioverter/defibrillator) present   . Arthritis   . CHF (congestive heart failure) ([place])   . Coronary artery disease   . Diabetes mellitus without complication ([place])   . Dysrhythmia   . GERD (gastroesophageal reflux disease)   . Heart attack ([name]) 2004, 2015  . History of kidney stones   . Hyperlipidemia   . Hypertension   . Hypothyroidism   . Lumbar vertebral fracture ([place]) 2012  . NSTEMI, initial episode of care ([place]) 10/09/2013  . Skin cancer 06/2014   left cheek  . Stroke ([place])    x3  . Thyroid disease   . TIA (transient ischemic attack) 2009   Past Surgical History:  Procedure Laterality Date  . CARDIAC DEFIBRILLATOR PLACEMENT  12/2003, Repaired in 2012   x3  . KNEE SURGERY Right 1994  . SKIN CANCER EXCISION  06/2014   had 4 removed  . 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   Procedure: LEFT ELBOW ULNAR NERVE DECOMPRESSION;  Surgeon: [name], MD;  Location: [place];  Service: Orthopedics;  Laterality: Left;   Family History  Problem Relation Age of Onset  .  Heart attack Mother   . Aneurysm Father   . Heart attack Brother 49   Social History   Socioeconomic History  . Marital status: Married    Spouse name: Not on file  . Number of children: 2  . Years of education: Not on file  . Highest education level: Not on file  Occupational History  . Not on file  Tobacco Use  . Smoking status: Never Smoker  . Smokeless tobacco: Current User    Types: Chew  Vaping Use  . Vaping Use: Never used  Substance and Sexual Activity  . Alcohol use: Yes    Comment: occ  . Drug use: No  . Sexual activity: Yes  Other Topics Concern  . Not on file  Social History Narrative   Lives home with wife - daughter and son stay sometimes - one granddaughter lives about an hour away.  Social Determinants of Health   Financial Resource Strain: Low Risk   . Difficulty of Paying Living Expenses: Not hard at all  Food Insecurity: No Food Insecurity  . Worried About Charity fundraiser in the Last Year: Never true  . Ran Out of Food in the Last Year: Never true  Transportation Needs: No Transportation Needs  . Lack of Transportation (Medical): No  . Lack of Transportation (Non-Medical): No  Physical Activity: Sufficiently Active  . Days of Exercise per Week: 7 days  . Minutes of Exercise per Session: 30 min  Stress: No Stress Concern Present  . Feeling of Stress : Not at all  Social Connections: Socially Integrated  . Frequency of Communication with Friends and Family: More than three times a week  . Frequency of Social Gatherings with Friends and Family: More than three times a week  . Attends Religious Services: More than 4 times per year  . Active Member of Clubs or Organizations: Yes  . Attends Archivist Meetings: More than 4 times per year  . Marital Status: Married    Tobacco Counseling Ready to quit: Not Answered Counseling given: Not Answered   Clinical Intake:  Pre-visit preparation completed: Yes  Pain : No/denies pain      BMI - recorded: 31.66 Nutritional Status: BMI > 30  Obese Nutritional Risks: None Diabetes: Yes CBG done?: No Did pt. bring in CBG monitor from home?: No  How often do you need to have someone help you when you read instructions, pamphlets, or other written materials from your doctor or pharmacy?: 1 - Never  Nutrition Risk Assessment:  Has the patient had any N/V/D within the last 2 months?  No  Does the patient have any non-healing wounds?  No  Has the patient had any unintentional weight loss or weight gain?  No   Diabetes:  Is the patient diabetic?  Yes  If diabetic, was a CBG obtained today?  No  Did the patient bring in their glucometer from home?  No  How often do you monitor your CBG's? 2-3 times per day per patient - today 132.   Financial Strains and Diabetes Management:  Are you having any financial strains with the device, your supplies or your medication? No .  Does the patient want to be seen by Chronic Care Management for management of their diabetes?  No  Would the patient like to be referred to a Nutritionist or for Diabetic Management?  No   Diabetic Exams:  Diabetic Eye Exam: Com[facility] 08/2020 at [facility].   Diabetic Foot Exam: Completed 08/2020 at [facility]. Pt has been advised about the importance in completing this exam. Pt is scheduled for diabetic foot exam on 08/2021.    Interpreter Needed?: No  Information entered by :: Pryor Guettler, LPN   Activities of Daily Living In your present state of health, do you have any difficulty performing the following activities: 10/08/2020  Hearing? Y  Vision? N  Difficulty concentrating or making decisions? N  Walking or climbing stairs? Y  Dressing or bathing? N  Doing errands, shopping? N  Preparing Food and eating ? N  Using the Toilet? N  In the past six months, have you accidently leaked urine? N  Do you have problems with loss of bowel control? N  Managing your Medications? N  Managing your Finances? N   Housekeeping or managing your Housekeeping? N  Some recent data might be hidden    Patient Care Team: Stacks,  Cletus Gash, MD as PCP - General (Family Medicine) Dorthula Nettles, Laurence Ferrari, MD as Consulting Physician (Dermatology) Loney Loh, MD as Consulting Physician (Dermatology) Daryel November, MD as Consulting Physician (Urology) Louanna Raw, MD as Referring Physician (Diagnostic Radiology)  Indicate any recent Medical Services you may have received from other than [facility] providers in the past year (date may be approximate).     Assessment:   This is a routine wellness examination for [place].  Hearing/Vision screen  Hearing Screening   125Hz  250Hz  500Hz  1000Hz  2000Hz  3000Hz  4000Hz  6000Hz  8000Hz   Right ear:           Left ear:           Comments: Wears hearing aids - Routine visits at [place] Comments: Wears eyeglasses - Routine visits at [place] - up to date with eye exam  Dietary issues and exercise activities discussed: Current Exercise Habits: Home exercise routine, Type of exercise: stretching;walking, Time (Minutes): 30, Frequency (Times/Week): 7, Weekly Exercise (Minutes/Week): 210, Intensity: Mild, Exercise limited by: orthopedic condition(s)  Goals Addressed            This Visit's Progress   . Exercise 3x per week (30 min per time)   On track    Exercise at the [facility] or walk for 1 hour 3 times per week.      Depression Screen PHQ 2/9 Scores 10/08/2020 06/03/2020 12/11/2018 11/07/2018 03/25/2018 03/05/2018 02/01/2018  PHQ - 2 Score 0 0 0 0 0 0 0  PHQ- 9 Score - - - - - - -  Exception Documentation - - - - - - -    Fall Risk Fall Risk  10/08/2020 06/03/2020 11/11/2019 12/11/2018 11/07/2018  Falls in the past year? 1 0 1 0 1  Number falls in past yr: 0 - 0 - 0  Injury with Fall? 1 - 1 - 1  Comment - - - - -  Risk for fall due to : Impaired balance/gait;Orthopedic patient;History of fall(s);Impaired vision - History of fall(s) - -  Risk for  fall due to: Comment - - - - -  Follow up Education provided;Falls prevention discussed Falls evaluation completed Falls evaluation completed - -    FALL RISK PREVENTION PERTAINING TO THE HOME:  Any stairs in or around the home? No  If so, are there any without handrails? No  Home free of loose throw rugs in walkways, pet beds, electrical cords, etc? Yes  Adequate lighting in your home to reduce risk of falls? Yes   ASSISTIVE DEVICES UTILIZED TO PREVENT FALLS:  Life alert? No  Use of a cane, walker or w/c? Yes  Grab bars in the bathroom? Yes  Shower chair or bench in shower? Yes  Elevated toilet seat or a handicapped toilet? Yes   TIMED UP AND GO:  Was the test performed? No . Telephonic visit.  Cognitive Function: Normal cognitive status assessed by direct observation by this Nurse Health Advisor. No abnormalities found.   MMSE - Mini Mental State Exam 03/25/2018 12/20/2016 12/14/2015 08/05/2014  Orientation to time 5 5 5 5   Orientation to Place 5 5 5 5   Registration 3 3 3 3   Attention/ Calculation 4 4 4 4   Recall 3 3 2 2   Language- name 2 objects 2 2 2 2   Language- repeat 1 1 1 1   Language- follow 3 step command 3 3 3 3   Language- read & follow direction 1 1 1 1   Write a sentence 1  1 1 1   Copy design 1 1 0 1  Total score 29 29 27 28         Immunizations Immunization History  Administered Date(s) Administered  . Influenza Split 04/18/2017  . Influenza, High Dose Seasonal PF 03/06/2013, 01/26/2015  . Influenza,inj,quad, With Preservative 02/21/2019  . Influenza-Unspecified 03/22/2014, 02/17/2016, 02/21/2019  . Moderna SARS-COV2 Booster Vaccination 07/14/2020  . Moderna Sars-Covid-2 Vaccination 07/02/2019, 07/30/2019  . Pneumococcal Conjugate-13 03/22/2014  . Pneumococcal Polysaccharide-23 03/22/2013, 06/05/2013  . Zoster 11/19/2013    TDAP status: Up to date  Flu Vaccine status: Up to date  Pneumococcal vaccine status: Up to date  Covid-19 vaccine status:  Completed vaccines  Qualifies for Shingles Vaccine? Yes   Zostavax completed Yes   Shingrix Completed?: Yes  Screening Tests Health Maintenance  Topic Date Due  . OPHTHALMOLOGY EXAM  Never done  . Hepatitis C Screening  Never done  . COLON CANCER SCREENING ANNUAL FOBT  Never done  . COLONOSCOPY (Pts 45-36yrs Insurance coverage will need to be confirmed)  Never done  . FOOT EXAM  12/20/2017  . COVID-19 Vaccine (4 - Booster for Moderna series) 10/11/2020  . TETANUS/TDAP  06/03/2021 (Originally 12/10/1964)  . HEMOGLOBIN A1C  12/01/2020  . INFLUENZA VACCINE  12/20/2020  . DEXA SCAN  02/02/2026  . PNA vac Low Risk Adult  Completed  . HPV VACCINES  Aged Out    Health Maintenance  Health Maintenance Due  Topic Date Due  . OPHTHALMOLOGY EXAM  Never done  . Hepatitis C Screening  Never done  . COLON CANCER SCREENING ANNUAL FOBT  Never done  . COLONOSCOPY (Pts 45-12yrs Insurance coverage will need to be confirmed)  Never done  . FOOT EXAM  12/20/2017  . COVID-19 Vaccine (4 - Booster for Moderna series) 10/11/2020    Colorectal cancer screening: No longer required.   Lung Cancer Screening: (Low Dose CT Chest recommended if Age 61-80 years, 30 pack-year currently smoking OR have quit w/in 15years.) does not qualify.   Additional Screening:  Hepatitis C Screening: does qualify; Completed at [place]: Recommended annual ophthalmology exams for early detection of glaucoma and other disorders of the eye. Is the patient up to date with their annual eye exam?  Yes  Who is the provider or what is the name of the office in which the patient [facility] If pt is not established with a provider, would they like to be referred to a provider to establish care? No .   Dental Screening: Recommended annual dental exams for proper oral hygiene  Community Resource Referral / Chronic Care Management: CRR required this visit?  No   CCM required this visit?  No       Plan:     I have personally reviewed and noted the following in the patient's chart:   . Medical and social history . Use of alcohol, tobacco or illicit drugs  . Current medications and supplements including opioid prescriptions. Patient is not currently taking opioid prescriptions. . Functional ability and status . Nutritional status . Physical activity . Advanced directives . List of other physicians . Hospitalizations, surgeries, and ER visits in previous 12 months . Vitals . Screenings to include cognitive, depression, and falls . Referrals and appointments  In addition, I have reviewed and discussed with patient certain preventive protocols, quality metrics, and best practice recommendations. A written personalized care plan for preventive services as well as general preventive health recommendations were provided to patient.  Sandrea Hammond, LPN   [id number]   Nurse Notes: None

## 2020-11-01 ENCOUNTER — Encounter: Payer: Self-pay | Admitting: Family Medicine

## 2020-11-01 ENCOUNTER — Other Ambulatory Visit: Payer: Self-pay

## 2020-11-01 ENCOUNTER — Ambulatory Visit (INDEPENDENT_AMBULATORY_CARE_PROVIDER_SITE_OTHER): Payer: Medicare Other | Admitting: Family Medicine

## 2020-11-01 VITALS — BP 107/58 | HR 75 | Temp 98.0°F | Ht 71.0 in | Wt 233.8 lb

## 2020-11-01 DIAGNOSIS — E1159 Type 2 diabetes mellitus with other circulatory complications: Secondary | ICD-10-CM

## 2020-11-01 DIAGNOSIS — E782 Mixed hyperlipidemia: Secondary | ICD-10-CM | POA: Diagnosis not present

## 2020-11-01 DIAGNOSIS — Z95810 Presence of automatic (implantable) cardiac defibrillator: Secondary | ICD-10-CM | POA: Diagnosis not present

## 2020-11-01 DIAGNOSIS — I25118 Atherosclerotic heart disease of native coronary artery with other forms of angina pectoris: Secondary | ICD-10-CM | POA: Diagnosis not present

## 2020-11-01 DIAGNOSIS — R7989 Other specified abnormal findings of blood chemistry: Secondary | ICD-10-CM | POA: Diagnosis not present

## 2020-11-01 DIAGNOSIS — Z794 Long term (current) use of insulin: Secondary | ICD-10-CM | POA: Diagnosis not present

## 2020-11-01 NOTE — Progress Notes (Signed)
Subjective:  Patient ID: Edward Heath, male    DOB: 07-03-1945  Age: 75 y.o. MRN: [id number]  CC: Medical Management of Chronic Issues   HPI Edward Heath presents for annual checkup here.  He generally gets his care through the [facility].  He is followed there for multiple diagnoses including diabetes hypothyroidism hypercholesterolemia and he has a implanted defibrillator.  He is due Depression screen [facility] 2/9 11/01/2020 10/08/2020 06/03/2020  Decreased Interest 0 0 0  Down, Depressed, Hopeless 0 0 0  PHQ - 2 Score 0 0 0  Altered sleeping - - -  Tired, decreased energy - - -  Change in appetite - - -  Feeling bad or failure about yourself  - - -  Trouble concentrating - - -  Moving slowly or fidgety/restless - - -  Suicidal thoughts - - -  PHQ-9 Score - - -  Difficult doing work/chores - - -    History Edward Heath has a past medical history of AICD (automatic cardioverter/defibrillator) present, Arthritis, CHF (congestive heart failure) ([place]), Coronary artery disease, Diabetes mellitus without complication ([place]), Dysrhythmia, GERD (gastroesophageal reflux disease), Heart attack ([place]) (2004, 2015), History of kidney stones, Hyperlipidemia, Hypertension, Hypothyroidism, Lumbar vertebral fracture ([place]) (2012), NSTEMI, initial episode of care [facility]) (10/09/2013), Skin cancer (06/2014), Stroke [facility]), Thyroid disease, and TIA (transient ischemic attack) (2009).   He has a past surgical history that includes Cardiac defibrillator placement (12/2003, Repaired in 2012); Skin cancer excision (06/2014); Knee surgery (Right, 1994); and Ulnar nerve transposition (Left, 09/05/2019).   His family history includes Aneurysm in his father; Heart attack in his mother; Heart attack (age of onset: 2) in his brother.He reports that he has never smoked. His smokeless tobacco use includes chew. He reports current alcohol use. He reports that he does not use drugs.    ROS Review of Systems   Constitutional:  Negative for fever.  Respiratory:  Positive for shortness of breath.   Cardiovascular:  Negative for chest pain.  Musculoskeletal:  Negative for arthralgias.  Skin:  Negative for rash.   Objective:  BP (!) 107/58   Pulse 75   Temp 98 F (36.7 C)   Ht '5\' 11"'  (1.803 m)   Wt 233 lb 12.8 oz (106.1 kg)   SpO2 93%   BMI 32.61 kg/m   BP Readings from Last 3 Encounters:  11/01/20 (!) 107/58  06/03/20 120/69  11/11/19 (!) 115/59    Wt Readings from Last 3 Encounters:  11/01/20 233 lb 12.8 oz (106.1 kg)  10/08/20 227 lb (103 kg)  08/26/20 225 lb (102.1 kg)     Physical Exam Vitals reviewed.  Constitutional:      Appearance: He is well-developed.  HENT:     Head: Normocephalic and atraumatic.     Right Ear: External ear normal.     Left Ear: External ear normal.     Mouth/Throat:     Pharynx: No oropharyngeal exudate or posterior oropharyngeal erythema.  Eyes:     Pupils: Pupils are equal, round, and reactive to light.  Cardiovascular:     Rate and Rhythm: Normal rate and regular rhythm.     Heart sounds: Murmur heard.  Pulmonary:     Effort: No respiratory distress.     Breath sounds: Normal breath sounds.  Musculoskeletal:     Cervical back: Normal range of motion and neck supple.  Neurological:     Mental Status: He is alert and oriented to person, place, and time.  Assessment & Plan:   Arihaan was seen today for medical management of chronic issues.  Diagnoses and all orders for this visit:  Type 2 diabetes mellitus with other circulatory complication, with long-term current use of insulin ([place]) -     Cancel: Bayer DCA Hb A1c Waived -     Cancel: CBC with Differential/Platelet -     Cancel: CMP14+EGFR  Mixed hyperlipidemia -     Cancel: Lipid panel  Elevated serum free T4 level -     Cancel: TSH + free T4  S/P ICD (internal cardiac defibrillator) procedure      I am having Edward Heath maintain his omeprazole,  atorvastatin, Magnesium, metFORMIN, nitroGLYCERIN, gabapentin, Cholecalciferol, apixaban, metoprolol succinate, ferrous sulfate, cyanocobalamin, levothyroxine, amiodarone, furosemide, loratadine, insulin glargine, FreeStyle Libre 14 Day Reader, insulin aspart protamine- aspart, NovoLOG FlexPen, sacubitril-valsartan, melatonin, acetaminophen, FreeStyle Libre 14 Day Sensor, and Oxcarbazepine.  Allergies as of 11/01/2020   No Known Allergies      Medication List        Accurate as of November 01, 2020  9:40 PM. If you have any questions, ask your nurse or doctor.          acetaminophen 650 MG CR tablet Commonly known as: TYLENOL Take 1,300 mg by mouth every 6 (six) hours as needed for pain.   amiodarone 200 MG tablet Commonly known as: PACERONE Take 200 mg by mouth daily.   apixaban 5 MG Tabs tablet Commonly known as: Eliquis Take 1 tablet (5 mg total) by mouth 2 (two) times daily. 2 BID X 7 days then 1 BID What changed:  when to take this additional instructions   atorvastatin 80 MG tablet Commonly known as: LIPITOR Take 40 mg by mouth daily.   Cholecalciferol 25 MCG (1000 UT) tablet Commonly known as: Vitamin D-1000 Max St Take 1 tablet (1,000 Units total) by mouth 2 (two) times daily.   cyanocobalamin 1000 MCG tablet Take 1,000 mcg by mouth daily.   ferrous sulfate 325 (65 FE) MG tablet Take 325 mg by mouth daily with breakfast.   FreeStyle Libre 14 Day Reader [name] 1 each by Does not apply route daily.   FreeStyle Libre 14 Day Sensor Misc 1 each by Does not apply route every 14 (fourteen) days.   furosemide 40 MG tablet Commonly known as: LASIX Take 1 tablet (40 mg total) by mouth 2 (two) times daily. What changed: when to take this   gabapentin 300 MG capsule Commonly known as: NEURONTIN Take 600 mg by mouth 3 (three) times daily.   insulin aspart protamine- aspart (70-30) 100 UNIT/ML injection Commonly known as: NOVOLOG MIX 70/30 Inject 0.2 mLs (20 Units  total) into the skin 2 (two) times daily with a meal. Inject 16 units into the skin in the morning and in the evening.   insulin glargine 100 UNIT/ML injection Commonly known as: Lantus Inject 0.55 mLs (55 Units total) into the skin at bedtime. What changed: how much to take   levothyroxine 125 MCG tablet Commonly known as: SYNTHROID Take 125 mcg by mouth daily before breakfast.   loratadine 10 MG tablet Commonly known as: CLARITIN Take 10 mg by mouth daily.   Magnesium 500 MG Tabs Take 400 mg by mouth daily.   melatonin 3 MG Tabs tablet Take 3 mg by mouth at bedtime.   metFORMIN 1000 MG tablet Commonly known as: GLUCOPHAGE Take 1,000 mg by mouth 2 (two) times daily with a meal.   metoprolol succinate 100 MG  24 hr tablet Commonly known as: TOPROL-XL Take 1 tablet (100 mg total) by mouth daily. Take with or immediately following a meal.   nitroGLYCERIN 0.4 MG SL tablet Commonly known as: NITROSTAT Place 0.4 mg under the tongue every 5 (five) minutes as needed for chest pain.   NovoLOG FlexPen 100 UNIT/ML FlexPen Generic drug: insulin aspart Inject 22 Units into the skin in the morning and at bedtime.   omeprazole 20 MG capsule Commonly known as: PRILOSEC Take 20 mg by mouth 2 (two) times daily.   Oxcarbazepine 300 MG tablet Commonly known as: TRILEPTAL Take 300 mg by mouth 3 (three) times daily.   sacubitril-valsartan 24-26 MG Commonly known as: ENTRESTO Take 1 tablet by mouth 2 (two) times daily.       Patient states that his care is given primarily through the [place].  He only a relationship here for acute same-day and similar illnesses.  He does not want any recommendations for care from me or this practice at this time.  [facility].  Follow-up: Return in about 1 year (around 11/01/2021).  Claretta Fraise, M.D. Annual check up. He

## 2020-12-16 DIAGNOSIS — S22078D Other fracture of T9-T10 vertebra, subsequent encounter for fracture with routine healing: Secondary | ICD-10-CM | POA: Diagnosis not present

## 2020-12-16 DIAGNOSIS — R202 Paresthesia of skin: Secondary | ICD-10-CM | POA: Diagnosis not present

## 2020-12-16 DIAGNOSIS — Z79899 Other long term (current) drug therapy: Secondary | ICD-10-CM | POA: Diagnosis not present

## 2020-12-16 DIAGNOSIS — Z981 Arthrodesis status: Secondary | ICD-10-CM | POA: Diagnosis not present

## 2020-12-16 DIAGNOSIS — Z8781 Personal history of (healed) traumatic fracture: Secondary | ICD-10-CM | POA: Diagnosis not present

## 2020-12-16 DIAGNOSIS — Z4789 Encounter for other orthopedic aftercare: Secondary | ICD-10-CM | POA: Diagnosis not present

## 2020-12-16 DIAGNOSIS — M8588 Other specified disorders of bone density and structure, other site: Secondary | ICD-10-CM | POA: Diagnosis not present

## 2021-03-02 ENCOUNTER — Other Ambulatory Visit: Payer: Self-pay

## 2021-03-02 ENCOUNTER — Encounter: Payer: Self-pay | Admitting: Physical Therapy

## 2021-03-02 ENCOUNTER — Ambulatory Visit: Payer: No Typology Code available for payment source | Attending: Orthopedic Surgery | Admitting: Physical Therapy

## 2021-03-02 DIAGNOSIS — G8929 Other chronic pain: Secondary | ICD-10-CM | POA: Insufficient documentation

## 2021-03-02 DIAGNOSIS — M25512 Pain in left shoulder: Secondary | ICD-10-CM | POA: Diagnosis present

## 2021-03-02 DIAGNOSIS — M25612 Stiffness of left shoulder, not elsewhere classified: Secondary | ICD-10-CM | POA: Diagnosis present

## 2021-03-02 DIAGNOSIS — R252 Cramp and spasm: Secondary | ICD-10-CM | POA: Insufficient documentation

## 2021-03-02 NOTE — Patient Instructions (Signed)
Access Code: [email or web address] URL: [email or web address] Date: 03/02/2021 Prepared by: Almyra Free  Exercises Supine Shoulder Flexion AAROM with Dowel - 3 x daily - 7 x weekly - 1-3 sets - 10 reps Seated Scapular Retraction with External Rotation - 2 x daily - 7 x weekly - 1 sets - 10 reps Seated Scapular Retraction - 2 x daily - 7 x weekly - 1 sets - 10 reps - 2-3 sec hold

## 2021-03-02 NOTE — Therapy (Signed)
[facility] [place], [place], [address] Phone: [phone]   Fax:  [phone]  Physical Therapy Evaluation  Patient Details  Name: Edward Heath MRN: [id number] Date of Birth: [birth year] Referring Provider (PT): Alexzandrew Dara Lords PA-C   Encounter Date: 03/02/2021   PT End of Session - 03/02/21 1029     Visit Number 1    Number of Visits 15    Date for PT Re-Evaluation 06/30/21    Authorization Type VA    Authorization Time Period 15  visits approved for 120 days    PT Start Time 1033    PT Stop Time 1115    PT Time Calculation (min) 42 min    Activity Tolerance Patient tolerated treatment well    Behavior During Therapy [facility] for tasks assessed/performed             Past Medical History:  Diagnosis Date   AICD (automatic cardioverter/defibrillator) present    Arthritis    CHF (congestive heart failure) ([place])    Coronary artery disease    Diabetes mellitus without complication ([place])    Dysrhythmia    GERD (gastroesophageal reflux disease)    Heart attack ([place]) 2004, 2015   History of kidney stones    Hyperlipidemia    Hypertension    Hypothyroidism    Lumbar vertebral fracture ([place]) 2012   NSTEMI, initial episode of care ([place]) 10/09/2013   Skin cancer 06/2014   left cheek   Stroke ([place])    x3   Thyroid disease    TIA (transient ischemic attack) 2009    Past Surgical History:  Procedure Laterality Date   CARDIAC DEFIBRILLATOR PLACEMENT  12/2003, Repaired in 2012   x3   [place]  06/2014   had 4 removed   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   Procedure: LEFT ELBOW ULNAR NERVE DECOMPRESSION;  Surgeon: [name], MD;  Location: [facility];  Service: Orthopedics;  Laterality: Left;    There were no vitals filed for this visit.    Subjective Assessment - 03/02/21 1035     Subjective If it's hanging down it doesn't bother me, but the higher it goes the  worse it is. Had injection at the [place] but it didn't do much good. Started about 4-5 months ago. He's been using a walker since getting out of therapy in November. Was there from August to November. He had back surgery. He uses a walker all the time. Still has radicular sx in right LE. He also reports weakness. Notices when performing sit to stand. He would like to return to golf.    Pertinent History back surgery Aug 2022, cardiac defibrillator, DM CHF    Diagnostic tests xrays    Patient Stated Goals to move shoulder better with less pain    Currently in Pain? Yes    Pain Score 6     Pain Location Shoulder    Pain Orientation Left    Pain Descriptors / Indicators Constant;Aching    Pain Type Acute pain    Pain Onset More than a month ago    Pain Frequency Intermittent    Aggravating Factors  OH use    Pain Relieving Factors rest                [facility] PT Assessment - 03/02/21 0001       Assessment   Medical Diagnosis left adhesive capsulitis    Referring Provider (  PT) Alexzandrew Dara Lords PA-C    Onset Date/Surgical Date 09/30/20    Hand Dominance Right      Precautions   Precautions ICD/Pacemaker      Restrictions   Weight Bearing Restrictions No      Balance Screen   Has the patient fallen in the past 6 months No    Has the patient had a decrease in activity level because of a fear of falling?  Yes    Is the patient reluctant to leave their home because of a fear of falling?  No      Home Environment   Living Environment Private residence    Living Arrangements Spouse/significant other      Prior Function   Level of Independence Independent with household mobility with device    Vocation Retired    Leisure golf      Posture/Postural Control   Posture/Postural Control Postural limitations    Postural Limitations Rounded Shoulders;Forward head;Increased thoracic kyphosis;Flexed trunk      ROM / Strength   AROM / PROM / Strength AROM;PROM;Strength      AROM    AROM Assessment Site Shoulder    Right/Left Shoulder Left    Left Shoulder Flexion 118 Degrees    Left Shoulder ABduction 85 Degrees    Left Shoulder Internal Rotation 78 Degrees    Left Shoulder External Rotation 40 Degrees      PROM   PROM Assessment Site Shoulder    Right/Left Shoulder Left    Left Shoulder Flexion 125 Degrees    Left Shoulder ABduction 105 Degrees    Left Shoulder Internal Rotation 90 Degrees    Left Shoulder External Rotation 52 Degrees      Strength   Strength Assessment Site Shoulder    Right/Left Shoulder Left    Left Shoulder Flexion 5/5    Left Shoulder Extension 5/5    Left Shoulder ABduction 4+/5    Left Shoulder Internal Rotation 5/5    Left Shoulder External Rotation 4+/5      Palpation   Palpation comment subscaupularis muscle belly and insertion, supraspinatus, infraspinatus muscles bellies                        Objective measurements completed on examination: See above findings.                PT Education - 03/02/21 1231     Education Details HEP    Person(s) Educated Patient    Methods Explanation;Demonstration;Handout    Comprehension Verbalized understanding;Returned demonstration                 PT Long Term Goals - 03/02/21 1239       PT LONG TERM GOAL #1   Title Ind with HEP    Status New      PT LONG TERM GOAL #2   Title Patient able to perform [facility] ADLS with functional left shoulder ROM of 145 deg or more of flex/scaption.    Time 12    Period Weeks    Status New      PT LONG TERM GOAL #3   Title Improved left shoulder ER to >=60 degrees to normalize ADLs including reaching behind his head.    Status New      PT LONG TERM GOAL #4   Title Pt able to reach behind his back with his left arm without difficulty.    Status New  Plan - 03/02/21 1124     Clinical Impression Statement Patient presents with c/o left shoulder pain and stiffness x 4-5 months  with insidious onset. He has a h/o left shouder pain for over 10 years and has received injections in the past with good results. His last injection about 6 weeks ago was ineffective. He is limited with left shoulder ER, ABD and flexion affecting ADLS. He has pain over 90 deg flex and ABD and with reaching behind his back with IR and ER. His strength is [facility] and he denies pain with MMT. Patient has significant postural deficits with rounded shoulders, forward head and increased thoracic kyphosis. He has marked tenderness of left subscapularis and supraspinatus muscles. Patient amb with RW at home and in community, but presented in clinic w/c today. He was able to transfer independently for exam. He will benefit from skilled PT to address these deficits.    Personal Factors and Comorbidities Comorbidity 3+    Comorbidities back surgery Aug 2022, cardiac defibrillator, DM CHF    Stability/Clinical Decision Making Stable/Uncomplicated    Clinical Decision Making Low    Rehab Potential Good    PT Frequency 2x / week    PT Duration 8 weeks    PT Treatment/Interventions ADLs/Self Care Home Management;Cryotherapy;Moist Heat;Therapeutic activities;Therapeutic exercise;Neuromuscular re-education;Manual techniques;Dry needling;Patient/family education;Passive range of motion;Joint Manipulations    PT Next Visit Plan Work on left shoulder ROM, postural strength; check A/C joint mobility; would benefit from DN/manual to left subscap and other RC muscles, UT.    PT Home Exercise Plan Access Code: [email or web address]  URL: [email or web address]  Date: 03/02/2021  Prepared by: Almyra Free     Exercises  Supine Shoulder Flexion AAROM with Dowel - 3 x daily - 7 x weekly - 1-3 sets - 10 reps  Seated Scapular Retraction with External Rotation - 2 x daily - 7 x weekly - 1 sets - 10 reps  Seated Scapular Retraction - 2 x daily - 7 x weekly - 1 sets - 10 reps - 2-3 sec hold    Consulted and Agree with Plan of Care Patient              Patient will benefit from skilled therapeutic intervention in order to improve the following deficits and impairments:  Decreased range of motion, Pain, Increased muscle spasms, Impaired UE functional use, Decreased strength, Postural dysfunction, Impaired flexibility  Visit Diagnosis: Stiffness of left shoulder, not elsewhere classified  Chronic left shoulder pain  Cramp and spasm     Problem List Patient Active Problem List   Diagnosis Date Noted   Unilateral primary osteoarthritis, right knee 08/26/2020   S/P fusion of thoracic spine 03/12/2020   S/P cubital tunnel release 09/05/2019   Seborrheic keratoses 07/16/2019   Cubital tunnel syndrome on left 07/03/2019   Numbness and tingling in left hand 06/24/2019   Ulnar neuropathy of left upper extremity 06/24/2019   Absolute anemia 03/05/2018   S/P ICD (internal cardiac defibrillator) procedure 01/17/2018   Type 2 diabetes mellitus ([place]) 12/14/2015   Cardiomyopathy ([place]) 12/14/2015   Hyperlipidemia 12/14/2015   Squamous cell carcinoma of skin of left cheek 10/26/2015   Congestive heart failure ([place]) 10/09/2013   Action tremor 06/04/2013   Personal history of other malignant neoplasm of skin 11/14/2012   Ankylosis of spine 04/20/2011   Fracture of T6 vertebra ([place]) 04/20/2011   Hiatal hernia 09/02/2010   Barrett's esophagus 09/02/2010   Carotid stenosis, bilateral 09/02/2010   Hypertension 08/31/2010   CAD (coronary  artery disease) 08/31/2010   PAD (peripheral artery disease) ([place]) 08/31/2010   DDD (degenerative disc disease), lumbar 08/31/2010   Chronic low back pain 08/31/2010   Actinic keratosis 08/31/2010   Melanoma ([place]) 08/31/2010   Anterior circulation transient ischemic attack 05/22/2009   Stroke ([place]) 10/25/2005    Madelyn Flavors, PT 03/02/2021, 3:09 PM  [facility] [place], [place], [address] Phone: [phone]   Fax:   [phone]  Name: Edward Heath MRN: [id number] Date of Birth: 1946/02/15

## 2021-03-07 ENCOUNTER — Ambulatory Visit: Payer: No Typology Code available for payment source

## 2021-03-07 ENCOUNTER — Other Ambulatory Visit: Payer: Self-pay

## 2021-03-07 DIAGNOSIS — M25612 Stiffness of left shoulder, not elsewhere classified: Secondary | ICD-10-CM | POA: Diagnosis not present

## 2021-03-07 DIAGNOSIS — M25512 Pain in left shoulder: Secondary | ICD-10-CM

## 2021-03-07 DIAGNOSIS — G8929 Other chronic pain: Secondary | ICD-10-CM

## 2021-03-07 NOTE — Therapy (Signed)
[facility] [place], [place], [address] Phone: [phone]   Fax:  [phone]  Physical Therapy Treatment  Patient Details  Name: Edward Heath MRN: [id number] Date of Birth: 23-May-1945 Referring Provider (PT): Alexzandrew Dara Lords PA-C   Encounter Date: 03/07/2021   PT End of Session - 03/07/21 1024     Visit Number 2    Number of Visits 15    Date for PT Re-Evaluation 06/30/21    Authorization Type VA    Authorization Time Period 15  visits approved for 120 days    PT Start Time 1020    PT Stop Time 1107    PT Time Calculation (min) 47 min    Activity Tolerance Patient tolerated treatment well    Behavior During Therapy [facility] for tasks assessed/performed             Past Medical History:  Diagnosis Date   AICD (automatic cardioverter/defibrillator) present    Arthritis    CHF (congestive heart failure) ([place])    Coronary artery disease    Diabetes mellitus without complication ([place])    Dysrhythmia    GERD (gastroesophageal reflux disease)    Heart attack ([place]) 2004, 2015   History of kidney stones    Hyperlipidemia    Hypertension    Hypothyroidism    Lumbar vertebral fracture ([place]) 2012   NSTEMI, initial episode of care ([place]) 10/09/2013   Skin cancer 06/2014   left cheek   Stroke ([place])    x3   Thyroid disease    TIA (transient ischemic attack) 2009    Past Surgical History:  Procedure Laterality Date   CARDIAC DEFIBRILLATOR PLACEMENT  12/2003, Repaired in 2012   x3   [place]  06/2014   had 4 removed   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   Procedure: LEFT ELBOW ULNAR NERVE DECOMPRESSION;  Surgeon: [name], MD;  Location: [facility];  Service: Orthopedics;  Laterality: Left;    There were no vitals filed for this visit.   Subjective Assessment - 03/07/21 1021     Subjective COVID screening completed prior to pt arrival for treatment.  Pt states  that his shoulder is already feeling better than before.    Pertinent History back surgery Aug 2022, cardiac defibrillator, DM CHF    Diagnostic tests xrays    Patient Stated Goals to move shoulder better with less pain    Currently in Pain? Yes    Pain Score 5     Pain Location Shoulder    Pain Descriptors / Indicators Contraction;Aching    Pain Type Acute pain    Pain Onset More than a month ago    Pain Frequency Intermittent    Aggravating Factors  OH use    Pain Relieving Factors rest                               [facility] Adult PT Treatment/Exercise - 03/07/21 0001       Exercises   Exercises Shoulder;Neck      Neck Exercises: Theraband   Scapula Retraction 20 reps   Seated, 2 sets x 10 reps, cues for posture   Other Theraband Exercises Shoulder Shrugs 2 sets x 10 reps, seated   Cues for posture     Shoulder Exercises: Supine   Flexion Both;20 reps;AAROM   Cane   Other Supine Exercises Chest  press w/cane 20 reps      Shoulder Exercises: Seated   Extension Strengthening;Both;20 reps    Theraband Level (Shoulder Extension) Level 2 ([place])    Extension Limitations Cues for technique    Row Strengthening;Both;20 reps;Theraband    Theraband Level (Shoulder Row) Level 2 (Red)      Shoulder Exercises: Pulleys   Flexion 2 minutes   Cues to hold stretch at the top   ABduction 2 minutes   Cues to hold stretch at the top     Shoulder Exercises: ROM/Strengthening   UBE (Upper Arm Bike) 4 min Fwd/Bkd      Manual Therapy   Manual Therapy Soft tissue mobilization;Passive ROM   Cues to relax during PROM   Manual therapy comments Most limited with abduction, marked tightness to B UT and left upper trap, tender to touch, but eased off                          PT Long Term Goals - 03/02/21 1239       PT LONG TERM GOAL #1   Title Ind with HEP    Status New      PT LONG TERM GOAL #2   Title Patient able to perform [facility] ADLS with functional left  shoulder ROM of 145 deg or more of flex/scaption.    Time 12    Period Weeks    Status New      PT LONG TERM GOAL #3   Title Improved left shoulder ER to >=60 degrees to normalize ADLs including reaching behind his head.    Status New      PT LONG TERM GOAL #4   Title Pt able to reach behind his back with his left arm without difficulty.    Status New                   Plan - 03/07/21 1025     Clinical Impression Statement Pt arrives to treatment in wc, but able to perfrom all transfers independently.  Pt states that the stiffness in this left shoulder has decreased slightly with performance of HEP.  Pt requiring cues throughout treatment to not rush through reps with various exercises.  Pt requiring multiple cues to relax during PROM, demonstrating decreased ROM with abduction.  Marked tightness noted to B UT and L upper deltoid.  Pt reported decreased pain and tightness at end of session, 3/10.    Personal Factors and Comorbidities Comorbidity 3+    Comorbidities back surgery Aug 2022, cardiac defibrillator, DM CHF    Stability/Clinical Decision Making Stable/Uncomplicated    Rehab Potential Good    PT Frequency 2x / week    PT Duration 8 weeks    PT Treatment/Interventions ADLs/Self Care Home Management;Cryotherapy;Moist Heat;Therapeutic activities;Therapeutic exercise;Neuromuscular re-education;Manual techniques;Dry needling;Patient/family education;Passive range of motion;Joint Manipulations    PT Next Visit Plan Work on left shoulder ROM, postural strength; check A/C joint mobility; would benefit from DN/manual to left subscap and other RC muscles, UT.    PT Home Exercise Plan Access Code: [email or web address]  URL: [email or web address]  Date: 03/02/2021  Prepared by: Almyra Free     Exercises  Supine Shoulder Flexion AAROM with Dowel - 3 x daily - 7 x weekly - 1-3 sets - 10 reps  Seated Scapular Retraction with External Rotation - 2 x daily - 7 x weekly - 1 sets - 10 reps   Seated Scapular Retraction - 2 x daily -  7 x weekly - 1 sets - 10 reps - 2-3 sec hold    Consulted and Agree with Plan of Care Patient             Patient will benefit from skilled therapeutic intervention in order to improve the following deficits and impairments:  Decreased range of motion, Pain, Increased muscle spasms, Impaired UE functional use, Decreased strength, Postural dysfunction, Impaired flexibility  Visit Diagnosis: Stiffness of left shoulder, not elsewhere classified  Chronic left shoulder pain     Problem List Patient Active Problem List   Diagnosis Date Noted   Unilateral primary osteoarthritis, right knee 08/26/2020   S/P fusion of thoracic spine 03/12/2020   S/P cubital tunnel release 09/05/2019   Seborrheic keratoses 07/16/2019   Cubital tunnel syndrome on left 07/03/2019   Numbness and tingling in left hand 06/24/2019   Ulnar neuropathy of left upper extremity 06/24/2019   Absolute anemia 03/05/2018   S/P ICD (internal cardiac defibrillator) procedure 01/17/2018   Type 2 diabetes mellitus ([place]) 12/14/2015   Cardiomyopathy ([place]) 12/14/2015   Hyperlipidemia 12/14/2015   Squamous cell carcinoma of skin of left cheek 10/26/2015   Congestive heart failure ([place]) 10/09/2013   Action tremor 06/04/2013   Personal history of other malignant neoplasm of skin 11/14/2012   Ankylosis of spine 04/20/2011   Fracture of T6 vertebra ([place]) 04/20/2011   Hiatal hernia 09/02/2010   Barrett's esophagus 09/02/2010   Carotid stenosis, bilateral 09/02/2010   Hypertension 08/31/2010   CAD (coronary artery disease) 08/31/2010   PAD (peripheral artery disease) ([place]) 08/31/2010   DDD (degenerative disc disease), lumbar 08/31/2010   Chronic low back pain 08/31/2010   Actinic keratosis 08/31/2010   Melanoma ([place]) 08/31/2010   Anterior circulation transient ischemic attack 05/22/2009   Stroke ([place]) 10/25/2005    Kathrynn Ducking, PTA 03/07/2021, [facility] [address] [place], [place], [address] Phone: [phone]   Fax:  [phone]  Name: CONSTANTINOS KREMPASKY MRN: [id number] Date of Birth: 25-Nov-1945

## 2021-03-09 ENCOUNTER — Other Ambulatory Visit: Payer: Self-pay

## 2021-03-09 ENCOUNTER — Encounter: Payer: Self-pay | Admitting: Physical Therapy

## 2021-03-09 ENCOUNTER — Ambulatory Visit: Payer: No Typology Code available for payment source | Admitting: Physical Therapy

## 2021-03-09 DIAGNOSIS — M25512 Pain in left shoulder: Secondary | ICD-10-CM

## 2021-03-09 DIAGNOSIS — G8929 Other chronic pain: Secondary | ICD-10-CM

## 2021-03-09 DIAGNOSIS — R252 Cramp and spasm: Secondary | ICD-10-CM

## 2021-03-09 DIAGNOSIS — M25612 Stiffness of left shoulder, not elsewhere classified: Secondary | ICD-10-CM | POA: Diagnosis not present

## 2021-03-09 NOTE — Therapy (Signed)
[facility] [place], [place], [address] Phone: [phone]   Fax:  [phone]  Physical Therapy Treatment  Patient Details  Name: Edward Heath MRN: [id number] Date of Birth: November 05, 1945 Referring Provider (PT): Alexzandrew Dara Lords PA-C   Encounter Date: 03/09/2021   PT End of Session - 03/09/21 1033     Visit Number 3    Number of Visits 15    Date for PT Re-Evaluation 06/30/21    Authorization Type VA    Authorization Time Period 15  visits approved for 120 days    PT Start Time 1031    PT Stop Time 1112    PT Time Calculation (min) 41 min    Activity Tolerance Patient tolerated treatment well    Behavior During Therapy [facility] for tasks assessed/performed             Past Medical History:  Diagnosis Date   AICD (automatic cardioverter/defibrillator) present    Arthritis    CHF (congestive heart failure) ([place])    Coronary artery disease    Diabetes mellitus without complication ([place])    Dysrhythmia    GERD (gastroesophageal reflux disease)    Heart attack ([place]) 2004, 2015   History of kidney stones    Hyperlipidemia    Hypertension    Hypothyroidism    Lumbar vertebral fracture ([place]) 2012   NSTEMI, initial episode of care ([place]) 10/09/2013   Skin cancer 06/2014   left cheek   Stroke ([place])    x3   Thyroid disease    TIA (transient ischemic attack) 2009    Past Surgical History:  Procedure Laterality Date   CARDIAC DEFIBRILLATOR PLACEMENT  12/2003, Repaired in 2012   x3   [place]  06/2014   had 4 removed   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   Procedure: LEFT ELBOW ULNAR NERVE DECOMPRESSION;  Surgeon: [name], MD;  Location: [place];  Service: Orthopedics;  Laterality: Left;    There were no vitals filed for this visit.   Subjective Assessment - 03/09/21 1032     Subjective COVID screening completed prior to pt arrival for treatment.  Pt states  that he still has some discomfort with certain motions.    Pertinent History back surgery Aug 2022, cardiac defibrillator, DM CHF    Diagnostic tests xrays    Patient Stated Goals to move shoulder better with less pain    Currently in Pain? No/denies                [facility] PT Assessment - 03/09/21 0001       Assessment   Medical Diagnosis left adhesive capsulitis    Referring Provider (PT) Alexzandrew Dara Lords PA-C    Onset Date/Surgical Date 09/30/20    Hand Dominance Right      Precautions   Precautions ICD/Pacemaker      Restrictions   Weight Bearing Restrictions No                           [facility] Adult PT Treatment/Exercise - 03/09/21 0001       Shoulder Exercises: Seated   Extension Strengthening;Left;20 reps;Theraband    Theraband Level (Shoulder Extension) Level 2 (Red)    Protraction AAROM;Both;20 reps    External Rotation Strengthening;Left;20 reps;Theraband    Theraband Level (Shoulder External Rotation) Level 2 (Red)    Internal Rotation Strengthening;Left;20 reps;Theraband    Theraband  Level (Shoulder Internal Rotation) Level 2 (Red)    Flexion AROM;Left;20 reps    Flexion Limitations AAROM flexion x20 reps      Shoulder Exercises: Pulleys   Flexion 3 minutes    ABduction 3 minutes      Shoulder Exercises: ROM/Strengthening   UBE (Upper Arm Bike) 90 RPM x8 min (forward/backward)    Ranger Seated; flex/ext, CW/CCW circles                          PT Long Term Goals - 03/02/21 1239       PT LONG TERM GOAL #1   Title Ind with HEP    Status New      PT LONG TERM GOAL #2   Title Patient able to perform [facility] ADLS with functional left shoulder ROM of 145 deg or more of flex/scaption.    Time 12    Period Weeks    Status New      PT LONG TERM GOAL #3   Title Improved left shoulder ER to >=60 degrees to normalize ADLs including reaching behind his head.    Status New      PT LONG TERM GOAL #4   Title Pt able to  reach behind his back with his left arm without difficulty.    Status New                   Plan - 03/09/21 1339     Clinical Impression Statement Patient presented in [facility] but is able to independently transfer. Only reports of intermittant L shoulder pain with motions such as abduction. Patient able to tolerate therex fairly well with no complaints of pain but intermittant fatigue and rest breaks.    Personal Factors and Comorbidities Comorbidity 3+    Comorbidities back surgery Aug 2022, cardiac defibrillator, DM CHF    Stability/Clinical Decision Making Stable/Uncomplicated    Rehab Potential Good    PT Frequency 2x / week    PT Duration 8 weeks    PT Treatment/Interventions ADLs/Self Care Home Management;Cryotherapy;Moist Heat;Therapeutic activities;Therapeutic exercise;Neuromuscular re-education;Manual techniques;Dry needling;Patient/family education;Passive range of motion;Joint Manipulations    PT Next Visit Plan Work on left shoulder ROM, postural strength; check A/C joint mobility; would benefit from DN/manual to left subscap and other RC muscles, UT.    PT Home Exercise Plan Access Code: [email or web address]  URL: [email or web address]  Date: 03/02/2021  Prepared by: Almyra Free     Exercises  Supine Shoulder Flexion AAROM with Dowel - 3 x daily - 7 x weekly - 1-3 sets - 10 reps  Seated Scapular Retraction with External Rotation - 2 x daily - 7 x weekly - 1 sets - 10 reps  Seated Scapular Retraction - 2 x daily - 7 x weekly - 1 sets - 10 reps - 2-3 sec hold    Consulted and Agree with Plan of Care Patient             Patient will benefit from skilled therapeutic intervention in order to improve the following deficits and impairments:  Decreased range of motion, Pain, Increased muscle spasms, Impaired UE functional use, Decreased strength, Postural dysfunction, Impaired flexibility  Visit Diagnosis: Stiffness of left shoulder, not elsewhere classified  Chronic left shoulder  pain  Cramp and spasm     Problem List Patient Active Problem List   Diagnosis Date Noted   Unilateral primary osteoarthritis, right knee 08/26/2020   S/P fusion of thoracic spine 03/12/2020  S/P cubital tunnel release 09/05/2019   Seborrheic keratoses 07/16/2019   Cubital tunnel syndrome on left 07/03/2019   Numbness and tingling in left hand 06/24/2019   Ulnar neuropathy of left upper extremity 06/24/2019   Absolute anemia 03/05/2018   S/P ICD (internal cardiac defibrillator) procedure 01/17/2018   Type 2 diabetes mellitus ([place]) 12/14/2015   Cardiomyopathy ([place]) 12/14/2015   Hyperlipidemia 12/14/2015   Squamous cell carcinoma of skin of left cheek 10/26/2015   Congestive heart failure ([place]) 10/09/2013   Action tremor 06/04/2013   Personal history of other malignant neoplasm of skin 11/14/2012   Ankylosis of spine 04/20/2011   Fracture of T6 vertebra ([place]) 04/20/2011   Hiatal hernia 09/02/2010   Barrett's esophagus 09/02/2010   Carotid stenosis, bilateral 09/02/2010   Hypertension 08/31/2010   CAD (coronary artery disease) 08/31/2010   PAD (peripheral artery disease) ([place]) 08/31/2010   DDD (degenerative disc disease), lumbar 08/31/2010   Chronic low back pain 08/31/2010   Actinic keratosis 08/31/2010   Melanoma ([place]) 08/31/2010   Anterior circulation transient ischemic attack 05/22/2009   Stroke ([place]) 10/25/2005    Standley Brooking, PTA 03/09/2021, 1:41 PM  [facility] [place], [place], [address] Phone: [phone]   Fax:  [phone]  Name: Edward Heath MRN: [id number] Date of Birth: Dec 06, 1945

## 2021-03-14 ENCOUNTER — Other Ambulatory Visit: Payer: Self-pay

## 2021-03-14 ENCOUNTER — Ambulatory Visit: Payer: No Typology Code available for payment source

## 2021-03-14 DIAGNOSIS — M25612 Stiffness of left shoulder, not elsewhere classified: Secondary | ICD-10-CM

## 2021-03-14 DIAGNOSIS — M25512 Pain in left shoulder: Secondary | ICD-10-CM

## 2021-03-14 DIAGNOSIS — G8929 Other chronic pain: Secondary | ICD-10-CM

## 2021-03-14 NOTE — Therapy (Signed)
[facility] [place], [place], [address] Phone: [phone]   Fax:  [phone]  Physical Therapy Treatment  Patient Details  Name: Edward Heath MRN: [id number] Date of Birth: [birth year] Referring Provider (PT): Alexzandrew Dara Lords PA-C   Encounter Date: 03/14/2021   PT End of Session - 03/14/21 1009     Visit Number 4    Number of Visits 15    Date for PT Re-Evaluation 06/30/21    Authorization Type VA    Authorization Time Period 15  visits approved for 120 days    PT Start Time 1015    PT Stop Time 1101    PT Time Calculation (min) 46 min    Activity Tolerance Patient tolerated treatment well    Behavior During Therapy [facility] for tasks assessed/performed             Past Medical History:  Diagnosis Date   AICD (automatic cardioverter/defibrillator) present    Arthritis    CHF (congestive heart failure) ([place])    Coronary artery disease    Diabetes mellitus without complication ([place])    Dysrhythmia    GERD (gastroesophageal reflux disease)    Heart attack ([place]) 2004, 2015   History of kidney stones    Hyperlipidemia    Hypertension    Hypothyroidism    Lumbar vertebral fracture ([place]) 2012   NSTEMI, initial episode of care ([place]) 10/09/2013   Skin cancer 06/2014   left cheek   Stroke ([place])    x3   Thyroid disease    TIA (transient ischemic attack) 2009    Past Surgical History:  Procedure Laterality Date   CARDIAC DEFIBRILLATOR PLACEMENT  12/2003, Repaired in 2012   x3   [place]  06/2014   had 4 removed   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   Procedure: LEFT ELBOW ULNAR NERVE DECOMPRESSION;  Surgeon: [name], MD;  Location: [facility];  Service: Orthopedics;  Laterality: Left;    There were no vitals filed for this visit.   Subjective Assessment - 03/14/21 1008     Subjective COVID screening completed prior to pt arrival for treatment.  Pt denies  any shoulder pain upon arriving for today's treatment session.    Pertinent History back surgery Aug 2022, cardiac defibrillator, DM CHF    Diagnostic tests xrays    Patient Stated Goals to move shoulder better with less pain    Currently in Pain? No/denies                               [facility] Adult PT Treatment/Exercise - 03/14/21 0001       Exercises   Exercises Shoulder      Shoulder Exercises: Seated   Extension Strengthening;Both;20 reps;Theraband    Theraband Level (Shoulder Extension) Level 2 (Red)    Extension Limitations Cues for posture    Row Strengthening;Both;20 reps;Theraband    Theraband Level (Shoulder Row) Level 2 (Red)    Horizontal ABduction Both;20 reps;Theraband    Theraband Level (Shoulder Horizontal ABduction) Level 2 (Red)    External Rotation Strengthening;Left;20 reps;Theraband    Theraband Level (Shoulder External Rotation) Level 2 (Red)    Internal Rotation Left;20 reps;Theraband   Cues for eccentric control   Theraband Level (Shoulder Internal Rotation) Level 2 (Red)    Flexion AROM;Left;10 reps   10 sec hold     Shoulder Exercises:  Pulleys   Flexion 3 minutes   Cues for pacing   ABduction 3 minutes      Shoulder Exercises: ROM/Strengthening   UBE (Upper Arm Bike) 90 RPM x 8 mins (forward/backward)    Ranger Seated, flex/ext, left/right, CW/CCW circles   3 mins each                         PT Long Term Goals - 03/02/21 1239       PT LONG TERM GOAL #1   Title Ind with HEP    Status New      PT LONG TERM GOAL #2   Title Patient able to perform [facility] ADLS with functional left shoulder ROM of 145 deg or more of flex/scaption.    Time 12    Period Weeks    Status New      PT LONG TERM GOAL #3   Title Improved left shoulder ER to >=60 degrees to normalize ADLs including reaching behind his head.    Status New      PT LONG TERM GOAL #4   Title Pt able to reach behind his back with his left arm without  difficulty.    Status New                   Plan - 03/14/21 1009     Clinical Impression Statement Pt ambulates into the facility with RW today, with noticable antalgic gait on right.  Pt reports intermittent L shoulder "burning" pain with abdcution during today's treatment session.  Pt requiring cues to pacing with numerous exercises as he tends to rush throughout reps.  Also requiring cues for eccentric control with various theraband exercises.  Pt requiring several seated rest breaks due to fatigue.  Pt would benefit from continuation of current POC in order to address limitations and assist with returning to PLOF.    Personal Factors and Comorbidities Comorbidity 3+    Comorbidities back surgery Aug 2022, cardiac defibrillator, DM CHF    Stability/Clinical Decision Making Stable/Uncomplicated    Rehab Potential Good    PT Frequency 2x / week    PT Duration 8 weeks    PT Treatment/Interventions ADLs/Self Care Home Management;Cryotherapy;Moist Heat;Therapeutic activities;Therapeutic exercise;Neuromuscular re-education;Manual techniques;Dry needling;Patient/family education;Passive range of motion;Joint Manipulations    PT Next Visit Plan Work on left shoulder ROM, postural strength; check A/C joint mobility; would benefit from DN/manual to left subscap and other RC muscles, UT.    PT Home Exercise Plan Access Code: [email or web address]  URL: [email or web address]  Date: 03/02/2021  Prepared by: Almyra Free     Exercises  Supine Shoulder Flexion AAROM with Dowel - 3 x daily - 7 x weekly - 1-3 sets - 10 reps  Seated Scapular Retraction with External Rotation - 2 x daily - 7 x weekly - 1 sets - 10 reps  Seated Scapular Retraction - 2 x daily - 7 x weekly - 1 sets - 10 reps - 2-3 sec hold    Consulted and Agree with Plan of Care Patient             Patient will benefit from skilled therapeutic intervention in order to improve the following deficits and impairments:  Decreased range of  motion, Pain, Increased muscle spasms, Impaired UE functional use, Decreased strength, Postural dysfunction, Impaired flexibility  Visit Diagnosis: Stiffness of left shoulder, not elsewhere classified  Chronic left shoulder pain     Problem List Patient Active Problem List  Diagnosis Date Noted   Unilateral primary osteoarthritis, right knee 08/26/2020   S/P fusion of thoracic spine 03/12/2020   S/P cubital tunnel release 09/05/2019   Seborrheic keratoses 07/16/2019   Cubital tunnel syndrome on left 07/03/2019   Numbness and tingling in left hand 06/24/2019   Ulnar neuropathy of left upper extremity 06/24/2019   Absolute anemia 03/05/2018   S/P ICD (internal cardiac defibrillator) procedure 01/17/2018   Type 2 diabetes mellitus ([place]) 12/14/2015   Cardiomyopathy ([place]) 12/14/2015   Hyperlipidemia 12/14/2015   Squamous cell carcinoma of skin of left cheek 10/26/2015   Congestive heart failure ([place]) 10/09/2013   Action tremor 06/04/2013   Personal history of other malignant neoplasm of skin 11/14/2012   Ankylosis of spine 04/20/2011   Fracture of T6 vertebra ([place]) 04/20/2011   Hiatal hernia 09/02/2010   Barrett's esophagus 09/02/2010   Carotid stenosis, bilateral 09/02/2010   Hypertension 08/31/2010   CAD (coronary artery disease) 08/31/2010   PAD (peripheral artery disease) ([place]) 08/31/2010   DDD (degenerative disc disease), lumbar 08/31/2010   Chronic low back pain 08/31/2010   Actinic keratosis 08/31/2010   Melanoma ([place]) 08/31/2010   Anterior circulation transient ischemic attack 05/22/2009   Stroke ([place]) 10/25/2005    Kathrynn Ducking, PTA 03/14/2021, 11:07 AM  [facility] [place], [place], [address] Phone: [phone]   Fax:  [phone]  Name: Edward Heath MRN: [id number] Date of Birth: January 06, 1946

## 2021-03-21 ENCOUNTER — Other Ambulatory Visit: Payer: Self-pay

## 2021-03-21 ENCOUNTER — Ambulatory Visit: Payer: No Typology Code available for payment source

## 2021-03-21 DIAGNOSIS — M25612 Stiffness of left shoulder, not elsewhere classified: Secondary | ICD-10-CM | POA: Diagnosis not present

## 2021-03-21 DIAGNOSIS — M25512 Pain in left shoulder: Secondary | ICD-10-CM

## 2021-03-21 DIAGNOSIS — G8929 Other chronic pain: Secondary | ICD-10-CM

## 2021-03-21 NOTE — Therapy (Signed)
[facility] [place], [place], [address] Phone: [phone]   Fax:  [phone]  Physical Therapy Treatment  Patient Details  Name: Edward Heath MRN: [id number] Date of Birth: Sep 16, 1945 Referring Provider (PT): Alexzandrew Dara Lords PA-C   Encounter Date: 03/21/2021   PT End of Session - 03/21/21 1010     Visit Number 5    Number of Visits 15    Date for PT Re-Evaluation 06/30/21    Authorization Type VA    Authorization Time Period 15  visits approved for 120 days    PT Start Time 1030    PT Stop Time 1115    PT Time Calculation (min) 45 min    Activity Tolerance Patient tolerated treatment well    Behavior During Therapy [facility] for tasks assessed/performed             Past Medical History:  Diagnosis Date   AICD (automatic cardioverter/defibrillator) present    Arthritis    CHF (congestive heart failure) ([place])    Coronary artery disease    Diabetes mellitus without complication ([place])    Dysrhythmia    GERD (gastroesophageal reflux disease)    Heart attack ([place]) 2004, 2015   History of kidney stones    Hyperlipidemia    Hypertension    Hypothyroidism    Lumbar vertebral fracture ([place]) 2012   NSTEMI, initial episode of care ([place]) 10/09/2013   Skin cancer 06/2014   left cheek   Stroke ([place])    x3   Thyroid disease    TIA (transient ischemic attack) 2009    Past Surgical History:  Procedure Laterality Date   CARDIAC DEFIBRILLATOR PLACEMENT  12/2003, Repaired in 2012   x3   [place]  06/2014   had 4 removed   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   Procedure: LEFT ELBOW ULNAR NERVE DECOMPRESSION;  Surgeon: [name], MD;  Location: [facility];  Service: Orthopedics;  Laterality: Left;    There were no vitals filed for this visit.   Subjective Assessment - 03/21/21 1009     Subjective COVID screening completed prior to pt arrival for treatment.  Pt reports  8/10 low back pain today, due to a fall yesterday while outside doing yard work.  Pt encouraged to see doctor about back pain. Pt states "I will when it brings tears to my eyes."    Pertinent History back surgery Aug 2022, cardiac defibrillator, DM CHF    Diagnostic tests xrays    Patient Stated Goals to move shoulder better with less pain    Currently in Pain? Yes    Pain Score 8     Pain Location Back    Pain Orientation Lower;Mid                               [facility] Adult PT Treatment/Exercise - 03/21/21 0001       Exercises   Exercises Shoulder      Neck Exercises: Machines for Strengthening   UBE (Upper Arm Bike) 8 mins   4 mins forward/4 mins backward     Neck Exercises: Theraband   Scapula Retraction 20 reps    Shoulder Extension 20 reps;Red   Cues for posture   Rows 20 reps;Red   Cues for posture and technique   Shoulder External Rotation 20 reps;Red   Cues to keep elbows at his side  Horizontal ABduction 20 reps;Red   cues for eccentric control   Other Theraband Exercises Shoulder shrugs, 20 reps, seated   Cues for posture and technique     Neck Exercises: Seated   Upper Extremity D2 20 reps;Theraband   Right   Theraband Level (UE D2) Level 2 (Red)    UE D2 Limitations heavy cues for technique      Shoulder Exercises: Pulleys   Flexion 3 minutes    ABduction 3 minutes      Shoulder Exercises: ROM/Strengthening   Ranger Seated; flex/ext, left/right, CW/CCW circles      Manual Therapy   Manual Therapy Passive ROM    Passive ROM Seated due to back pain, all directions                          PT Long Term Goals - 03/02/21 1239       PT LONG TERM GOAL #1   Title Ind with HEP    Status New      PT LONG TERM GOAL #2   Title Patient able to perform [facility] ADLS with functional left shoulder ROM of 145 deg or more of flex/scaption.    Time 12    Period Weeks    Status New      PT LONG TERM GOAL #3   Title Improved left  shoulder ER to >=60 degrees to normalize ADLs including reaching behind his head.    Status New      PT LONG TERM GOAL #4   Title Pt able to reach behind his back with his left arm without difficulty.    Status New                   Plan - 03/21/21 1010     Clinical Impression Statement Pt arrives to today's treatment session reporting 8/10 low bcak pain due to a fall he experienced yesterday while performing yard work.  Pt ambulates into the facility with personal RW.  Pt reports that his shoulder is slightly stiff this morning, but is nothing compared to his back.  Pt tolerated performance of numerous theraband shoulder exercises without complaint.  Pt requiring heavy postural cues with all seated activities.  Pt encouraged to reach out to his PCP about back pain and fall.  Pt continues to require several rest breaks throughout session.    Personal Factors and Comorbidities Comorbidity 3+    Comorbidities back surgery Aug 2022, cardiac defibrillator, DM CHF    Stability/Clinical Decision Making Stable/Uncomplicated    Rehab Potential Good    PT Frequency 2x / week    PT Duration 8 weeks    PT Treatment/Interventions ADLs/Self Care Home Management;Cryotherapy;Moist Heat;Therapeutic activities;Therapeutic exercise;Neuromuscular re-education;Manual techniques;Dry needling;Patient/family education;Passive range of motion;Joint Manipulations    PT Next Visit Plan Work on left shoulder ROM, postural strength; check A/C joint mobility; would benefit from DN/manual to left subscap and other RC muscles, UT.    PT Home Exercise Plan Access Code: [email or web address]  URL: [email or web address]  Date: 03/02/2021  Prepared by: Almyra Free     Exercises  Supine Shoulder Flexion AAROM with Dowel - 3 x daily - 7 x weekly - 1-3 sets - 10 reps  Seated Scapular Retraction with External Rotation - 2 x daily - 7 x weekly - 1 sets - 10 reps  Seated Scapular Retraction - 2 x daily - 7 x weekly - 1 sets - 10  reps - 2-3 sec hold  Consulted and Agree with Plan of Care Patient             Patient will benefit from skilled therapeutic intervention in order to improve the following deficits and impairments:  Decreased range of motion, Pain, Increased muscle spasms, Impaired UE functional use, Decreased strength, Postural dysfunction, Impaired flexibility  Visit Diagnosis: Stiffness of left shoulder, not elsewhere classified  Chronic left shoulder pain     Problem List Patient Active Problem List   Diagnosis Date Noted   Unilateral primary osteoarthritis, right knee 08/26/2020   S/P fusion of thoracic spine 03/12/2020   S/P cubital tunnel release 09/05/2019   Seborrheic keratoses 07/16/2019   Cubital tunnel syndrome on left 07/03/2019   Numbness and tingling in left hand 06/24/2019   Ulnar neuropathy of left upper extremity 06/24/2019   Absolute anemia 03/05/2018   S/P ICD (internal cardiac defibrillator) procedure 01/17/2018   Type 2 diabetes mellitus ([place]) 12/14/2015   Cardiomyopathy ([place]) 12/14/2015   Hyperlipidemia 12/14/2015   Squamous cell carcinoma of skin of left cheek 10/26/2015   Congestive heart failure ([place]) 10/09/2013   Action tremor 06/04/2013   Personal history of other malignant neoplasm of skin 11/14/2012   Ankylosis of spine 04/20/2011   Fracture of T6 vertebra ([place]) 04/20/2011   Hiatal hernia 09/02/2010   Barrett's esophagus 09/02/2010   Carotid stenosis, bilateral 09/02/2010   Hypertension 08/31/2010   CAD (coronary artery disease) 08/31/2010   PAD (peripheral artery disease) ([place]) 08/31/2010   DDD (degenerative disc disease), lumbar 08/31/2010   Chronic low back pain 08/31/2010   Actinic keratosis 08/31/2010   Melanoma ([place]) 08/31/2010   Anterior circulation transient ischemic attack 05/22/2009   Stroke ([place]) 10/25/2005    Kathrynn Ducking, PTA 03/21/2021, 11:19 AM  [facility] [place], [place], [address] Phone: [phone]   Fax:  [phone]  Name: Edward Heath MRN: [id number] Date of Birth: August 19, 1945

## 2021-03-23 ENCOUNTER — Other Ambulatory Visit: Payer: Self-pay

## 2021-03-23 ENCOUNTER — Ambulatory Visit: Payer: No Typology Code available for payment source | Attending: Orthopedic Surgery

## 2021-03-23 DIAGNOSIS — G8929 Other chronic pain: Secondary | ICD-10-CM | POA: Insufficient documentation

## 2021-03-23 DIAGNOSIS — M25612 Stiffness of left shoulder, not elsewhere classified: Secondary | ICD-10-CM | POA: Insufficient documentation

## 2021-03-23 DIAGNOSIS — M25512 Pain in left shoulder: Secondary | ICD-10-CM | POA: Diagnosis present

## 2021-03-23 NOTE — Therapy (Signed)
[facility] [place], [place], [address] Phone: [phone]   Fax:  [phone]  Physical Therapy Treatment  Patient Details  Name: Edward Heath MRN: [id number] Date of Birth: 1946/03/01 Referring Provider (PT): Alexzandrew Dara Lords PA-C   Encounter Date: 03/23/2021   PT End of Session - 03/23/21 1014     Visit Number 6    Number of Visits 15    Date for PT Re-Evaluation 06/30/21    Authorization Type VA    Authorization Time Period 15  visits approved for 120 days    PT Start Time 1020    PT Stop Time 1103    PT Time Calculation (min) 43 min    Activity Tolerance Patient tolerated treatment well    Behavior During Therapy [facility] for tasks assessed/performed             Past Medical History:  Diagnosis Date   AICD (automatic cardioverter/defibrillator) present    Arthritis    CHF (congestive heart failure) ([place])    Coronary artery disease    Diabetes mellitus without complication ([place])    Dysrhythmia    GERD (gastroesophageal reflux disease)    Heart attack ([place]) 2004, 2015   History of kidney stones    Hyperlipidemia    Hypertension    Hypothyroidism    Lumbar vertebral fracture ([place]) 2012   NSTEMI, initial episode of care ([place]) 10/09/2013   Skin cancer 06/2014   left cheek   Stroke ([place])    x3   Thyroid disease    TIA (transient ischemic attack) 2009    Past Surgical History:  Procedure Laterality Date   CARDIAC DEFIBRILLATOR PLACEMENT  12/2003, Repaired in 2012   x3   [place]  06/2014   had 4 removed   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   Procedure: LEFT ELBOW ULNAR NERVE DECOMPRESSION;  Surgeon: [name], MD;  Location: [facility];  Service: Orthopedics;  Laterality: Left;    There were no vitals filed for this visit.   Subjective Assessment - 03/23/21 1014     Subjective COVID screening completed prior to pt arrival for treatment.  Pt denied any  shoulder pain upon arriving for today's treatment session, but endorses 5/10 low back pain from fall over the weekend.    Pertinent History back surgery Aug 2022, cardiac defibrillator, DM CHF    Diagnostic tests xrays    Patient Stated Goals to move shoulder better with less pain    Currently in Pain? Yes    Pain Score 5     Pain Location Back    Pain Orientation Mid;Lower                               [facility] Adult PT Treatment/Exercise - 03/23/21 0001       Exercises   Exercises Shoulder      Neck Exercises: Machines for Strengthening   UBE (Upper Arm Bike) 8 mins   4 mins forward/4 mins backward     Neck Exercises: Theraband   Scapula Retraction 20 reps    Shoulder Extension 20 reps;Red   cues for posture   Rows 20 reps;Red   Cues for posture   Shoulder External Rotation 20 reps;Red   Cues to keep elbows at side   Horizontal ABduction 20 reps;Red   Cues for eccentric control   Other Theraband Exercises Shoulder  shrug   20 reps, seated     Neck Exercises: Seated   Upper Extremity D2 20 reps;Theraband    Theraband Level (UE D2) Level 2 (Red)    UE D2 Limitations cues for technique      Shoulder Exercises: Pulleys   Flexion 3 minutes    ABduction 3 minutes      Shoulder Exercises: ROM/Strengthening   Ranger Seated; flex/ext, left/right, CW/CCW circles                          PT Long Term Goals - 03/02/21 1239       PT LONG TERM GOAL #1   Title Ind with HEP    Status New      PT LONG TERM GOAL #2   Title Patient able to perform [facility] ADLS with functional left shoulder ROM of 145 deg or more of flex/scaption.    Time 12    Period Weeks    Status New      PT LONG TERM GOAL #3   Title Improved left shoulder ER to >=60 degrees to normalize ADLs including reaching behind his head.    Status New      PT LONG TERM GOAL #4   Title Pt able to reach behind his back with his left arm without difficulty.    Status New                    Plan - 03/23/21 1014     Clinical Impression Statement Pt arrives today denying any left shoulder pain, but endorses 5/10 low back pain from fall experienced over the weekend.  Pt requiring few cues today with theraband activites as far as technique, but continues to requiring numerous cues for posture.  Session ended a few minutes early due to pt reporting increased lower back pain to 7/10.  Pt once again encouraged to reach out to his PCP about his back pain.  Pt stated "I doubt I will."    Personal Factors and Comorbidities Comorbidity 3+    Comorbidities back surgery Aug 2022, cardiac defibrillator, DM CHF    Stability/Clinical Decision Making Stable/Uncomplicated    Rehab Potential Good    PT Frequency 2x / week    PT Duration 8 weeks    PT Treatment/Interventions ADLs/Self Care Home Management;Cryotherapy;Moist Heat;Therapeutic activities;Therapeutic exercise;Neuromuscular re-education;Manual techniques;Dry needling;Patient/family education;Passive range of motion;Joint Manipulations    PT Next Visit Plan Work on left shoulder ROM, postural strength; check A/C joint mobility; would benefit from DN/manual to left subscap and other RC muscles, UT.    PT Home Exercise Plan Access Code: [email or web address]  URL: [email or web address]  Date: 03/02/2021  Prepared by: Almyra Free     Exercises  Supine Shoulder Flexion AAROM with Dowel - 3 x daily - 7 x weekly - 1-3 sets - 10 reps  Seated Scapular Retraction with External Rotation - 2 x daily - 7 x weekly - 1 sets - 10 reps  Seated Scapular Retraction - 2 x daily - 7 x weekly - 1 sets - 10 reps - 2-3 sec hold    Consulted and Agree with Plan of Care Patient             Patient will benefit from skilled therapeutic intervention in order to improve the following deficits and impairments:  Decreased range of motion, Pain, Increased muscle spasms, Impaired UE functional use, Decreased strength, Postural dysfunction, Impaired  flexibility  Visit Diagnosis: Stiffness of  left shoulder, not elsewhere classified  Chronic left shoulder pain     Problem List Patient Active Problem List   Diagnosis Date Noted   Unilateral primary osteoarthritis, right knee 08/26/2020   S/P fusion of thoracic spine 03/12/2020   S/P cubital tunnel release 09/05/2019   Seborrheic keratoses 07/16/2019   Cubital tunnel syndrome on left 07/03/2019   Numbness and tingling in left hand 06/24/2019   Ulnar neuropathy of left upper extremity 06/24/2019   Absolute anemia 03/05/2018   S/P ICD (internal cardiac defibrillator) procedure 01/17/2018   Type 2 diabetes mellitus ([place]) 12/14/2015   Cardiomyopathy ([place]) 12/14/2015   Hyperlipidemia 12/14/2015   Squamous cell carcinoma of skin of left cheek 10/26/2015   Congestive heart failure ([place]) 10/09/2013   Action tremor 06/04/2013   Personal history of other malignant neoplasm of skin 11/14/2012   Ankylosis of spine 04/20/2011   Fracture of T6 vertebra ([place]) 04/20/2011   Hiatal hernia 09/02/2010   Barrett's esophagus 09/02/2010   Carotid stenosis, bilateral 09/02/2010   Hypertension 08/31/2010   CAD (coronary artery disease) 08/31/2010   PAD (peripheral artery disease) ([place]) 08/31/2010   DDD (degenerative disc disease), lumbar 08/31/2010   Chronic low back pain 08/31/2010   Actinic keratosis 08/31/2010   Melanoma ([place]) 08/31/2010   Anterior circulation transient ischemic attack 05/22/2009   Stroke ([place]) 10/25/2005    Kathrynn Ducking, PTA 03/23/2021, 11:15 AM  [facility] [place], [place], [address] Phone: [phone]   Fax:  [phone]  Name: Edward Heath MRN: [id number] Date of Birth: 1946/01/23

## 2021-03-28 ENCOUNTER — Other Ambulatory Visit: Payer: Self-pay

## 2021-03-28 ENCOUNTER — Ambulatory Visit: Payer: No Typology Code available for payment source

## 2021-03-28 DIAGNOSIS — G8929 Other chronic pain: Secondary | ICD-10-CM

## 2021-03-28 DIAGNOSIS — M25612 Stiffness of left shoulder, not elsewhere classified: Secondary | ICD-10-CM

## 2021-03-28 DIAGNOSIS — M25512 Pain in left shoulder: Secondary | ICD-10-CM

## 2021-03-28 NOTE — Therapy (Signed)
[facility] [place], [place], [address] Phone: [phone]   Fax:  [phone]  Physical Therapy Treatment  Patient Details  Name: Edward Heath MRN: [id number] Date of Birth: Dec 15, 1945 Referring Provider (PT): Alexzandrew Dara Lords PA-C   Encounter Date: 03/28/2021   PT End of Session - 03/28/21 1241     Visit Number 7    Number of Visits 15    Date for PT Re-Evaluation 06/30/21    Authorization Type VA    Authorization Time Period 15  visits approved for 120 days    PT Start Time 74    PT Stop Time 2353    PT Time Calculation (min) 45 min    Activity Tolerance Patient tolerated treatment well    Behavior During Therapy [facility] for tasks assessed/performed             Past Medical History:  Diagnosis Date   AICD (automatic cardioverter/defibrillator) present    Arthritis    CHF (congestive heart failure) ([place])    Coronary artery disease    Diabetes mellitus without complication ([place])    Dysrhythmia    GERD (gastroesophageal reflux disease)    Heart attack ([place]) 2004, 2015   History of kidney stones    Hyperlipidemia    Hypertension    Hypothyroidism    Lumbar vertebral fracture ([place]) 2012   NSTEMI, initial episode of care ([place]) 10/09/2013   Skin cancer 06/2014   left cheek   Stroke ([place])    x3   Thyroid disease    TIA (transient ischemic attack) 2009    Past Surgical History:  Procedure Laterality Date   CARDIAC DEFIBRILLATOR PLACEMENT  12/2003, Repaired in 2012   x3   [place]  06/2014   had 4 removed   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   Procedure: LEFT ELBOW ULNAR NERVE DECOMPRESSION;  Surgeon: [name], MD;  Location: [facility];  Service: Orthopedics;  Laterality: Left;    There were no vitals filed for this visit.   Subjective Assessment - 03/28/21 1241     Subjective COVID screening completed prior to pt arrival for treatment. Pt reports  8/10 low back pain upon arriving for today's treatment session.  Pt denies any shoulder pain.    Pertinent History back surgery Aug 2022, cardiac defibrillator, DM CHF    Diagnostic tests xrays    Patient Stated Goals to move shoulder better with less pain    Currently in Pain? Yes    Pain Score 8     Pain Location Back    Pain Orientation Mid;Lower                               [facility] Adult PT Treatment/Exercise - 03/28/21 0001       Exercises   Exercises Shoulder      Neck Exercises: Machines for Strengthening   UBE (Upper Arm Bike) 8 mins   4 min forward/4 mins backward     Neck Exercises: Theraband   Scapula Retraction 20 reps    Shoulder Extension 20 reps;Red   Cues for posture   Rows 20 reps;Red   Cues for posture   Shoulder External Rotation 20 reps;Red   Cues to keep elbows in   Horizontal ABduction 20 reps;Red   Cues for eccentic control     Neck Exercises: Seated   Shoulder Shrugs 20  reps      Shoulder Exercises: Pulleys   Flexion 3 minutes    ABduction 3 minutes      Shoulder Exercises: ROM/Strengthening   Ranger Seated; flex/ext, left/right, CW/CCW circles      Manual Therapy   Manual Therapy Soft tissue mobilization;Passive ROM    Soft tissue mobilization STW to left deltoid and biscep due to pain and tone    Passive ROM Seated due to back pain                          PT Long Term Goals - 03/02/21 1239       PT LONG TERM GOAL #1   Title Ind with HEP    Status New      PT LONG TERM GOAL #2   Title Patient able to perform [facility] ADLS with functional left shoulder ROM of 145 deg or more of flex/scaption.    Time 12    Period Weeks    Status New      PT LONG TERM GOAL #3   Title Improved left shoulder ER to >=60 degrees to normalize ADLs including reaching behind his head.    Status New      PT LONG TERM GOAL #4   Title Pt able to reach behind his back with his left arm without difficulty.    Status New                    Plan - 03/28/21 1241     Clinical Impression Statement Pt arrived for today's treatment session reporting 8/10 low back pain.  Pt has not gone to the doctor as of yet, despite encouragement.  Pt denies in shoulder pain upon arrivial. Pt does endorse shoulder tightness and soreness during performance of tband exercises.  STW/M performed to left deltoid and bicep to decrease pain and tone.  Upon completion of today's treatment session pt reported 8/10 low back pain and left shoulder soreness.  Pt once again encouraged to go to doctor about his low back pain.    Personal Factors and Comorbidities Comorbidity 3+    Comorbidities back surgery Aug 2022, cardiac defibrillator, DM CHF    Stability/Clinical Decision Making Stable/Uncomplicated    Rehab Potential Good    PT Frequency 2x / week    PT Duration 8 weeks    PT Treatment/Interventions ADLs/Self Care Home Management;Cryotherapy;Moist Heat;Therapeutic activities;Therapeutic exercise;Neuromuscular re-education;Manual techniques;Dry needling;Patient/family education;Passive range of motion;Joint Manipulations    PT Next Visit Plan Work on left shoulder ROM, postural strength; check A/C joint mobility; would benefit from DN/manual to left subscap and other RC muscles, UT.    PT Home Exercise Plan Access Code: [email or web address]  URL: [email or web address]  Date: 03/02/2021  Prepared by: Almyra Free     Exercises  Supine Shoulder Flexion AAROM with Dowel - 3 x daily - 7 x weekly - 1-3 sets - 10 reps  Seated Scapular Retraction with External Rotation - 2 x daily - 7 x weekly - 1 sets - 10 reps  Seated Scapular Retraction - 2 x daily - 7 x weekly - 1 sets - 10 reps - 2-3 sec hold    Consulted and Agree with Plan of Care Patient             Patient will benefit from skilled therapeutic intervention in order to improve the following deficits and impairments:  Decreased range of motion, Pain, Increased muscle spasms, Impaired UE  functional  use, Decreased strength, Postural dysfunction, Impaired flexibility  Visit Diagnosis: Stiffness of left shoulder, not elsewhere classified  Chronic left shoulder pain     Problem List Patient Active Problem List   Diagnosis Date Noted   Unilateral primary osteoarthritis, right knee 08/26/2020   S/P fusion of thoracic spine 03/12/2020   S/P cubital tunnel release 09/05/2019   Seborrheic keratoses 07/16/2019   Cubital tunnel syndrome on left 07/03/2019   Numbness and tingling in left hand 06/24/2019   Ulnar neuropathy of left upper extremity 06/24/2019   Absolute anemia 03/05/2018   S/P ICD (internal cardiac defibrillator) procedure 01/17/2018   Type 2 diabetes mellitus ([place]) 12/14/2015   Cardiomyopathy ([place]) 12/14/2015   Hyperlipidemia 12/14/2015   Squamous cell carcinoma of skin of left cheek 10/26/2015   Congestive heart failure ([place]) 10/09/2013   Action tremor 06/04/2013   Personal history of other malignant neoplasm of skin 11/14/2012   Ankylosis of spine 04/20/2011   Fracture of T6 vertebra ([place]) 04/20/2011   Hiatal hernia 09/02/2010   Barrett's esophagus 09/02/2010   Carotid stenosis, bilateral 09/02/2010   Hypertension 08/31/2010   CAD (coronary artery disease) 08/31/2010   PAD (peripheral artery disease) ([place]) 08/31/2010   DDD (degenerative disc disease), lumbar 08/31/2010   Chronic low back pain 08/31/2010   Actinic keratosis 08/31/2010   Melanoma ([place]) 08/31/2010   Anterior circulation transient ischemic attack 05/22/2009   Stroke ([place]) 10/25/2005    Kathrynn Ducking, PTA 03/28/2021, 1:51 PM  [facility] [place], [place], [address] Phone: [phone]   Fax:  [phone]  Name: LOU LOEWE MRN: [id number] Date of Birth: 09/22/1945

## 2021-03-30 ENCOUNTER — Ambulatory Visit: Payer: No Typology Code available for payment source

## 2021-03-30 ENCOUNTER — Other Ambulatory Visit: Payer: Self-pay

## 2021-03-30 DIAGNOSIS — G8929 Other chronic pain: Secondary | ICD-10-CM

## 2021-03-30 DIAGNOSIS — M25612 Stiffness of left shoulder, not elsewhere classified: Secondary | ICD-10-CM | POA: Diagnosis not present

## 2021-03-30 NOTE — Therapy (Signed)
[facility] [place], [place], [address] Phone: [phone]   Fax:  [phone]  Physical Therapy Treatment  Patient Details  Name: Edward Heath MRN: [id number] Date of Birth: [birth year] Referring Provider (PT): Alexzandrew Dara Lords PA-C   Encounter Date: 03/30/2021   PT End of Session - 03/30/21 1246     Visit Number 8    Number of Visits 15    Date for PT Re-Evaluation 06/30/21    Authorization Type VA    Authorization Time Period 15  visits approved for 120 days    PT Start Time 39    PT Stop Time 1497    PT Time Calculation (min) 45 min    Activity Tolerance Patient tolerated treatment well    Behavior During Therapy [facility] for tasks assessed/performed             Past Medical History:  Diagnosis Date   AICD (automatic cardioverter/defibrillator) present    Arthritis    CHF (congestive heart failure) ([place])    Coronary artery disease    Diabetes mellitus without complication ([place])    Dysrhythmia    GERD (gastroesophageal reflux disease)    Heart attack ([place]) 2004, 2015   History of kidney stones    Hyperlipidemia    Hypertension    Hypothyroidism    Lumbar vertebral fracture ([place]) 2012   NSTEMI, initial episode of care ([place]) 10/09/2013   Skin cancer 06/2014   left cheek   Stroke ([place])    x3   Thyroid disease    TIA (transient ischemic attack) 2009    Past Surgical History:  Procedure Laterality Date   CARDIAC DEFIBRILLATOR PLACEMENT  12/2003, Repaired in 2012   x3   [place]  06/2014   had 4 removed   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   Procedure: LEFT ELBOW ULNAR NERVE DECOMPRESSION;  Surgeon: [name], MD;  Location: [facility];  Service: Orthopedics;  Laterality: Left;    There were no vitals filed for this visit.   Subjective Assessment - 03/30/21 1245     Subjective COVID screening completed prior to pt arrival for treatment.  Upon arrival  to today's treatment session pt reports 6/10 low back pain and denies any shoulder pain.    Pertinent History back surgery Aug 2022, cardiac defibrillator, DM CHF    Diagnostic tests xrays    Patient Stated Goals to move shoulder better with less pain    Currently in Pain? Yes    Pain Score 6     Pain Location Back                               [facility] Adult PT Treatment/Exercise - 03/30/21 0001       Shoulder Exercises: Pulleys   Flexion 5 minutes      Shoulder Exercises: ROM/Strengthening   UBE (Upper Arm Bike) 10 mins   5 min forward/5 mins backward   Ranger Seated; flex/ext, left/right, CW/CCW circles      Manual Therapy   Manual Therapy Soft tissue mobilization;Passive ROM    Soft tissue mobilization STW/M to left bicep to decrease pain and tone    Passive ROM Seated due to back pain, all directions                          PT Long Term  Goals - 03/30/21 1305       PT LONG TERM GOAL #1   Title Ind with HEP    Status On-going      PT LONG TERM GOAL #2   Title Patient able to perform [facility] ADLS with functional left shoulder ROM of 145 deg or more of flex/scaption.    Baseline 03/30/21: Measured seated due to back pain 125 degrees    Time 12    Period Weeks    Status On-going      PT LONG TERM GOAL #3   Title Improved left shoulder ER to >=60 degrees to normalize ADLs including reaching behind his head.    Baseline 03/30/21: measured seated due to back pain, 50 degrees.    Status On-going      PT LONG TERM GOAL #4   Title Pt able to reach behind his back with his left arm without difficulty.    Status On-going                   Plan - 03/30/21 1246     Clinical Impression Statement Pt arrived for today's treatment session reporting 6/10 low back pain, but denies any shoulder pain.  Pt did not go to the doctor earlier this week as encouraged.  Pt able to increase left shoulder flexion to 125 degrees and left shoulder ER to 50  degrees as compared to evaluation.  Pt limited by back pain more so than shoulder at this point in time.  Pt reported 6/10 low back pain at completion of today's treatment session.    Personal Factors and Comorbidities Comorbidity 3+    Comorbidities back surgery Aug 2022, cardiac defibrillator, DM CHF    Stability/Clinical Decision Making Stable/Uncomplicated    Rehab Potential Good    PT Frequency 2x / week    PT Duration 8 weeks    PT Treatment/Interventions ADLs/Self Care Home Management;Cryotherapy;Moist Heat;Therapeutic activities;Therapeutic exercise;Neuromuscular re-education;Manual techniques;Dry needling;Patient/family education;Passive range of motion;Joint Manipulations    PT Next Visit Plan Work on left shoulder ROM, postural strength; check A/C joint mobility; would benefit from DN/manual to left subscap and other RC muscles, UT.    PT Home Exercise Plan Access Code: [email or web address]  URL: [email or web address]  Date: 03/02/2021  Prepared by: Almyra Free     Exercises  Supine Shoulder Flexion AAROM with Dowel - 3 x daily - 7 x weekly - 1-3 sets - 10 reps  Seated Scapular Retraction with External Rotation - 2 x daily - 7 x weekly - 1 sets - 10 reps  Seated Scapular Retraction - 2 x daily - 7 x weekly - 1 sets - 10 reps - 2-3 sec hold    Consulted and Agree with Plan of Care Patient             Patient will benefit from skilled therapeutic intervention in order to improve the following deficits and impairments:  Decreased range of motion, Pain, Increased muscle spasms, Impaired UE functional use, Decreased strength, Postural dysfunction, Impaired flexibility  Visit Diagnosis: Stiffness of left shoulder, not elsewhere classified  Chronic left shoulder pain     Problem List Patient Active Problem List   Diagnosis Date Noted   Unilateral primary osteoarthritis, right knee 08/26/2020   S/P fusion of thoracic spine 03/12/2020   S/P cubital tunnel release 09/05/2019    Seborrheic keratoses 07/16/2019   Cubital tunnel syndrome on left 07/03/2019   Numbness and tingling in left hand 06/24/2019   Ulnar neuropathy of left upper  extremity 06/24/2019   Absolute anemia 03/05/2018   S/P ICD (internal cardiac defibrillator) procedure 01/17/2018   Type 2 diabetes mellitus ([place]) 12/14/2015   Cardiomyopathy ([place]) 12/14/2015   Hyperlipidemia 12/14/2015   Squamous cell carcinoma of skin of left cheek 10/26/2015   Congestive heart failure ([place]) 10/09/2013   Action tremor 06/04/2013   Personal history of other malignant neoplasm of skin 11/14/2012   Ankylosis of spine 04/20/2011   Fracture of T6 vertebra ([place]) 04/20/2011   Hiatal hernia 09/02/2010   Barrett's esophagus 09/02/2010   Carotid stenosis, bilateral 09/02/2010   Hypertension 08/31/2010   CAD (coronary artery disease) 08/31/2010   PAD (peripheral artery disease) ([place]) 08/31/2010   DDD (degenerative disc disease), lumbar 08/31/2010   Chronic low back pain 08/31/2010   Actinic keratosis 08/31/2010   Melanoma ([place]) 08/31/2010   Anterior circulation transient ischemic attack 05/22/2009   Stroke ([place]) 10/25/2005    Kathrynn Ducking, PTA 03/30/2021, 2:23 PM  [facility] [place], [place], [address] Phone: [phone]   Fax:  [phone]  Name: Edward Heath MRN: [id number] Date of Birth: 1945-10-01

## 2021-04-04 ENCOUNTER — Other Ambulatory Visit: Payer: Self-pay

## 2021-04-04 ENCOUNTER — Ambulatory Visit: Payer: No Typology Code available for payment source

## 2021-04-04 DIAGNOSIS — M25612 Stiffness of left shoulder, not elsewhere classified: Secondary | ICD-10-CM | POA: Diagnosis not present

## 2021-04-04 DIAGNOSIS — G8929 Other chronic pain: Secondary | ICD-10-CM

## 2021-04-04 DIAGNOSIS — M25512 Pain in left shoulder: Secondary | ICD-10-CM

## 2021-04-04 NOTE — Therapy (Signed)
[facility] [place], [place], [address] Phone: [phone]   Fax:  [phone]  Physical Therapy Treatment  Patient Details  Name: Edward Heath MRN: [id number] Date of Birth: 1945-11-01 Referring Provider (PT): Alexzandrew Dara Lords PA-C   Encounter Date: 04/04/2021   PT End of Session - 04/04/21 1256     Visit Number 9    Number of Visits 15    Date for PT Re-Evaluation 06/30/21    Authorization Type VA    Authorization Time Period 15  visits approved for 120 days    PT Start Time 1300    PT Stop Time 1344    PT Time Calculation (min) 44 min    Activity Tolerance Patient tolerated treatment well    Behavior During Therapy [facility] for tasks assessed/performed             Past Medical History:  Diagnosis Date   AICD (automatic cardioverter/defibrillator) present    Arthritis    CHF (congestive heart failure) ([place])    Coronary artery disease    Diabetes mellitus without complication ([place])    Dysrhythmia    GERD (gastroesophageal reflux disease)    Heart attack ([place]) 2004, 2015   History of kidney stones    Hyperlipidemia    Hypertension    Hypothyroidism    Lumbar vertebral fracture ([place]) 2012   NSTEMI, initial episode of care ([place]) 10/09/2013   Skin cancer 06/2014   left cheek   Stroke ([place])    x3   Thyroid disease    TIA (transient ischemic attack) 2009    Past Surgical History:  Procedure Laterality Date   CARDIAC DEFIBRILLATOR PLACEMENT  12/2003, Repaired in 2012   x3   [place]  06/2014   had 4 removed   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   Procedure: LEFT ELBOW ULNAR NERVE DECOMPRESSION;  Surgeon: [name], MD;  Location: [facility];  Service: Orthopedics;  Laterality: Left;    There were no vitals filed for this visit.   Subjective Assessment - 04/04/21 1255     Subjective COVID screening completed prior to pt arrival for treatment.  Pt reporting  4/10 low back pain when arriving to today's treatment session and denies any shoulder pain.    Pertinent History back surgery Aug 2022, cardiac defibrillator, DM CHF    Diagnostic tests xrays    Patient Stated Goals to move shoulder better with less pain    Currently in Pain? Yes    Pain Score 4     Pain Location Back                               [facility] Adult PT Treatment/Exercise - 04/04/21 0001       Exercises   Exercises Shoulder      Neck Exercises: Machines for Strengthening   Nustep Lvl 4 x 15 mins      Neck Exercises: Theraband   Scapula Retraction 20 reps    Shoulder Extension 20 reps;Red   Cues for posture   Rows 20 reps;Red   Cues for posture   Shoulder External Rotation 20 reps;Red   Cues to keep elbows at his side   Other Theraband Exercises Shoulder shrugs  x 20 reps      Shoulder Exercises: Pulleys   Flexion 5 minutes      Shoulder Exercises: ROM/Strengthening  Ranger Seated; flex/ext, left/right, CW/CCW circles                          PT Long Term Goals - 03/30/21 1305       PT LONG TERM GOAL #1   Title Ind with HEP    Status On-going      PT LONG TERM GOAL #2   Title Patient able to perform [facility] ADLS with functional left shoulder ROM of 145 deg or more of flex/scaption.    Baseline 03/30/21: Measured seated due to back pain 125 degrees    Time 12    Period Weeks    Status On-going      PT LONG TERM GOAL #3   Title Improved left shoulder ER to >=60 degrees to normalize ADLs including reaching behind his head.    Baseline 03/30/21: measured seated due to back pain, 50 degrees.    Status On-going      PT LONG TERM GOAL #4   Title Pt able to reach behind his back with his left arm without difficulty.    Status On-going                   Plan - 04/04/21 1256     Clinical Impression Statement Pt arrived for today's treatment session reporting 4/10 low back pain and 0/10 shoulder pain.  Pt stated that he  "took the weekend off" and rested and thinks that is why he is feeling better today.  Pt able to tolerate Nustep today for warmup x 15 mins without increase in pain.  Pt requiring increased cues today for technique and pacing than usual.  Pt limited by inability to perform standing exercises due to back pain and balance issues.  Pt reported no change in pain at completion of today's treatment session.    Personal Factors and Comorbidities Comorbidity 3+    Comorbidities back surgery Aug 2022, cardiac defibrillator, DM CHF    Stability/Clinical Decision Making Stable/Uncomplicated    Rehab Potential Good    PT Frequency 2x / week    PT Duration 8 weeks    PT Treatment/Interventions ADLs/Self Care Home Management;Cryotherapy;Moist Heat;Therapeutic activities;Therapeutic exercise;Neuromuscular re-education;Manual techniques;Dry needling;Patient/family education;Passive range of motion;Joint Manipulations    PT Next Visit Plan Work on left shoulder ROM, postural strength; check A/C joint mobility; would benefit from DN/manual to left subscap and other RC muscles, UT.    PT Home Exercise Plan Access Code: [email or web address]  URL: [email or web address]  Date: 03/02/2021  Prepared by: Almyra Free     Exercises  Supine Shoulder Flexion AAROM with Dowel - 3 x daily - 7 x weekly - 1-3 sets - 10 reps  Seated Scapular Retraction with External Rotation - 2 x daily - 7 x weekly - 1 sets - 10 reps  Seated Scapular Retraction - 2 x daily - 7 x weekly - 1 sets - 10 reps - 2-3 sec hold    Consulted and Agree with Plan of Care Patient             Patient will benefit from skilled therapeutic intervention in order to improve the following deficits and impairments:  Decreased range of motion, Pain, Increased muscle spasms, Impaired UE functional use, Decreased strength, Postural dysfunction, Impaired flexibility  Visit Diagnosis: Stiffness of left shoulder, not elsewhere classified  Chronic left shoulder  pain     Problem List Patient Active Problem List   Diagnosis Date Noted   Unilateral primary  osteoarthritis, right knee 08/26/2020   S/P fusion of thoracic spine 03/12/2020   S/P cubital tunnel release 09/05/2019   Seborrheic keratoses 07/16/2019   Cubital tunnel syndrome on left 07/03/2019   Numbness and tingling in left hand 06/24/2019   Ulnar neuropathy of left upper extremity 06/24/2019   Absolute anemia 03/05/2018   S/P ICD (internal cardiac defibrillator) procedure 01/17/2018   Type 2 diabetes mellitus ([place]) 12/14/2015   Cardiomyopathy ([place]) 12/14/2015   Hyperlipidemia 12/14/2015   Squamous cell carcinoma of skin of left cheek 10/26/2015   Congestive heart failure ([place]) 10/09/2013   Action tremor 06/04/2013   Personal history of other malignant neoplasm of skin 11/14/2012   Ankylosis of spine 04/20/2011   Fracture of T6 vertebra ([place]) 04/20/2011   Hiatal hernia 09/02/2010   Barrett's esophagus 09/02/2010   Carotid stenosis, bilateral 09/02/2010   Hypertension 08/31/2010   CAD (coronary artery disease) 08/31/2010   PAD (peripheral artery disease) ([place]) 08/31/2010   DDD (degenerative disc disease), lumbar 08/31/2010   Chronic low back pain 08/31/2010   Actinic keratosis 08/31/2010   Melanoma ([place]) 08/31/2010   Anterior circulation transient ischemic attack 05/22/2009   Stroke ([place]) 10/25/2005    Kathrynn Ducking, PTA 04/04/2021, 1:53 PM  [facility] [place], [place], [address] Phone: [phone]   Fax:  [phone]  Name: Edward Heath MRN: [id number] Date of Birth: 1945-12-28

## 2021-04-06 ENCOUNTER — Ambulatory Visit: Payer: No Typology Code available for payment source

## 2021-04-06 ENCOUNTER — Other Ambulatory Visit: Payer: Self-pay

## 2021-04-06 DIAGNOSIS — G8929 Other chronic pain: Secondary | ICD-10-CM

## 2021-04-06 DIAGNOSIS — M25612 Stiffness of left shoulder, not elsewhere classified: Secondary | ICD-10-CM | POA: Diagnosis not present

## 2021-04-06 NOTE — Therapy (Addendum)
[facility] [place], [place], [address] Phone: [phone]   Fax:  [phone]  Physical Therapy Treatment  Patient Details  Name: Edward Heath MRN: [id number] Date of Birth: 1946-02-01 Referring Provider (PT): Alexzandrew Dara Lords PA-C   Encounter Date: 04/06/2021   PT End of Session - 04/06/21 1251     Visit Number 10    Number of Visits 15    Date for PT Re-Evaluation 06/30/21    Authorization Type VA    Authorization Time Period 15  visits approved for 120 days    PT Start Time 35    PT Stop Time 1343    PT Time Calculation (min) 43 min    Activity Tolerance Patient tolerated treatment well    Behavior During Therapy [facility] for tasks assessed/performed             Past Medical History:  Diagnosis Date   AICD (automatic cardioverter/defibrillator) present    Arthritis    CHF (congestive heart failure) ([place])    Coronary artery disease    Diabetes mellitus without complication ([place])    Dysrhythmia    GERD (gastroesophageal reflux disease)    Heart attack ([place]) 2004, 2015   History of kidney stones    Hyperlipidemia    Hypertension    Hypothyroidism    Lumbar vertebral fracture ([place]) 2012   NSTEMI, initial episode of care ([place]) 10/09/2013   Skin cancer 06/2014   left cheek   Stroke ([place])    x3   Thyroid disease    TIA (transient ischemic attack) 2009    Past Surgical History:  Procedure Laterality Date   CARDIAC DEFIBRILLATOR PLACEMENT  12/2003, Repaired in 2012   x3   [place]  06/2014   had 4 removed   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   Procedure: LEFT ELBOW ULNAR NERVE DECOMPRESSION;  Surgeon: [name], MD;  Location: [facility];  Service: Orthopedics;  Laterality: Left;    There were no vitals filed for this visit.   Subjective Assessment - 04/06/21 1250     Subjective COVID screening completed prior to pt arrival for treatment.  Pt reported  4/10 low back pain and denies any shoulder pain.    Pertinent History back surgery Aug 2022, cardiac defibrillator, DM CHF    Diagnostic tests xrays    Patient Stated Goals to move shoulder better with less pain    Currently in Pain? Yes    Pain Score 4     Pain Location Back                               [facility] Adult PT Treatment/Exercise - 04/06/21 0001       Neck Exercises: Theraband   Scapula Retraction 20 reps    Shoulder Extension 20 reps;Red    Rows 20 reps;Red    Shoulder External Rotation 20 reps;Red    Horizontal ABduction 20 reps;Red      Shoulder Exercises: Pulleys   Flexion 5 minutes      Shoulder Exercises: ROM/Strengthening   Nustep Lvl 4 x 15 mins    Ranger Seated: flex/ext; CW/CCW circles                          PT Long Term Goals - 04/06/21 1304       PT LONG TERM  GOAL #1   Title Ind with HEP    Status On-going      PT LONG TERM GOAL #2   Title Patient able to perform [facility] ADLS with functional left shoulder ROM of 145 deg or more of flex/scaption.    Baseline 03/30/21: Measured seated due to back pain 125 degrees    Time 12    Period Weeks    Status On-going      PT LONG TERM GOAL #3   Title Improved left shoulder ER to >=60 degrees to normalize ADLs including reaching behind his head.    Baseline 03/30/21: measured seated due to back pain, 50 degrees.    Status On-going      PT LONG TERM GOAL #4   Title Pt able to reach behind his back with his left arm without difficulty.    Status On-going                   Plan - 04/06/21 1251     Clinical Impression Statement Pt arrives for today's treatment session reporting 4/10 low back pain and no shoulder pain.  Pt states that he biggest issue that he is having right now is his back and not his shoulder.  Pt requiring rest breaks throughout treatment due to fatigue and decreased endurance.  Pt denied any shoulder pain at completion of treatmetn without change  in low back pain.    Personal Factors and Comorbidities Comorbidity 3+    Comorbidities back surgery Aug 2022, cardiac defibrillator, DM CHF    Stability/Clinical Decision Making Stable/Uncomplicated    Rehab Potential Good    PT Frequency 2x / week    PT Duration 8 weeks    PT Treatment/Interventions ADLs/Self Care Home Management;Cryotherapy;Moist Heat;Therapeutic activities;Therapeutic exercise;Neuromuscular re-education;Manual techniques;Dry needling;Patient/family education;Passive range of motion;Joint Manipulations    PT Next Visit Plan Work on left shoulder ROM, postural strength; check A/C joint mobility; would benefit from DN/manual to left subscap and other RC muscles, UT.    PT Home Exercise Plan Access Code: [email or web address]  URL: [email or web address]  Date: 03/02/2021  Prepared by: Almyra Free     Exercises  Supine Shoulder Flexion AAROM with Dowel - 3 x daily - 7 x weekly - 1-3 sets - 10 reps  Seated Scapular Retraction with External Rotation - 2 x daily - 7 x weekly - 1 sets - 10 reps  Seated Scapular Retraction - 2 x daily - 7 x weekly - 1 sets - 10 reps - 2-3 sec hold    Consulted and Agree with Plan of Care Patient             Patient will benefit from skilled therapeutic intervention in order to improve the following deficits and impairments:  Decreased range of motion, Pain, Increased muscle spasms, Impaired UE functional use, Decreased strength, Postural dysfunction, Impaired flexibility  Visit Diagnosis: Stiffness of left shoulder, not elsewhere classified  Chronic left shoulder pain     Problem List Patient Active Problem List   Diagnosis Date Noted   Unilateral primary osteoarthritis, right knee 08/26/2020   S/P fusion of thoracic spine 03/12/2020   S/P cubital tunnel release 09/05/2019   Seborrheic keratoses 07/16/2019   Cubital tunnel syndrome on left 07/03/2019   Numbness and tingling in left hand 06/24/2019   Ulnar neuropathy of left upper extremity  06/24/2019   Absolute anemia 03/05/2018   S/P ICD (internal cardiac defibrillator) procedure 01/17/2018   Type 2 diabetes mellitus ([place]) 12/14/2015   Cardiomyopathy ([place])  12/14/2015   Hyperlipidemia 12/14/2015   Squamous cell carcinoma of skin of left cheek 10/26/2015   Congestive heart failure ([place]) 10/09/2013   Action tremor 06/04/2013   Personal history of other malignant neoplasm of skin 11/14/2012   Ankylosis of spine 04/20/2011   Fracture of T6 vertebra ([place]) 04/20/2011   Hiatal hernia 09/02/2010   Barrett's esophagus 09/02/2010   Carotid stenosis, bilateral 09/02/2010   Hypertension 08/31/2010   CAD (coronary artery disease) 08/31/2010   PAD (peripheral artery disease) ([place]) 08/31/2010   DDD (degenerative disc disease), lumbar 08/31/2010   Chronic low back pain 08/31/2010   Actinic keratosis 08/31/2010   Melanoma ([place]) 08/31/2010   Anterior circulation transient ischemic attack 05/22/2009   Stroke ([place]) 10/25/2005    Kathrynn Ducking, PTA 04/06/2021, 1:44 PM  [facility] [place], [place], [address] Phone: [phone]   Fax:  [phone]  Name: MCCARTNEY CHUBA MRN: [id number] Date of Birth: 1945-07-07  Progress Note Reporting Period 03/02/21 to 04/06/21  See note below for Objective Data and Assessment of Progress/Goals. Patient goals ongoing at this time.  No left shoulder pain reported today.    Mali Applegate MPT

## 2021-04-18 DIAGNOSIS — L570 Actinic keratosis: Secondary | ICD-10-CM | POA: Diagnosis not present

## 2021-04-18 DIAGNOSIS — Z85828 Personal history of other malignant neoplasm of skin: Secondary | ICD-10-CM | POA: Diagnosis not present

## 2021-04-20 ENCOUNTER — Ambulatory Visit: Payer: No Typology Code available for payment source

## 2021-04-20 ENCOUNTER — Other Ambulatory Visit: Payer: Self-pay

## 2021-04-20 DIAGNOSIS — M25612 Stiffness of left shoulder, not elsewhere classified: Secondary | ICD-10-CM | POA: Diagnosis not present

## 2021-04-20 DIAGNOSIS — G8929 Other chronic pain: Secondary | ICD-10-CM

## 2021-04-20 DIAGNOSIS — M25512 Pain in left shoulder: Secondary | ICD-10-CM

## 2021-04-20 NOTE — Therapy (Addendum)
[facility] [place], [place], [address] Phone: [phone]   Fax:  [phone]  Physical Therapy Treatment  Patient Details  Name: Edward Heath MRN: [id number] Date of Birth: 1945-07-06 Referring Provider (PT): Alexzandrew Dara Lords PA-C   Encounter Date: 04/20/2021   PT End of Session - 04/20/21 1251     Visit Number 11    Number of Visits 15    Date for PT Re-Evaluation 06/30/21    Authorization Type VA    Authorization Time Period 15  visits approved for 120 days    PT Start Time 1300    PT Stop Time 1344    PT Time Calculation (min) 44 min    Activity Tolerance Patient tolerated treatment well    Behavior During Therapy [facility] for tasks assessed/performed             Past Medical History:  Diagnosis Date   AICD (automatic cardioverter/defibrillator) present    Arthritis    CHF (congestive heart failure) ([place])    Coronary artery disease    Diabetes mellitus without complication ([place])    Dysrhythmia    GERD (gastroesophageal reflux disease)    Heart attack ([place]) 2004, 2015   History of kidney stones    Hyperlipidemia    Hypertension    Hypothyroidism    Lumbar vertebral fracture ([place]) 2012   NSTEMI, initial episode of care ([place]) 10/09/2013   Skin cancer 06/2014   left cheek   Stroke ([place])    x3   Thyroid disease    TIA (transient ischemic attack) 2009    Past Surgical History:  Procedure Laterality Date   CARDIAC DEFIBRILLATOR PLACEMENT  12/2003, Repaired in 2012   x3   [place]  06/2014   had 4 removed   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   Procedure: LEFT ELBOW ULNAR NERVE DECOMPRESSION;  Surgeon: [name], MD;  Location: [facility];  Service: Orthopedics;  Laterality: Left;    There were no vitals filed for this visit.   Subjective Assessment - 04/20/21 1251     Subjective COVID screening completed prior to pt arrival for treatment.  Pt arrives  for today's treatment session reporting 5/10 low back pain.    Pertinent History back surgery Aug 2022, cardiac defibrillator, DM CHF    Diagnostic tests xrays    Patient Stated Goals to move shoulder better with less pain    Currently in Pain? Yes    Pain Score 5     Pain Location Back                               [facility] Adult PT Treatment/Exercise - 04/20/21 0001       Exercises   Exercises Shoulder      Neck Exercises: Machines for Strengthening   Nustep Lvl 4 x 15 mins      Neck Exercises: Theraband   Shoulder Extension 20 reps;Red    Rows 20 reps;Red    Shoulder External Rotation 20 reps;Red    Horizontal ABduction 20 reps;Red      Shoulder Exercises: Pulleys   Flexion 5 minutes                          PT Long Term Goals - 04/20/21 1337       PT LONG TERM GOAL #1  Title Ind with HEP    Status Achieved      PT LONG TERM GOAL #2   Title Patient able to perform [facility] ADLS with functional left shoulder ROM of 145 deg or more of flex/scaption.    Baseline 03/30/21: Measured seated due to back pain 125 degrees; 04/20/21 measure in seated due to 127 degrees    Time 12    Period Weeks    Status On-going      PT LONG TERM GOAL #3   Title Improved left shoulder ER to >=60 degrees to normalize ADLs including reaching behind his head.    Baseline 03/30/21: measured seated due to back pain, 50 degrees; 04/20/21 measured seated due to back pain, 53 degrees    Status On-going      PT LONG TERM GOAL #4   Title Pt able to reach behind his back with his left arm without difficulty.    Status Achieved                   Plan - 04/20/21 1251     Clinical Impression Statement Pt arrives for today's treatement session reporting 5/10 low back pain.  Pt demonstrated decreased endurance with skipping last weeks appointment due to Thanksgiving holiday.  Pt states that he feels that his shoulder is "as good as it is going to get" at this  point.  Pt has met all of his goals except his ROM goals at this time.  Pt reported 4/10 low back pain at completion of today's treatment session.  Pt states that he is ready for discharge at this time.    Personal Factors and Comorbidities Comorbidity 3+    Comorbidities back surgery Aug 2022, cardiac defibrillator, DM CHF    Stability/Clinical Decision Making Stable/Uncomplicated    Rehab Potential Good    PT Frequency 2x / week    PT Duration 8 weeks    PT Treatment/Interventions ADLs/Self Care Home Management;Cryotherapy;Moist Heat;Therapeutic activities;Therapeutic exercise;Neuromuscular re-education;Manual techniques;Dry needling;Patient/family education;Passive range of motion;Joint Manipulations    PT Next Visit Plan Work on left shoulder ROM, postural strength; check A/C joint mobility; would benefit from DN/manual to left subscap and other RC muscles, UT.    PT Home Exercise Plan Access Code: [email or web address]  URL: [email or web address]  Date: 03/02/2021  Prepared by: Almyra Free     Exercises  Supine Shoulder Flexion AAROM with Dowel - 3 x daily - 7 x weekly - 1-3 sets - 10 reps  Seated Scapular Retraction with External Rotation - 2 x daily - 7 x weekly - 1 sets - 10 reps  Seated Scapular Retraction - 2 x daily - 7 x weekly - 1 sets - 10 reps - 2-3 sec hold    Consulted and Agree with Plan of Care Patient             Patient will benefit from skilled therapeutic intervention in order to improve the following deficits and impairments:  Decreased range of motion, Pain, Increased muscle spasms, Impaired UE functional use, Decreased strength, Postural dysfunction, Impaired flexibility  Visit Diagnosis: Stiffness of left shoulder, not elsewhere classified  Chronic left shoulder pain     Problem List Patient Active Problem List   Diagnosis Date Noted   Unilateral primary osteoarthritis, right knee 08/26/2020   S/P fusion of thoracic spine 03/12/2020   S/P cubital tunnel release  09/05/2019   Seborrheic keratoses 07/16/2019   Cubital tunnel syndrome on left 07/03/2019   Numbness and tingling in left hand 06/24/2019  Ulnar neuropathy of left upper extremity 06/24/2019   Absolute anemia 03/05/2018   S/P ICD (internal cardiac defibrillator) procedure 01/17/2018   Type 2 diabetes mellitus ([place]) 12/14/2015   Cardiomyopathy ([place]) 12/14/2015   Hyperlipidemia 12/14/2015   Squamous cell carcinoma of skin of left cheek 10/26/2015   Congestive heart failure ([place]) 10/09/2013   Action tremor 06/04/2013   Personal history of other malignant neoplasm of skin 11/14/2012   Ankylosis of spine 04/20/2011   Fracture of T6 vertebra ([place]) 04/20/2011   Hiatal hernia 09/02/2010   Barrett's esophagus 09/02/2010   Carotid stenosis, bilateral 09/02/2010   Hypertension 08/31/2010   CAD (coronary artery disease) 08/31/2010   PAD (peripheral artery disease) ([place]) 08/31/2010   DDD (degenerative disc disease), lumbar 08/31/2010   Chronic low back pain 08/31/2010   Actinic keratosis 08/31/2010   Melanoma ([place]) 08/31/2010   Anterior circulation transient ischemic attack 05/22/2009   Stroke ([place]) 10/25/2005    Kathrynn Ducking, PTA 04/20/2021, 2:01 PM  [facility] [place], [place], [address] Phone: [phone]   Fax:  [phone]  Name: Edward Heath MRN: [id number] Date of Birth: 1945-07-05  PHYSICAL THERAPY DISCHARGE SUMMARY  Visits from Start of Care: 11.  Current functional level related to goals / functional outcomes: See above.   Remaining deficits: See goal section.   Education / Equipment: HEP.   Patient agrees to discharge. Patient goals were partially met. Patient is being discharged due to lack of progress.    Mali Applegate MPT

## 2021-05-03 ENCOUNTER — Encounter: Payer: Self-pay | Admitting: Family Medicine

## 2021-05-03 ENCOUNTER — Ambulatory Visit (INDEPENDENT_AMBULATORY_CARE_PROVIDER_SITE_OTHER): Payer: Medicare Other | Admitting: Family Medicine

## 2021-05-03 ENCOUNTER — Other Ambulatory Visit: Payer: Self-pay

## 2021-05-03 VITALS — BP 107/58 | HR 72 | Temp 96.8°F | Ht 71.0 in | Wt 228.4 lb

## 2021-05-03 DIAGNOSIS — E1159 Type 2 diabetes mellitus with other circulatory complications: Secondary | ICD-10-CM | POA: Diagnosis not present

## 2021-05-03 DIAGNOSIS — I25118 Atherosclerotic heart disease of native coronary artery with other forms of angina pectoris: Secondary | ICD-10-CM | POA: Diagnosis not present

## 2021-05-03 DIAGNOSIS — Z794 Long term (current) use of insulin: Secondary | ICD-10-CM

## 2021-05-03 DIAGNOSIS — R2689 Other abnormalities of gait and mobility: Secondary | ICD-10-CM | POA: Diagnosis not present

## 2021-05-03 DIAGNOSIS — G20 Parkinson's disease: Secondary | ICD-10-CM | POA: Diagnosis not present

## 2021-05-03 NOTE — Progress Notes (Signed)
Subjective:  Patient ID: Edward Heath, male    DOB: 14-Oct-1945  Age: 75 y.o. MRN: [id number]  CC: Referral   HPI TRYCE SURRATT presents for problems with loss of balance.  He would like to have physical therapy.  His recent therapy sessions for his shoulder were helpful.  The therapist told him that he could help with his balance issues as well.  Patient has parkinsonism and has unsteady gait and loss of balance as a result of the condition.  Requesting hydrocodone scrip. Has some residual pain from old fracture.  A1c about 7 at [place] two months ago.  He is followed there for his diabetes.  GAba not working so [place] made switch to Trileptal for neuropathy  Depression screen [facility] 2/9 05/03/2021 11/01/2020 10/08/2020  Decreased Interest 0 0 0  Down, Depressed, Hopeless 0 0 0  PHQ - 2 Score 0 0 0  Altered sleeping - - -  Tired, decreased energy - - -  Change in appetite - - -  Feeling bad or failure about yourself  - - -  Trouble concentrating - - -  Moving slowly or fidgety/restless - - -  Suicidal thoughts - - -  PHQ-9 Score - - -  Difficult doing work/chores - - -    History Dhilan has a past medical history of AICD (automatic cardioverter/defibrillator) present, Arthritis, CHF (congestive heart failure) ([place]), Coronary artery disease, Diabetes mellitus without complication ([place]), Dysrhythmia, GERD (gastroesophageal reflux disease), Heart attack ([place]) (2004, 2015), History of kidney stones, Hyperlipidemia, Hypertension, Hypothyroidism, Lumbar vertebral fracture ([place]) (2012), NSTEMI, initial episode of care [facility]) (10/09/2013), Skin cancer (06/2014), Stroke [facility]), Thyroid disease, and TIA (transient ischemic attack) (2009).   He has a past surgical history that includes Cardiac defibrillator placement (12/2003, Repaired in 2012); Skin cancer excision (06/2014); Knee surgery (Right, 1994); and Ulnar nerve transposition (Left, 09/05/2019).   His family history includes Aneurysm in his  father; Heart attack in his mother; Heart attack (age of onset: 30) in his brother.He reports that he has never smoked. His smokeless tobacco use includes chew. He reports current alcohol use. He reports that he does not use drugs.    ROS Review of Systems  Constitutional:  Negative for fever.  Respiratory:  Negative for shortness of breath.   Cardiovascular:  Negative for chest pain.  Musculoskeletal:  Negative for arthralgias.  Skin:  Negative for rash.  Neurological:  Positive for tremors (worst at the right hand).   Objective:  BP (!) 107/58    Pulse 72    Temp (!) 96.8 F (36 C)    Ht 5\' 11"  (1.803 m)    Wt 228 lb 6.4 oz (103.6 kg)    SpO2 97%    BMI 31.86 kg/m   BP Readings from Last 3 Encounters:  05/03/21 (!) 107/58  11/01/20 (!) 107/58  06/03/20 120/69    Wt Readings from Last 3 Encounters:  05/03/21 228 lb 6.4 oz (103.6 kg)  11/01/20 233 lb 12.8 oz (106.1 kg)  10/08/20 227 lb (103 kg)     Physical Exam Vitals reviewed.  Constitutional:      Appearance: He is well-developed.  HENT:     Head: Normocephalic and atraumatic.     Right Ear: External ear normal.     Left Ear: External ear normal.     Mouth/Throat:     Pharynx: No oropharyngeal exudate or posterior oropharyngeal erythema.  Eyes:     Pupils: Pupils are equal, round, and  reactive to light.  Cardiovascular:     Rate and Rhythm: Normal rate and regular rhythm.     Heart sounds: No murmur heard. Pulmonary:     Effort: No respiratory distress.     Breath sounds: Normal breath sounds.  Musculoskeletal:     Cervical back: Normal range of motion and neck supple.  Neurological:     Mental Status: He is alert and oriented to person, place, and time.      Assessment & Plan:   Dee was seen today for referral.  Diagnoses and all orders for this visit:  Primary parkinsonism [facility]) -     Ambulatory referral to Physical Therapy  Balance problem -     Ambulatory referral to Physical  Therapy  Type 2 diabetes mellitus with other circulatory complication, with long-term current use of insulin ([place])      I have discontinued Samer D. Shoultz's gabapentin. I am also having him maintain his omeprazole, atorvastatin, Magnesium, metFORMIN, nitroGLYCERIN, Cholecalciferol, apixaban, metoprolol succinate, ferrous sulfate, cyanocobalamin, levothyroxine, amiodarone, furosemide, loratadine, insulin glargine, FreeStyle Libre 14 Day Reader, insulin aspart protamine- aspart, NovoLOG FlexPen, sacubitril-valsartan, melatonin, acetaminophen, FreeStyle Libre 14 Day Sensor, and Oxcarbazepine.  Allergies as of 05/03/2021   No Known Allergies      Medication List        Accurate as of May 03, 2021  7:54 PM. If you have any questions, ask your nurse or doctor.          STOP taking these medications    gabapentin 300 MG capsule Commonly known as: NEURONTIN Stopped by: Claretta Fraise, MD       TAKE these medications    acetaminophen 650 MG CR tablet Commonly known as: TYLENOL Take 1,300 mg by mouth every 6 (six) hours as needed for pain.   amiodarone 200 MG tablet Commonly known as: PACERONE Take 200 mg by mouth daily.   apixaban 5 MG Tabs tablet Commonly known as: Eliquis Take 1 tablet (5 mg total) by mouth 2 (two) times daily. 2 BID X 7 days then 1 BID What changed:  when to take this additional instructions   atorvastatin 80 MG tablet Commonly known as: LIPITOR Take 40 mg by mouth daily.   Cholecalciferol 25 MCG (1000 UT) tablet Commonly known as: Vitamin D-1000 Max St Take 1 tablet (1,000 Units total) by mouth 2 (two) times daily.   cyanocobalamin 1000 MCG tablet Take 1,000 mcg by mouth daily.   ferrous sulfate 325 (65 FE) MG tablet Take 325 mg by mouth daily with breakfast.   FreeStyle Libre 14 Day Reader [name] 1 each by Does not apply route daily.   FreeStyle Libre 14 Day Sensor Misc 1 each by Does not apply route every 14 (fourteen) days.    furosemide 40 MG tablet Commonly known as: LASIX Take 1 tablet (40 mg total) by mouth 2 (two) times daily. What changed: when to take this   insulin aspart protamine- aspart (70-30) 100 UNIT/ML injection Commonly known as: NOVOLOG MIX 70/30 Inject 0.2 mLs (20 Units total) into the skin 2 (two) times daily with a meal. Inject 16 units into the skin in the morning and in the evening.   insulin glargine 100 UNIT/ML injection Commonly known as: Lantus Inject 0.55 mLs (55 Units total) into the skin at bedtime. What changed: how much to take   levothyroxine 125 MCG tablet Commonly known as: SYNTHROID Take 125 mcg by mouth daily before breakfast.   loratadine 10 MG tablet Commonly known  as: CLARITIN Take 10 mg by mouth daily.   Magnesium 500 MG Tabs Take 400 mg by mouth daily.   melatonin 3 MG Tabs tablet Take 3 mg by mouth at bedtime.   metFORMIN 1000 MG tablet Commonly known as: GLUCOPHAGE Take 1,000 mg by mouth 2 (two) times daily with a meal.   metoprolol succinate 100 MG 24 hr tablet Commonly known as: TOPROL-XL Take 1 tablet (100 mg total) by mouth daily. Take with or immediately following a meal.   nitroGLYCERIN 0.4 MG SL tablet Commonly known as: NITROSTAT Place 0.4 mg under the tongue every 5 (five) minutes as needed for chest pain.   NovoLOG FlexPen 100 UNIT/ML FlexPen Generic drug: insulin aspart Inject 22 Units into the skin in the morning and at bedtime.   omeprazole 20 MG capsule Commonly known as: PRILOSEC Take 20 mg by mouth 2 (two) times daily.   Oxcarbazepine 300 MG tablet Commonly known as: TRILEPTAL Take 300 mg by mouth 3 (three) times daily.   sacubitril-valsartan 24-26 MG Commonly known as: ENTRESTO Take 1 tablet by mouth 2 (two) times daily.         Follow-up: Return in about 6 months (around 11/01/2021).  Claretta Fraise, M.D.

## 2021-05-18 ENCOUNTER — Telehealth: Payer: Self-pay | Admitting: Family Medicine

## 2021-05-18 ENCOUNTER — Other Ambulatory Visit: Payer: Self-pay

## 2021-05-18 ENCOUNTER — Ambulatory Visit: Payer: Medicare Other | Attending: Orthopedic Surgery

## 2021-05-18 DIAGNOSIS — R262 Difficulty in walking, not elsewhere classified: Secondary | ICD-10-CM | POA: Insufficient documentation

## 2021-05-18 DIAGNOSIS — R2689 Other abnormalities of gait and mobility: Secondary | ICD-10-CM | POA: Insufficient documentation

## 2021-05-18 DIAGNOSIS — Z9181 History of falling: Secondary | ICD-10-CM | POA: Insufficient documentation

## 2021-05-18 DIAGNOSIS — M6281 Muscle weakness (generalized): Secondary | ICD-10-CM | POA: Insufficient documentation

## 2021-05-18 DIAGNOSIS — G20 Parkinson's disease: Secondary | ICD-10-CM | POA: Diagnosis not present

## 2021-05-18 NOTE — Therapy (Signed)
[facility] [place], [place], [address] Phone: [phone]   Fax:  [phone]  Physical Therapy Evaluation  Patient Details  Name: ODEN LINDAMAN MRN: [id number] Date of Birth: 1946/02/04 Referring Provider (PT): Stacks, MD   Encounter Date: 05/18/2021   PT End of Session - 05/18/21 1257     Visit Number 1    Number of Visits 12    Date for PT Re-Evaluation 08/16/21    PT Start Time 1309    PT Stop Time 4944    PT Time Calculation (min) 36 min    Activity Tolerance Patient tolerated treatment well    Behavior During Therapy [facility] for tasks assessed/performed             Past Medical History:  Diagnosis Date   AICD (automatic cardioverter/defibrillator) present    Arthritis    CHF (congestive heart failure) ([place])    Coronary artery disease    Diabetes mellitus without complication ([place])    Dysrhythmia    GERD (gastroesophageal reflux disease)    Heart attack ([place]) 2004, 2015   History of kidney stones    Hyperlipidemia    Hypertension    Hypothyroidism    Lumbar vertebral fracture ([place]) 2012   NSTEMI, initial episode of care ([place]) 10/09/2013   Skin cancer 06/2014   left cheek   Stroke ([place])    x3   Thyroid disease    TIA (transient ischemic attack) 2009    Past Surgical History:  Procedure Laterality Date   CARDIAC DEFIBRILLATOR PLACEMENT  12/2003, Repaired in 2012   x3   [place]  06/2014   had 4 removed   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   Procedure: LEFT ELBOW ULNAR NERVE DECOMPRESSION;  Surgeon: [name], MD;  Location: [place];  Service: Orthopedics;  Laterality: Left;    There were no vitals filed for this visit.    Subjective Assessment - 05/18/21 1255     Subjective Patient reports that his balance has "been off" since his surgery last summer. He notes that he has not been able to keep his balance. He is able to walk around at home  without his walker, but he has to have someting close by to hold onto if needed. He has fallen twice in recent weeks with the most recent being on 05/14/21. He notes that he dropped a bottle into the floor and when he tried to reach down and pick it up he then lost his balance and fell. He notes that he got a few cuts and bruises from this most recent fall. However, he was able to clean them and then bandage them with a bandaid without needing to go to the hospital. He does not feel like his unsteadiness varies during the day.    Pertinent History HTN, CAD, PAD, history of stroke and TIA, diabetes, chronic low back    Limitations Walking;Standing    How long can you walk comfortably? only around his house without an assistive device, but will use a walker for short community distances or a scooter for long distances    Patient Stated Goals improve his balance, play golf by this spring                [facility] PT Assessment - 05/18/21 0001       Assessment   Medical Diagnosis Balance problem    Referring Provider (PT) Stacks, MD  Onset Date/Surgical Date --   last summer   Next MD Visit --   6 months   Prior Therapy Yes, but not for balance      Precautions   Precautions ICD/Pacemaker;Fall      Restrictions   Weight Bearing Restrictions No      Balance Screen   Has the patient fallen in the past 6 months Yes    How many times? 2    Has the patient had a decrease in activity level because of a fear of falling?  Yes    Is the patient reluctant to leave their home because of a fear of falling?  No      Home Environment   Living Environment Private residence    Living Arrangements Spouse/significant other    Type of [place] One level    [place] - 2 wheels;Walker - 4 wheels;Electric scooter      Prior Function   Level of Independence Independent with household mobility with device;Independent with community mobility  with device    Vocation Retired    Environmental health practitioner   Overall Cognitive Status Within Functional Limits for tasks assessed    Attention Focused    Focused Attention Appears intact    Memory Appears intact    Awareness Appears intact    Problem Solving Appears intact      Sensation   Light Touch Impaired by gross assessment   Increased sensitivity to light touch in throughout the right lower extremity   Additional Comments Patient reports numbness and tingling in right leg and foot      ROM / Strength   AROM / PROM / Strength Strength      Strength   Strength Assessment Site Hip;Knee;Ankle    Right/Left Hip Right;Left    Right Hip Flexion 4-/5    Left Hip Flexion 4-/5    Right/Left Knee Right;Left    Right Knee Flexion 4/5    Right Knee Extension 4-/5    Left Knee Flexion 4/5    Left Knee Extension 4/5    Right/Left Ankle Right;Left    Right Ankle Dorsiflexion 4-/5    Left Ankle Dorsiflexion 4/5      Transfers   Transfers Sit to Stand;Stand to Sit    Sit to Stand 5: Supervision;With upper extremity assist;Multiple attempts;With armrests   unable to complete without UE support   Five time sit to stand comments  33 seconds   with UE assistnace   Stand to Sit 5: Supervision;With upper extremity assist;With armrests      Ambulation/Gait   Ambulation/Gait Yes    Ambulation/Gait Assistance 6: Modified independent (Device/Increase time)    Assistive device Rolling walker    Gait Pattern Shuffle    Ambulation Surface Level;Indoor    Gait velocity decreased      Balance   Balance Assessed Yes      Static Standing Balance   Static Standing Balance -  Activities  Romberg - Eyes Opened    Static Standing - Comment/# of Minutes Increased sway with wide BOS and rhomberg stance   30 seconds each                       Objective measurements completed on examination: See above findings.  PT Long Term Goals -  05/18/21 1453       PT LONG TERM GOAL #1   Title Patient will be independent with his HEP    Time 6    Period Weeks    Status New    Target Date 06/29/21      PT LONG TERM GOAL #2   Title Patient will be able to safely reach down to pick up an object from the floor.    Time 6    Period Weeks    Status New    Target Date 06/29/21      PT LONG TERM GOAL #3   Title Patient will be able to improve his 5x sit to stand to at least 23 seconds or better.    Time 6    Period Weeks    Status New    Target Date 06/29/21                    Plan - 05/18/21 1258     Clinical Impression Statement Patient is a 75 year old male presenting to physical therapy with balance deficits and lower extremity weakness along with multiple falls. He is an elevated fall risk as evidenced by his five time sit to stand time in addition to his multiple falls in recent weeks. He exhibitied increased unsteadiness and sway in static stance without upper extremity assistance. Recommend that he continue with his plan of care to address his impairments to maximize his functional mobility.    Personal Factors and Comorbidities Comorbidity 1;Comorbidity 2;Comorbidity 3+;Time since onset of injury/illness/exacerbation    Comorbidities HTN, CAD, PAD, history of stroke and TIA, diabetes, chronic low back    Examination-Activity Limitations Locomotion Level;Transfers;Carry;Squat;Bend;Stand;Lift    Examination-Participation Restrictions Cleaning;Yard Work;Other    Stability/Clinical Decision Making Unstable/Unpredictable    Clinical Decision Making High    Rehab Potential Fair    PT Frequency 2x / week    PT Duration 6 weeks    PT Treatment/Interventions ADLs/Self Care Home Management;Gait training;Stair training;Functional mobility training;Therapeutic activities;Therapeutic exercise;Balance training;Neuromuscular re-education;Patient/family education;Energy conservation    PT Next Visit Plan nustep, lower  extremity strengthening    Consulted and Agree with Plan of Care Patient             Patient will benefit from skilled therapeutic intervention in order to improve the following deficits and impairments:  Abnormal gait, Difficulty walking, Decreased activity tolerance, Decreased balance, Impaired sensation, Decreased strength, Decreased mobility  Visit Diagnosis: Difficulty in walking, not elsewhere classified  History of falling  Muscle weakness (generalized)     Problem List Patient Active Problem List   Diagnosis Date Noted   Unilateral primary osteoarthritis, right knee 08/26/2020   S/P fusion of thoracic spine 03/12/2020   S/P cubital tunnel release 09/05/2019   Seborrheic keratoses 07/16/2019   Cubital tunnel syndrome on left 07/03/2019   Numbness and tingling in left hand 06/24/2019   Ulnar neuropathy of left upper extremity 06/24/2019   Absolute anemia 03/05/2018   S/P ICD (internal cardiac defibrillator) procedure 01/17/2018   Type 2 diabetes mellitus ([place]) 12/14/2015   Cardiomyopathy ([place]) 12/14/2015   Hyperlipidemia 12/14/2015   Squamous cell carcinoma of skin of left cheek 10/26/2015   Congestive heart failure ([place]) 10/09/2013   Action tremor 06/04/2013   Personal history of other malignant neoplasm of skin 11/14/2012   Ankylosis of spine 04/20/2011   Fracture of T6 vertebra ([place]) 04/20/2011   Hiatal hernia 09/02/2010   Barrett's esophagus  09/02/2010   Carotid stenosis, bilateral 09/02/2010   Hypertension 08/31/2010   CAD (coronary artery disease) 08/31/2010   PAD (peripheral artery disease) ([place]) 08/31/2010   DDD (degenerative disc disease), lumbar 08/31/2010   Chronic low back pain 08/31/2010   Actinic keratosis 08/31/2010   Melanoma ([place]) 08/31/2010   Anterior circulation transient ischemic attack 05/22/2009   Stroke ([place]) 10/25/2005    Darlin Coco, PT 05/18/2021, 3:00 PM  [facility] [place], [place], [address] Phone: [phone]   Fax:  [phone]  Name: ALDER MURRI MRN: [id number] Date of Birth: [birth year]

## 2021-05-18 NOTE — Telephone Encounter (Signed)
°  Prescription Request  05/18/2021  Is this a "Controlled Substance" medicine? no  Have you seen your PCP in the last 2 weeks? 05/03/21  If YES, route message to pool  -  If NO, patient needs to be scheduled for appointment.  What is the name of the medication or equipment? Pt needs to change to freestyle libre 3 because they are doing away with Elenor Legato 1  Have you contacted your pharmacy to request a refill? yes   Which pharmacy would you like this sent to? [facility]   Patient notified that their request is being sent to the clinical staff for review and that they should receive a response within 2 business days.

## 2021-05-19 MED ORDER — FREESTYLE LIBRE 2 READER DEVI: 0 refills | Status: DC

## 2021-05-19 MED ORDER — FREESTYLE LIBRE 2 SENSOR MISC: 2 refills | Status: DC

## 2021-05-19 NOTE — Telephone Encounter (Signed)
Informed pt that the [place] 3 requires pt to download an app on his phone to read his BS, which he only has an old flip phone as well as his ins is not currently picking up this Elenor Legato, so the [place] 2 has been been sent to the pharmacy

## 2021-05-25 ENCOUNTER — Ambulatory Visit: Payer: Medicare Other | Attending: Orthopedic Surgery

## 2021-05-25 ENCOUNTER — Other Ambulatory Visit: Payer: Self-pay

## 2021-05-25 DIAGNOSIS — Z9181 History of falling: Secondary | ICD-10-CM

## 2021-05-25 DIAGNOSIS — R262 Difficulty in walking, not elsewhere classified: Secondary | ICD-10-CM | POA: Diagnosis not present

## 2021-05-25 DIAGNOSIS — M6281 Muscle weakness (generalized): Secondary | ICD-10-CM

## 2021-05-25 NOTE — Therapy (Signed)
[facility] [place], [place], [address] Phone: [phone]   Fax:  [phone]  Physical Therapy Treatment  Patient Details  Name: Edward Heath MRN: [id number] Date of Birth: Jun 09, 1945 Referring Provider (PT): Stacks, MD   Encounter Date: 05/25/2021   PT End of Session - 05/25/21 1432     Number of Visits 12    Date for PT Re-Evaluation 08/16/21    Activity Tolerance Patient tolerated treatment well    Behavior During Therapy [facility] for tasks assessed/performed             Past Medical History:  Diagnosis Date   AICD (automatic cardioverter/defibrillator) present    Arthritis    CHF (congestive heart failure) ([place])    Coronary artery disease    Diabetes mellitus without complication ([place])    Dysrhythmia    GERD (gastroesophageal reflux disease)    Heart attack ([place]) 2004, 2015   History of kidney stones    Hyperlipidemia    Hypertension    Hypothyroidism    Lumbar vertebral fracture ([place]) 2012   NSTEMI, initial episode of care ([place]) 10/09/2013   Skin cancer 06/2014   left cheek   Stroke ([place])    x3   Thyroid disease    TIA (transient ischemic attack) 2009    Past Surgical History:  Procedure Laterality Date   CARDIAC DEFIBRILLATOR PLACEMENT  12/2003, Repaired in 2012   x3   [place]  06/2014   had 4 removed   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   Procedure: LEFT ELBOW ULNAR NERVE DECOMPRESSION;  Surgeon: [name], MD;  Location: [facility];  Service: Orthopedics;  Laterality: Left;    There were no vitals filed for this visit.   Subjective Assessment - 05/25/21 1253     Subjective Pt arrives for today's treatment session reporting 5/10 low back and RLE pain.  Pt states that his shoulders are feeling good.    Pertinent History HTN, CAD, PAD, history of stroke and TIA, diabetes, chronic low back    Limitations Walking;Standing    How long can you walk  comfortably? only around his house without an assistive device, but will use a walker for short community distances or a scooter for long distances    Patient Stated Goals improve his balance, play golf by this spring    Currently in Pain? Yes    Pain Score 5     Pain Location Back    Pain Orientation Left                               [facility] Adult PT Treatment/Exercise - 05/25/21 0001       Exercises   Exercises Knee/Hip      Knee/Hip Exercises: Aerobic   Nustep Lvl 3 x 15 mins      Knee/Hip Exercises: Standing   Heel Raises Both;20 reps   BUE support   Heel Raises Limitations Toe Raises x 20 reps, BUE support    Forward Step Up Both;20 reps;Hand Hold: 2;Step Height: 4"    Forward Step Up Limitations Cues to avoid pulling with BUEs      Knee/Hip Exercises: Seated   Long Arc Quad Strengthening;Both;20 reps;Weights    Long Arc Quad Weight 2 lbs.    Ball Squeeze 20 reps x 3 sec hold    Clamshell with TheraBand Red   20  reps x 3 sec hold   Marching Strengthening;Both;20 reps;Weights    Marching Weights 2 lbs.    Hamstring Curl Strengthening;Both;20 reps    Hamstring Limitations Red tband    Sit to Sand 5 reps;without UE support   CGA for safety                         PT Long Term Goals - 05/18/21 1453       PT LONG TERM GOAL #1   Title Patient will be independent with his HEP    Time 6    Period Weeks    Status New    Target Date 06/29/21      PT LONG TERM GOAL #2   Title Patient will be able to safely reach down to pick up an object from the floor.    Time 6    Period Weeks    Status New    Target Date 06/29/21      PT LONG TERM GOAL #3   Title Patient will be able to improve his 5x sit to stand to at least 23 seconds or better.    Time 6    Period Weeks    Status New    Target Date 06/29/21                   Plan - 05/25/21 1253     Clinical Impression Statement Pt arrives for today's treatment session  reporting 5/10 low back and right lower extremity pain.  Pt instructed in various standing and seated BLE exercises to increase strength and function.  Pt requiring cues for proper technique and posture with various exercises.  Pt reported slight right knee pain with steps ups and LAQs.  Pt reports tingling, "bee stings" from right groin down to right foot that occur "24/7."  Pt reported 4/10 low back pain and RLE pain at completion of today's treatment session.    Personal Factors and Comorbidities Comorbidity 1;Comorbidity 2;Comorbidity 3+;Time since onset of injury/illness/exacerbation    Comorbidities HTN, CAD, PAD, history of stroke and TIA, diabetes, chronic low back    Examination-Activity Limitations Locomotion Level;Transfers;Carry;Squat;Bend;Stand;Lift    Examination-Participation Restrictions Cleaning;Yard Work;Other    Stability/Clinical Decision Making Unstable/Unpredictable    Rehab Potential Fair    PT Frequency 2x / week    PT Duration 6 weeks    PT Treatment/Interventions ADLs/Self Care Home Management;Gait training;Stair training;Functional mobility training;Therapeutic activities;Therapeutic exercise;Balance training;Neuromuscular re-education;Patient/family education;Energy conservation    PT Next Visit Plan nustep, lower extremity strengthening    Consulted and Agree with Plan of Care Patient             Patient will benefit from skilled therapeutic intervention in order to improve the following deficits and impairments:  Abnormal gait, Difficulty walking, Decreased activity tolerance, Decreased balance, Impaired sensation, Decreased strength, Decreased mobility  Visit Diagnosis: Difficulty in walking, not elsewhere classified  History of falling  Muscle weakness (generalized)     Problem List Patient Active Problem List   Diagnosis Date Noted   Unilateral primary osteoarthritis, right knee 08/26/2020   S/P fusion of thoracic spine 03/12/2020   S/P cubital  tunnel release 09/05/2019   Seborrheic keratoses 07/16/2019   Cubital tunnel syndrome on left 07/03/2019   Numbness and tingling in left hand 06/24/2019   Ulnar neuropathy of left upper extremity 06/24/2019   Absolute anemia 03/05/2018   S/P ICD (internal cardiac defibrillator) procedure 01/17/2018   Type 2  diabetes mellitus ([place]) 12/14/2015   Cardiomyopathy ([place]) 12/14/2015   Hyperlipidemia 12/14/2015   Squamous cell carcinoma of skin of left cheek 10/26/2015   Congestive heart failure ([place]) 10/09/2013   Action tremor 06/04/2013   Personal history of other malignant neoplasm of skin 11/14/2012   Ankylosis of spine 04/20/2011   Fracture of T6 vertebra ([place]) 04/20/2011   Hiatal hernia 09/02/2010   Barrett's esophagus 09/02/2010   Carotid stenosis, bilateral 09/02/2010   Hypertension 08/31/2010   CAD (coronary artery disease) 08/31/2010   PAD (peripheral artery disease) ([place]) 08/31/2010   DDD (degenerative disc disease), lumbar 08/31/2010   Chronic low back pain 08/31/2010   Actinic keratosis 08/31/2010   Melanoma ([place]) 08/31/2010   Anterior circulation transient ischemic attack 05/22/2009   Stroke ([place]) 10/25/2005    Kathrynn Ducking, PTA 05/25/2021, 2:43 PM  [facility] [place], [place], [address] Phone: [phone]   Fax:  [phone]  Name: Edward Heath MRN: [id number] Date of Birth: 05/01/1946

## 2021-05-30 ENCOUNTER — Other Ambulatory Visit: Payer: Self-pay

## 2021-05-30 ENCOUNTER — Ambulatory Visit: Payer: Medicare Other

## 2021-05-30 DIAGNOSIS — M6281 Muscle weakness (generalized): Secondary | ICD-10-CM

## 2021-05-30 DIAGNOSIS — Z9181 History of falling: Secondary | ICD-10-CM | POA: Diagnosis not present

## 2021-05-30 DIAGNOSIS — R262 Difficulty in walking, not elsewhere classified: Secondary | ICD-10-CM

## 2021-05-30 NOTE — Therapy (Signed)
[facility] [place], [place], [address] Phone: [phone]   Fax:  [phone]  Physical Therapy Treatment  Patient Details  Name: Edward Heath MRN: [id number] Date of Birth: 1945/10/11 Referring Provider (PT): Stacks, MD   Encounter Date: 05/30/2021   PT End of Session - 05/30/21 1246     Visit Number 3    Number of Visits 12    Date for PT Re-Evaluation 08/16/21    PT Start Time 1300    PT Stop Time 1345    PT Time Calculation (min) 45 min    Activity Tolerance Patient tolerated treatment well    Behavior During Therapy [facility] for tasks assessed/performed             Past Medical History:  Diagnosis Date   AICD (automatic cardioverter/defibrillator) present    Arthritis    CHF (congestive heart failure) ([place])    Coronary artery disease    Diabetes mellitus without complication ([place])    Dysrhythmia    GERD (gastroesophageal reflux disease)    Heart attack ([place]) 2004, 2015   History of kidney stones    Hyperlipidemia    Hypertension    Hypothyroidism    Lumbar vertebral fracture ([place]) 2012   NSTEMI, initial episode of care ([place]) 10/09/2013   Skin cancer 06/2014   left cheek   Stroke ([place])    x3   Thyroid disease    TIA (transient ischemic attack) 2009    Past Surgical History:  Procedure Laterality Date   CARDIAC DEFIBRILLATOR PLACEMENT  12/2003, Repaired in 2012   x3   [place]  06/2014   had 4 removed   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   Procedure: LEFT ELBOW ULNAR NERVE DECOMPRESSION;  Surgeon: [name], MD;  Location: [facility];  Service: Orthopedics;  Laterality: Left;    There were no vitals filed for this visit.   Subjective Assessment - 05/30/21 1246     Subjective Pt arrives for today's treatment session reporting reporting 5/10 low back and right lower extremity pain.  Pt states that his right leg hurts from his groin all the way down to  his right foot.    Pertinent History HTN, CAD, PAD, history of stroke and TIA, diabetes, chronic low back    Limitations Walking;Standing    How long can you walk comfortably? only around his house without an assistive device, but will use a walker for short community distances or a scooter for long distances    Patient Stated Goals improve his balance, play golf by this spring    Currently in Pain? Yes    Pain Score 5     Pain Location Back    Pain Orientation Right                               [facility] Adult PT Treatment/Exercise - 05/30/21 0001       Knee/Hip Exercises: Aerobic   Nustep Lvl 4 x 15 mins      Knee/Hip Exercises: Standing   Forward Step Up Both;Hand Hold: 2;Step Height: 4";10 reps   discontinued due to BLEs buckling   Forward Step Up Limitations Cues to avoid pulling with BUEs      Knee/Hip Exercises: Seated   Long Arc Quad Strengthening;Both;20 reps;Weights    Long Arc Quad Weight 2 lbs.    Ball Squeeze 20  reps x 3 sec hold    Clamshell with TheraBand Red   20 reps x 3 sec hold   Other Seated Knee/Hip Exercises Heel Raises/Toe Raises 2# x 20 reps    Marching Strengthening;Both;20 reps;Weights    Marching Weights 2 lbs.    Hamstring Curl Strengthening;Both;20 reps    Hamstring Limitations Red tband                          PT Long Term Goals - 05/18/21 1453       PT LONG TERM GOAL #1   Title Patient will be independent with his HEP    Time 6    Period Weeks    Status New    Target Date 06/29/21      PT LONG TERM GOAL #2   Title Patient will be able to safely reach down to pick up an object from the floor.    Time 6    Period Weeks    Status New    Target Date 06/29/21      PT LONG TERM GOAL #3   Title Patient will be able to improve his 5x sit to stand to at least 23 seconds or better.    Time 6    Period Weeks    Status New    Target Date 06/29/21                   Plan - 05/30/21 1247      Clinical Impression Statement Pt arrives for todya's treatment session reporting 5/10 low back and right lower extremity pain.  During forward steps pt's R knee buckled once and then when pt attempted to continue step ups, L knee buckled, resulting in step ups being discontinued for the day for safety reasons.  Pt requiring min cues for posture and pacing with all seated LE exercises.  Pt reported 4/10 low back and RLE pain at completion of today's treatment session.    Personal Factors and Comorbidities Comorbidity 1;Comorbidity 2;Comorbidity 3+;Time since onset of injury/illness/exacerbation    Comorbidities HTN, CAD, PAD, history of stroke and TIA, diabetes, chronic low back    Examination-Activity Limitations Locomotion Level;Transfers;Carry;Squat;Bend;Stand;Lift    Examination-Participation Restrictions Cleaning;Yard Work;Other    Stability/Clinical Decision Making Unstable/Unpredictable    Rehab Potential Fair    PT Frequency 2x / week    PT Duration 6 weeks    PT Treatment/Interventions ADLs/Self Care Home Management;Gait training;Stair training;Functional mobility training;Therapeutic activities;Therapeutic exercise;Balance training;Neuromuscular re-education;Patient/family education;Energy conservation    PT Next Visit Plan nustep, lower extremity strengthening    Consulted and Agree with Plan of Care Patient             Patient will benefit from skilled therapeutic intervention in order to improve the following deficits and impairments:  Abnormal gait, Difficulty walking, Decreased activity tolerance, Decreased balance, Impaired sensation, Decreased strength, Decreased mobility  Visit Diagnosis: Difficulty in walking, not elsewhere classified  History of falling  Muscle weakness (generalized)     Problem List Patient Active Problem List   Diagnosis Date Noted   Unilateral primary osteoarthritis, right knee 08/26/2020   S/P fusion of thoracic spine 03/12/2020   S/P  cubital tunnel release 09/05/2019   Seborrheic keratoses 07/16/2019   Cubital tunnel syndrome on left 07/03/2019   Numbness and tingling in left hand 06/24/2019   Ulnar neuropathy of left upper extremity 06/24/2019   Absolute anemia 03/05/2018   S/P ICD (internal cardiac defibrillator)  procedure 01/17/2018   Type 2 diabetes mellitus ([place]) 12/14/2015   Cardiomyopathy ([place]) 12/14/2015   Hyperlipidemia 12/14/2015   Squamous cell carcinoma of skin of left cheek 10/26/2015   Congestive heart failure ([place]) 10/09/2013   Action tremor 06/04/2013   Personal history of other malignant neoplasm of skin 11/14/2012   Ankylosis of spine 04/20/2011   Fracture of T6 vertebra ([place]) 04/20/2011   Hiatal hernia 09/02/2010   Barrett's esophagus 09/02/2010   Carotid stenosis, bilateral 09/02/2010   Hypertension 08/31/2010   CAD (coronary artery disease) 08/31/2010   PAD (peripheral artery disease) ([place]) 08/31/2010   DDD (degenerative disc disease), lumbar 08/31/2010   Chronic low back pain 08/31/2010   Actinic keratosis 08/31/2010   Melanoma ([place]) 08/31/2010   Anterior circulation transient ischemic attack 05/22/2009   Stroke ([place]) 10/25/2005    Kathrynn Ducking, PTA 05/30/2021, 1:53 PM  [facility] [place], [place], [address] Phone: [phone]   Fax:  [phone]  Name: DRAYLON MERCADEL MRN: [id number] Date of Birth: 31-Mar-1946

## 2021-06-01 ENCOUNTER — Ambulatory Visit: Payer: Medicare Other

## 2021-06-01 ENCOUNTER — Other Ambulatory Visit: Payer: Self-pay

## 2021-06-01 DIAGNOSIS — R262 Difficulty in walking, not elsewhere classified: Secondary | ICD-10-CM | POA: Diagnosis not present

## 2021-06-01 DIAGNOSIS — Z9181 History of falling: Secondary | ICD-10-CM

## 2021-06-01 DIAGNOSIS — M6281 Muscle weakness (generalized): Secondary | ICD-10-CM

## 2021-06-01 NOTE — Therapy (Signed)
[facility] [place], [place], [address] Phone: [phone]   Fax:  [phone]  Physical Therapy Treatment  Patient Details  Name: Edward Heath MRN: [id number] Date of Birth: Mar 17, 1946 Referring Provider (PT): Stacks, MD   Encounter Date: 06/01/2021   PT End of Session - 06/01/21 1355     Visit Number 4    Number of Visits 12    Date for PT Re-Evaluation 08/16/21    PT Start Time 6270    PT Stop Time 1425    PT Time Calculation (min) 40 min    Activity Tolerance Patient tolerated treatment well    Behavior During Therapy [facility] for tasks assessed/performed             Past Medical History:  Diagnosis Date   AICD (automatic cardioverter/defibrillator) present    Arthritis    CHF (congestive heart failure) ([place])    Coronary artery disease    Diabetes mellitus without complication ([place])    Dysrhythmia    GERD (gastroesophageal reflux disease)    Heart attack ([place]) 2004, 2015   History of kidney stones    Hyperlipidemia    Hypertension    Hypothyroidism    Lumbar vertebral fracture ([place]) 2012   NSTEMI, initial episode of care ([place]) 10/09/2013   Skin cancer 06/2014   left cheek   Stroke ([place])    x3   Thyroid disease    TIA (transient ischemic attack) 2009    Past Surgical History:  Procedure Laterality Date   CARDIAC DEFIBRILLATOR PLACEMENT  12/2003, Repaired in 2012   x3   [place]  06/2014   had 4 removed   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   Procedure: LEFT ELBOW ULNAR NERVE DECOMPRESSION;  Surgeon: [name], MD;  Location: [facility];  Service: Orthopedics;  Laterality: Left;    There were no vitals filed for this visit.   Subjective Assessment - 06/01/21 1354     Subjective Pt arrives for today's treatment session reporting reporting 5/10 low back and right lower extremity pain.  Pt states that his right leg hurts from his groin all the way down to  his right foot.  Pt is frustrated that his pain is not changing.    Pertinent History HTN, CAD, PAD, history of stroke and TIA, diabetes, chronic low back    Limitations Walking;Standing    How long can you walk comfortably? only around his house without an assistive device, but will use a walker for short community distances or a scooter for long distances    Diagnostic tests xrays    Patient Stated Goals improve his balance, play golf by this spring    Currently in Pain? Yes    Pain Score 5     Pain Location Back    Pain Orientation Right    Pain Onset More than a month ago                               [facility] Adult PT Treatment/Exercise - 06/01/21 0001       Neck Exercises: Machines for Strengthening   Nustep Lvl 4 x 15 mins      Knee/Hip Exercises: Seated   Long Arc Quad Strengthening;Both;Weights    Long Arc Quad Weight 2 lbs.    Long [employer] Limitations 2 mins    [employer] 20 reps x  3 sec hold    Clamshell with TheraBand Red   20 reps x 3 sec   Other Seated Knee/Hip Exercises Heel Raises/Toe Raises 2# x 20 reps    Marching Strengthening;Both;Weights    Marching Limitations 2 mins    Marching Weights 2 lbs.    Hamstring Curl Strengthening;Both;20 reps    Hamstring Limitations Red tband                          PT Long Term Goals - 05/18/21 1453       PT LONG TERM GOAL #1   Title Patient will be independent with his HEP    Time 6    Period Weeks    Status New    Target Date 06/29/21      PT LONG TERM GOAL #2   Title Patient will be able to safely reach down to pick up an object from the floor.    Time 6    Period Weeks    Status New    Target Date 06/29/21      PT LONG TERM GOAL #3   Title Patient will be able to improve his 5x sit to stand to at least 23 seconds or better.    Time 6    Period Weeks    Status New    Target Date 06/29/21                   Plan - 06/01/21 1355     Clinical Impression  Statement Pt arrives for today's treatment session reporting 5/10 low back and right lower extremity pain.  Pt reported increased fatigue during today's session due to doctor's appointments throughout the morning.  Due to this session focused on seated BLE exercises.  Pt requiring min cues for pacing thorughout exercises as he tends to rush through them.  Pt reported 5/10 low back and right lower extremity pain at completion of today's treatment session.    Personal Factors and Comorbidities Comorbidity 1;Comorbidity 2;Comorbidity 3+;Time since onset of injury/illness/exacerbation    Comorbidities HTN, CAD, PAD, history of stroke and TIA, diabetes, chronic low back    Examination-Activity Limitations Locomotion Level;Transfers;Carry;Squat;Bend;Stand;Lift    Examination-Participation Restrictions Cleaning;Yard Work;Other    Stability/Clinical Decision Making Unstable/Unpredictable    Rehab Potential Fair    PT Frequency 2x / week    PT Duration 6 weeks    PT Treatment/Interventions ADLs/Self Care Home Management;Gait training;Stair training;Functional mobility training;Therapeutic activities;Therapeutic exercise;Balance training;Neuromuscular re-education;Patient/family education;Energy conservation    PT Next Visit Plan nustep, lower extremity strengthening    Consulted and Agree with Plan of Care Patient             Patient will benefit from skilled therapeutic intervention in order to improve the following deficits and impairments:  Abnormal gait, Difficulty walking, Decreased activity tolerance, Decreased balance, Impaired sensation, Decreased strength, Decreased mobility  Visit Diagnosis: Difficulty in walking, not elsewhere classified  History of falling  Muscle weakness (generalized)     Problem List Patient Active Problem List   Diagnosis Date Noted   Unilateral primary osteoarthritis, right knee 08/26/2020   S/P fusion of thoracic spine 03/12/2020   S/P cubital tunnel  release 09/05/2019   Seborrheic keratoses 07/16/2019   Cubital tunnel syndrome on left 07/03/2019   Numbness and tingling in left hand 06/24/2019   Ulnar neuropathy of left upper extremity 06/24/2019   Absolute anemia 03/05/2018   S/P ICD (internal cardiac defibrillator) procedure  01/17/2018   Type 2 diabetes mellitus ([place]) 12/14/2015   Cardiomyopathy ([place]) 12/14/2015   Hyperlipidemia 12/14/2015   Squamous cell carcinoma of skin of left cheek 10/26/2015   Congestive heart failure ([place]) 10/09/2013   Action tremor 06/04/2013   Personal history of other malignant neoplasm of skin 11/14/2012   Ankylosis of spine 04/20/2011   Fracture of T6 vertebra ([place]) 04/20/2011   Hiatal hernia 09/02/2010   Barrett's esophagus 09/02/2010   Carotid stenosis, bilateral 09/02/2010   Hypertension 08/31/2010   CAD (coronary artery disease) 08/31/2010   PAD (peripheral artery disease) ([place]) 08/31/2010   DDD (degenerative disc disease), lumbar 08/31/2010   Chronic low back pain 08/31/2010   Actinic keratosis 08/31/2010   Melanoma ([place]) 08/31/2010   Anterior circulation transient ischemic attack 05/22/2009   Stroke ([place]) 10/25/2005    Kathrynn Ducking, PTA 06/01/2021, 2:32 PM  [facility] [place], [place], [address] Phone: [phone]   Fax:  [phone]  Name: JUANA MONTINI MRN: [id number] Date of Birth: [birth year]

## 2021-06-06 ENCOUNTER — Other Ambulatory Visit: Payer: Self-pay

## 2021-06-06 ENCOUNTER — Ambulatory Visit: Payer: Medicare Other

## 2021-06-06 DIAGNOSIS — M6281 Muscle weakness (generalized): Secondary | ICD-10-CM | POA: Diagnosis not present

## 2021-06-06 DIAGNOSIS — Z9181 History of falling: Secondary | ICD-10-CM | POA: Diagnosis not present

## 2021-06-06 DIAGNOSIS — R262 Difficulty in walking, not elsewhere classified: Secondary | ICD-10-CM

## 2021-06-06 NOTE — Therapy (Signed)
[facility] [place], [place], [address] Phone: [phone]   Fax:  [phone]  Physical Therapy Treatment  Patient Details  Name: Edward Heath MRN: [id number] Date of Birth: 03-13-1946 Referring Provider (PT): Stacks, MD   Encounter Date: 06/06/2021   PT End of Session - 06/06/21 1305     Visit Number 5    Number of Visits 12    Date for PT Re-Evaluation 08/16/21    PT Start Time 1300    PT Stop Time 1345    PT Time Calculation (min) 45 min    Activity Tolerance Patient tolerated treatment well    Behavior During Therapy [facility] for tasks assessed/performed             Past Medical History:  Diagnosis Date   AICD (automatic cardioverter/defibrillator) present    Arthritis    CHF (congestive heart failure) ([place])    Coronary artery disease    Diabetes mellitus without complication ([place])    Dysrhythmia    GERD (gastroesophageal reflux disease)    Heart attack ([place]) 2004, 2015   History of kidney stones    Hyperlipidemia    Hypertension    Hypothyroidism    Lumbar vertebral fracture ([place]) 2012   NSTEMI, initial episode of care ([place]) 10/09/2013   Skin cancer 06/2014   left cheek   Stroke ([place])    x3   Thyroid disease    TIA (transient ischemic attack) 2009    Past Surgical History:  Procedure Laterality Date   CARDIAC DEFIBRILLATOR PLACEMENT  12/2003, Repaired in 2012   x3   [place]  06/2014   had 4 removed   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   Procedure: LEFT ELBOW ULNAR NERVE DECOMPRESSION;  Surgeon: [name], MD;  Location: [facility];  Service: Orthopedics;  Laterality: Left;    There were no vitals filed for this visit.   Subjective Assessment - 06/06/21 1303     Subjective Pt arrives for today's treatment session reporting reporting 5/10 low back and right knee pain.  Pt states that his right knee kept him up last night.    Pertinent History HTN,  CAD, PAD, history of stroke and TIA, diabetes, chronic low back    Limitations Walking;Standing    How long can you walk comfortably? only around his house without an assistive device, but will use a walker for short community distances or a scooter for long distances    Diagnostic tests xrays    Patient Stated Goals improve his balance, play golf by this spring    Currently in Pain? Yes    Pain Score 5     Pain Location Knee    Pain Orientation Right    Pain Onset More than a month ago                               [facility] Adult PT Treatment/Exercise - 06/06/21 0001       Knee/Hip Exercises: Aerobic   Nustep Lvl 4 x 15 mins      Knee/Hip Exercises: Seated   Long Arc Quad Strengthening;Both;Weights    Long Arc Quad Weight 3 lbs.    Long Arc Quad Limitations 2 mins    Ball Squeeze 2 mins    Clamshell with TheraBand Red   2 mins   Other Seated Knee/Hip Exercises Heel Raises/Toe Raises 3#  x 20 reps    Marching Strengthening;Both;Weights    Marching Limitations 2 mins    Marching Weights 3 lbs.    Hamstring Curl Strengthening;Both;20 reps    Hamstring Limitations Red tband                          PT Long Term Goals - 06/06/21 1305       PT LONG TERM GOAL #1   Title Patient will be independent with his HEP    Time 6    Period Weeks    Status On-going    Target Date 06/29/21      PT LONG TERM GOAL #2   Title Patient will be able to safely reach down to pick up an object from the floor.    Time 6    Period Weeks    Status On-going    Target Date 06/29/21      PT LONG TERM GOAL #3   Title Patient will be able to improve his 5x sit to stand to at least 23 seconds or better.    Time 6    Period Weeks    Status On-going    Target Date 06/29/21                   Plan - 06/06/21 1305     Clinical Impression Statement Pt arrives for today's treatment session reporting 5/10 low back and right knee pain.  Pt states that he had  increased right knee pain when he woke up this morning, but could not recall any trauma.  Pt requiring min cues for proper technique and posture with various seated exercises.  Pt reported increased fatigue at completion of today's treatment session.    Personal Factors and Comorbidities Comorbidity 1;Comorbidity 2;Comorbidity 3+;Time since onset of injury/illness/exacerbation    Comorbidities HTN, CAD, PAD, history of stroke and TIA, diabetes, chronic low back    Examination-Activity Limitations Locomotion Level;Transfers;Carry;Squat;Bend;Stand;Lift    Examination-Participation Restrictions Cleaning;Yard Work;Other    Stability/Clinical Decision Making Unstable/Unpredictable    Rehab Potential Fair    PT Frequency 2x / week    PT Duration 6 weeks    PT Treatment/Interventions ADLs/Self Care Home Management;Gait training;Stair training;Functional mobility training;Therapeutic activities;Therapeutic exercise;Balance training;Neuromuscular re-education;Patient/family education;Energy conservation    PT Next Visit Plan nustep, lower extremity strengthening    Consulted and Agree with Plan of Care Patient             Patient will benefit from skilled therapeutic intervention in order to improve the following deficits and impairments:  Abnormal gait, Difficulty walking, Decreased activity tolerance, Decreased balance, Impaired sensation, Decreased strength, Decreased mobility  Visit Diagnosis: Difficulty in walking, not elsewhere classified  History of falling  Muscle weakness (generalized)     Problem List Patient Active Problem List   Diagnosis Date Noted   Unilateral primary osteoarthritis, right knee 08/26/2020   S/P fusion of thoracic spine 03/12/2020   S/P cubital tunnel release 09/05/2019   Seborrheic keratoses 07/16/2019   Cubital tunnel syndrome on left 07/03/2019   Numbness and tingling in left hand 06/24/2019   Ulnar neuropathy of left upper extremity 06/24/2019    Absolute anemia 03/05/2018   S/P ICD (internal cardiac defibrillator) procedure 01/17/2018   Type 2 diabetes mellitus ([place]) 12/14/2015   Cardiomyopathy ([place]) 12/14/2015   Hyperlipidemia 12/14/2015   Squamous cell carcinoma of skin of left cheek 10/26/2015   Congestive heart failure ([place]) 10/09/2013   Action  tremor 06/04/2013   Personal history of other malignant neoplasm of skin 11/14/2012   Ankylosis of spine 04/20/2011   Fracture of T6 vertebra ([place]) 04/20/2011   Hiatal hernia 09/02/2010   Barrett's esophagus 09/02/2010   Carotid stenosis, bilateral 09/02/2010   Hypertension 08/31/2010   CAD (coronary artery disease) 08/31/2010   PAD (peripheral artery disease) ([place]) 08/31/2010   DDD (degenerative disc disease), lumbar 08/31/2010   Chronic low back pain 08/31/2010   Actinic keratosis 08/31/2010   Melanoma ([place]) 08/31/2010   Anterior circulation transient ischemic attack 05/22/2009   Stroke ([place]) 10/25/2005    Kathrynn Ducking, PTA 06/06/2021, 1:53 PM  [facility] [place], [place], [address] Phone: [phone]   Fax:  [phone]  Name: Edward Heath MRN: [id number] Date of Birth: May 05, 1946

## 2021-06-08 ENCOUNTER — Ambulatory Visit: Payer: Medicare Other

## 2021-06-08 ENCOUNTER — Other Ambulatory Visit: Payer: Self-pay

## 2021-06-08 DIAGNOSIS — R262 Difficulty in walking, not elsewhere classified: Secondary | ICD-10-CM

## 2021-06-08 DIAGNOSIS — Z9181 History of falling: Secondary | ICD-10-CM

## 2021-06-08 DIAGNOSIS — M6281 Muscle weakness (generalized): Secondary | ICD-10-CM | POA: Diagnosis not present

## 2021-06-08 NOTE — Therapy (Signed)
[facility] [place], [place], [address] Phone: [phone]   Fax:  [phone]  Physical Therapy Treatment  Patient Details  Name: Edward Heath MRN: [id number] Date of Birth: 05-06-1946 Referring Provider (PT): Stacks, MD   Encounter Date: 06/08/2021   PT End of Session - 06/08/21 1307     Visit Number 6    Number of Visits 12    Date for PT Re-Evaluation 08/16/21    PT Start Time 4401    PT Stop Time 0272    PT Time Calculation (min) 43 min    Activity Tolerance Patient tolerated treatment well    Behavior During Therapy [facility] for tasks assessed/performed             Past Medical History:  Diagnosis Date   AICD (automatic cardioverter/defibrillator) present    Arthritis    CHF (congestive heart failure) ([place])    Coronary artery disease    Diabetes mellitus without complication ([place])    Dysrhythmia    GERD (gastroesophageal reflux disease)    Heart attack ([place]) 2004, 2015   History of kidney stones    Hyperlipidemia    Hypertension    Hypothyroidism    Lumbar vertebral fracture ([place]) 2012   NSTEMI, initial episode of care ([place]) 10/09/2013   Skin cancer 06/2014   left cheek   Stroke ([place])    x3   Thyroid disease    TIA (transient ischemic attack) 2009    Past Surgical History:  Procedure Laterality Date   CARDIAC DEFIBRILLATOR PLACEMENT  12/2003, Repaired in 2012   x3   [place]  06/2014   had 4 removed   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   Procedure: LEFT ELBOW ULNAR NERVE DECOMPRESSION;  Surgeon: [name], MD;  Location: [facility];  Service: Orthopedics;  Laterality: Left;    There were no vitals filed for this visit.   Subjective Assessment - 06/08/21 1305     Subjective Patient reports that his knee and back feels about the same today. However, he feel alright otherwise.    Pertinent History HTN, CAD, PAD, history of stroke and TIA, diabetes,  chronic low back    Limitations Walking;Standing    How long can you walk comfortably? only around his house without an assistive device, but will use a walker for short community distances or a scooter for long distances    Diagnostic tests xrays    Patient Stated Goals improve his balance, play golf by this spring    Currently in Pain? Yes    Pain Score 5     Pain Location Back    Pain Type Chronic pain    Pain Onset More than a month ago                               [facility] Adult PT Treatment/Exercise - 06/08/21 0001       Knee/Hip Exercises: Stretches   Passive Hamstring Stretch Right;Left;4 reps;30 seconds      Knee/Hip Exercises: Aerobic   Nustep Lvl 4-5 x 15 mins      Knee/Hip Exercises: Standing   Hip Flexion Both;Knee bent   with UE support from his walker   Hip Flexion Limitations 2 minutes    Hip Extension Both;Knee straight   with BUE support from walker   Extension Limitations 1.5 minutes  Knee/Hip Exercises: Seated   Long Arc Quad Strengthening;Both;Weights    Long Arc Quad Weight 3 lbs.    Long Arc Quad Limitations 3 minutes    Ball Squeeze 3 minutes   with 5 second hold                         PT Long Term Goals - 06/06/21 1305       PT LONG TERM GOAL #1   Title Patient will be independent with his HEP    Time 6    Period Weeks    Status On-going    Target Date 06/29/21      PT LONG TERM GOAL #2   Title Patient will be able to safely reach down to pick up an object from the floor.    Time 6    Period Weeks    Status On-going    Target Date 06/29/21      PT LONG TERM GOAL #3   Title Patient will be able to improve his 5x sit to stand to at least 23 seconds or better.    Time 6    Period Weeks    Status On-going    Target Date 06/29/21                   Plan - 06/08/21 1308     Clinical Impression Statement Patient was introduced to multiple new standing interventions with moderate difficulty  and fatigue. Fatigue was his primary limitation with today's interventions and standing interventions were incorporated within his familiar seated interventions. He required minimal cuing with today's interventions for proper pacing to avoid a significant increase in fatigue.  He reported feeling alright upon the conclusion of treatment. He continues to require skilled physical therapy to address his remaining impairments to maximize his functional mobility.    Personal Factors and Comorbidities Comorbidity 1;Comorbidity 2;Comorbidity 3+;Time since onset of injury/illness/exacerbation    Comorbidities HTN, CAD, PAD, history of stroke and TIA, diabetes, chronic low back    Examination-Activity Limitations Locomotion Level;Transfers;Carry;Squat;Bend;Stand;Lift    Examination-Participation Restrictions Cleaning;Yard Work;Other    Stability/Clinical Decision Making Unstable/Unpredictable    Rehab Potential Fair    PT Frequency 2x / week    PT Duration 6 weeks    PT Treatment/Interventions ADLs/Self Care Home Management;Gait training;Stair training;Functional mobility training;Therapeutic activities;Therapeutic exercise;Balance training;Neuromuscular re-education;Patient/family education;Energy conservation    PT Next Visit Plan nustep, lower extremity strengthening    Consulted and Agree with Plan of Care Patient             Patient will benefit from skilled therapeutic intervention in order to improve the following deficits and impairments:  Abnormal gait, Difficulty walking, Decreased activity tolerance, Decreased balance, Impaired sensation, Decreased strength, Decreased mobility  Visit Diagnosis: Difficulty in walking, not elsewhere classified  History of falling  Muscle weakness (generalized)     Problem List Patient Active Problem List   Diagnosis Date Noted   Unilateral primary osteoarthritis, right knee 08/26/2020   S/P fusion of thoracic spine 03/12/2020   S/P cubital tunnel  release 09/05/2019   Seborrheic keratoses 07/16/2019   Cubital tunnel syndrome on left 07/03/2019   Numbness and tingling in left hand 06/24/2019   Ulnar neuropathy of left upper extremity 06/24/2019   Absolute anemia 03/05/2018   S/P ICD (internal cardiac defibrillator) procedure 01/17/2018   Type 2 diabetes mellitus ([place]) 12/14/2015   Cardiomyopathy ([place]) 12/14/2015   Hyperlipidemia 12/14/2015   Squamous cell  carcinoma of skin of left cheek 10/26/2015   Congestive heart failure ([place]) 10/09/2013   Action tremor 06/04/2013   Personal history of other malignant neoplasm of skin 11/14/2012   Ankylosis of spine 04/20/2011   Fracture of T6 vertebra ([place]) 04/20/2011   Hiatal hernia 09/02/2010   Barrett's esophagus 09/02/2010   Carotid stenosis, bilateral 09/02/2010   Hypertension 08/31/2010   CAD (coronary artery disease) 08/31/2010   PAD (peripheral artery disease) ([place]) 08/31/2010   DDD (degenerative disc disease), lumbar 08/31/2010   Chronic low back pain 08/31/2010   Actinic keratosis 08/31/2010   Melanoma ([place]) 08/31/2010   Anterior circulation transient ischemic attack 05/22/2009   Stroke ([place]) 10/25/2005    Darlin Coco, PT 06/08/2021, 2:52 PM  [facility] [place], [place], [address] Phone: [phone]   Fax:  [phone]  Name: Edward Heath MRN: [id number] Date of Birth: 1945-09-23

## 2021-06-13 ENCOUNTER — Ambulatory Visit: Payer: Medicare Other

## 2021-06-13 ENCOUNTER — Other Ambulatory Visit: Payer: Self-pay

## 2021-06-13 DIAGNOSIS — M6281 Muscle weakness (generalized): Secondary | ICD-10-CM

## 2021-06-13 DIAGNOSIS — Z9181 History of falling: Secondary | ICD-10-CM | POA: Diagnosis not present

## 2021-06-13 DIAGNOSIS — R262 Difficulty in walking, not elsewhere classified: Secondary | ICD-10-CM

## 2021-06-13 NOTE — Therapy (Signed)
[facility] [place], [place], [address] Phone: [phone]   Fax:  [phone]  Physical Therapy Treatment  Patient Details  Name: Edward Heath MRN: [id number] Date of Birth: 1945-10-14 Referring Provider (PT): Stacks, MD   Encounter Date: 06/13/2021   PT End of Session - 06/13/21 1305     Visit Number 7    Number of Visits 12    Date for PT Re-Evaluation 08/16/21    PT Start Time 1300    PT Stop Time 1345    PT Time Calculation (min) 45 min    Activity Tolerance Patient tolerated treatment well    Behavior During Therapy [facility] for tasks assessed/performed             Past Medical History:  Diagnosis Date   AICD (automatic cardioverter/defibrillator) present    Arthritis    CHF (congestive heart failure) ([place])    Coronary artery disease    Diabetes mellitus without complication ([place])    Dysrhythmia    GERD (gastroesophageal reflux disease)    Heart attack ([place]) 2004, 2015   History of kidney stones    Hyperlipidemia    Hypertension    Hypothyroidism    Lumbar vertebral fracture ([place]) 2012   NSTEMI, initial episode of care ([place]) 10/09/2013   Skin cancer 06/2014   left cheek   Stroke ([place])    x3   Thyroid disease    TIA (transient ischemic attack) 2009    Past Surgical History:  Procedure Laterality Date   CARDIAC DEFIBRILLATOR PLACEMENT  12/2003, Repaired in 2012   x3   [place]  06/2014   had 4 removed   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   Procedure: LEFT ELBOW ULNAR NERVE DECOMPRESSION;  Surgeon: [name], MD;  Location: [facility];  Service: Orthopedics;  Laterality: Left;    There were no vitals filed for this visit.   Subjective Assessment - 06/13/21 1302     Subjective Pt arrives for today's treatment reporting 5/10 RLE pain.  Pt states he is feeling "really good" in other areas.    Pertinent History HTN, CAD, PAD, history of stroke and TIA,  diabetes, chronic low back    Limitations Walking;Standing    How long can you walk comfortably? only around his house without an assistive device, but will use a walker for short community distances or a scooter for long distances    Diagnostic tests xrays    Patient Stated Goals improve his balance, play golf by this spring    Currently in Pain? Yes    Pain Score 5     Pain Location Leg    Pain Orientation Right    Pain Onset More than a month ago                               [facility] Adult PT Treatment/Exercise - 06/13/21 0001       Knee/Hip Exercises: Aerobic   Nustep Lvl 5 x 15 mins      Knee/Hip Exercises: Seated   Long Arc Quad Strengthening;Both;Weights    Long Arc Quad Weight 3 lbs.    Long Arc Quad Limitations 3 mins    Ball Squeeze 3 minutes   5 sec hold   Clamshell with TheraBand Red   2 mins   Other Seated Knee/Hip Exercises Heel Raises/Toe Raises 3# x 20 reps  Marching Strengthening;Both;Weights    Marching Limitations 3    Marching Weights 3 lbs.    Hamstring Curl Strengthening;Both;20 reps    Hamstring Limitations Red tband    Sit to [place] 10 reps;with UE support   cues for eccentric control                         PT Long Term Goals - 06/06/21 1305       PT LONG TERM GOAL #1   Title Patient will be independent with his HEP    Time 6    Period Weeks    Status On-going    Target Date 06/29/21      PT LONG TERM GOAL #2   Title Patient will be able to safely reach down to pick up an object from the floor.    Time 6    Period Weeks    Status On-going    Target Date 06/29/21      PT LONG TERM GOAL #3   Title Patient will be able to improve his 5x sit to stand to at least 23 seconds or better.    Time 6    Period Weeks    Status On-going    Target Date 06/29/21                   Plan - 06/13/21 1305     Clinical Impression Statement Pt arrives for today's treatment session reporting 5/10 right LE  pain.  Pt continues to be limited by fatigue and deferred standing exercises today.  Pt states that he is performing exercises at home as well as using a Nustep like machine for endurance and cardio.  Pt requiring min cues to perform full ROM with numerous reps during today's treatment esession.  Pt requiring significant cues for eccentric control during STS reps.  Pt reported 5/10 RLE pain at completion of today's treatment session.    Personal Factors and Comorbidities Comorbidity 1;Comorbidity 2;Comorbidity 3+;Time since onset of injury/illness/exacerbation    Comorbidities HTN, CAD, PAD, history of stroke and TIA, diabetes, chronic low back    Examination-Activity Limitations Locomotion Level;Transfers;Carry;Squat;Bend;Stand;Lift    Examination-Participation Restrictions Cleaning;Yard Work;Other    Stability/Clinical Decision Making Unstable/Unpredictable    Rehab Potential Fair    PT Frequency 2x / week    PT Duration 6 weeks    PT Treatment/Interventions ADLs/Self Care Home Management;Gait training;Stair training;Functional mobility training;Therapeutic activities;Therapeutic exercise;Balance training;Neuromuscular re-education;Patient/family education;Energy conservation    PT Next Visit Plan nustep, lower extremity strengthening    Consulted and Agree with Plan of Care Patient             Patient will benefit from skilled therapeutic intervention in order to improve the following deficits and impairments:  Abnormal gait, Difficulty walking, Decreased activity tolerance, Decreased balance, Impaired sensation, Decreased strength, Decreased mobility  Visit Diagnosis: Difficulty in walking, not elsewhere classified  History of falling  Muscle weakness (generalized)     Problem List Patient Active Problem List   Diagnosis Date Noted   Unilateral primary osteoarthritis, right knee 08/26/2020   S/P fusion of thoracic spine 03/12/2020   S/P cubital tunnel release 09/05/2019    Seborrheic keratoses 07/16/2019   Cubital tunnel syndrome on left 07/03/2019   Numbness and tingling in left hand 06/24/2019   Ulnar neuropathy of left upper extremity 06/24/2019   Absolute anemia 03/05/2018   S/P ICD (internal cardiac defibrillator) procedure 01/17/2018   Type 2 diabetes mellitus (  [place]) 12/14/2015   Cardiomyopathy ([place]) 12/14/2015   Hyperlipidemia 12/14/2015   Squamous cell carcinoma of skin of left cheek 10/26/2015   Congestive heart failure ([place]) 10/09/2013   Action tremor 06/04/2013   Personal history of other malignant neoplasm of skin 11/14/2012   Ankylosis of spine 04/20/2011   Fracture of T6 vertebra ([place]) 04/20/2011   Hiatal hernia 09/02/2010   Barrett's esophagus 09/02/2010   Carotid stenosis, bilateral 09/02/2010   Hypertension 08/31/2010   CAD (coronary artery disease) 08/31/2010   PAD (peripheral artery disease) ([place]) 08/31/2010   DDD (degenerative disc disease), lumbar 08/31/2010   Chronic low back pain 08/31/2010   Actinic keratosis 08/31/2010   Melanoma ([place]) 08/31/2010   Anterior circulation transient ischemic attack 05/22/2009   Stroke ([place]) 10/25/2005    Kathrynn Ducking, PTA 06/13/2021, 1:51 PM  [facility] [place], [place], [address] Phone: [phone]   Fax:  [phone]  Name: OMEED OSUNA MRN: [id number] Date of Birth: February 15, 1946

## 2021-06-15 ENCOUNTER — Ambulatory Visit: Payer: Medicare Other

## 2021-06-15 ENCOUNTER — Other Ambulatory Visit: Payer: Self-pay

## 2021-06-15 DIAGNOSIS — R262 Difficulty in walking, not elsewhere classified: Secondary | ICD-10-CM

## 2021-06-15 DIAGNOSIS — Z9181 History of falling: Secondary | ICD-10-CM | POA: Diagnosis not present

## 2021-06-15 DIAGNOSIS — M6281 Muscle weakness (generalized): Secondary | ICD-10-CM

## 2021-06-15 NOTE — Therapy (Signed)
[facility] [place], [place], [address] Phone: [phone]   Fax:  [phone]  Physical Therapy Treatment  Patient Details  Name: Edward Heath MRN: [id number] Date of Birth: [birth year] Referring Provider (PT): Stacks, MD   Encounter Date: 06/15/2021   PT End of Session - 06/15/21 1303     Visit Number 8    Number of Visits 12    Date for PT Re-Evaluation 08/16/21    PT Start Time 1300    PT Stop Time 1347    PT Time Calculation (min) 47 min    Activity Tolerance Patient tolerated treatment well    Behavior During Therapy [facility] for tasks assessed/performed             Past Medical History:  Diagnosis Date   AICD (automatic cardioverter/defibrillator) present    Arthritis    CHF (congestive heart failure) ([place])    Coronary artery disease    Diabetes mellitus without complication ([place])    Dysrhythmia    GERD (gastroesophageal reflux disease)    Heart attack ([place]) 2004, 2015   History of kidney stones    Hyperlipidemia    Hypertension    Hypothyroidism    Lumbar vertebral fracture ([place]) 2012   NSTEMI, initial episode of care ([place]) 10/09/2013   Skin cancer 06/2014   left cheek   Stroke ([place])    x3   Thyroid disease    TIA (transient ischemic attack) 2009    Past Surgical History:  Procedure Laterality Date   CARDIAC DEFIBRILLATOR PLACEMENT  12/2003, Repaired in 2012   x3   [place]  06/2014   had 4 removed   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   Procedure: LEFT ELBOW ULNAR NERVE DECOMPRESSION;  Surgeon: [name], MD;  Location: [place];  Service: Orthopedics;  Laterality: Left;    There were no vitals filed for this visit.   Subjective Assessment - 06/15/21 1259     Subjective Pt arrives for today's treatment session reporting 5/10 right foot pain.    Pertinent History HTN, CAD, PAD, history of stroke and TIA, diabetes, chronic low back     Limitations Walking;Standing    How long can you walk comfortably? only around his house without an assistive device, but will use a walker for short community distances or a scooter for long distances    Diagnostic tests xrays    Patient Stated Goals improve his balance, play golf by this spring    Currently in Pain? Yes    Pain Score 5     Pain Location Foot    Pain Orientation Right    Pain Onset More than a month ago                               [facility] Adult PT Treatment/Exercise - 06/15/21 0001       Knee/Hip Exercises: Aerobic   Nustep Lvl 5 x 18 mins      Knee/Hip Exercises: Standing   Heel Raises Both   2 mins   Heel Raises Limitations Toe Raises x 2 mins    Hip Flexion Both;Knee bent    Hip Flexion Limitations 2 mins      Knee/Hip Exercises: Seated   Long Arc Quad Strengthening;Both;Weights    Long Arc Quad Weight 3 lbs.    Long [employer] Limitations 3 mins  Ball Squeeze 3 mins   3 sec hold   Clamshell with TheraBand Red   3 mins   Marching Strengthening;Both;Weights    Marching Limitations 3 min    Marching Weights 3 lbs.    Hamstring Curl Strengthening;Both;20 reps    Hamstring Limitations Red tband                          PT Long Term Goals - 06/06/21 1305       PT LONG TERM GOAL #1   Title Patient will be independent with his HEP    Time 6    Period Weeks    Status On-going    Target Date 06/29/21      PT LONG TERM GOAL #2   Title Patient will be able to safely reach down to pick up an object from the floor.    Time 6    Period Weeks    Status On-going    Target Date 06/29/21      PT LONG TERM GOAL #3   Title Patient will be able to improve his 5x sit to stand to at least 23 seconds or better.    Time 6    Period Weeks    Status On-going    Target Date 06/29/21                   Plan - 06/15/21 1303     Clinical Impression Statement Pt arrives for today's treatment session reporting 5/10  right foot pain.  Pt able to progress to several standing exercises today, but continues to be limited by lack of activity tolerance and endurance.  Pt continues to requiring cues to perform full ROM with various seated LE exercises.  Pt reported 5/10 right foot pain at completion of today's treatment session.    Personal Factors and Comorbidities Comorbidity 1;Comorbidity 2;Comorbidity 3+;Time since onset of injury/illness/exacerbation    Comorbidities HTN, CAD, PAD, history of stroke and TIA, diabetes, chronic low back    Examination-Activity Limitations Locomotion Level;Transfers;Carry;Squat;Bend;Stand;Lift    Examination-Participation Restrictions Cleaning;Yard Work;Other    Stability/Clinical Decision Making Unstable/Unpredictable    Rehab Potential Fair    PT Frequency 2x / week    PT Duration 6 weeks    PT Treatment/Interventions ADLs/Self Care Home Management;Gait training;Stair training;Functional mobility training;Therapeutic activities;Therapeutic exercise;Balance training;Neuromuscular re-education;Patient/family education;Energy conservation    PT Next Visit Plan nustep, lower extremity strengthening    Consulted and Agree with Plan of Care Patient             Patient will benefit from skilled therapeutic intervention in order to improve the following deficits and impairments:  Abnormal gait, Difficulty walking, Decreased activity tolerance, Decreased balance, Impaired sensation, Decreased strength, Decreased mobility  Visit Diagnosis: Difficulty in walking, not elsewhere classified  History of falling  Muscle weakness (generalized)     Problem List Patient Active Problem List   Diagnosis Date Noted   Unilateral primary osteoarthritis, right knee 08/26/2020   S/P fusion of thoracic spine 03/12/2020   S/P cubital tunnel release 09/05/2019   Seborrheic keratoses 07/16/2019   Cubital tunnel syndrome on left 07/03/2019   Numbness and tingling in left hand 06/24/2019    Ulnar neuropathy of left upper extremity 06/24/2019   Absolute anemia 03/05/2018   S/P ICD (internal cardiac defibrillator) procedure 01/17/2018   Type 2 diabetes mellitus ([place]) 12/14/2015   Cardiomyopathy ([place]) 12/14/2015   Hyperlipidemia 12/14/2015   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 10/26/2015   Congestive heart failure ([place]) 10/09/2013   Action tremor 06/04/2013   Personal history of other malignant neoplasm of skin 11/14/2012   Ankylosis of spine 04/20/2011   Fracture of T6 vertebra ([place]) 04/20/2011   Hiatal hernia 09/02/2010   Barrett's esophagus 09/02/2010   Carotid stenosis, bilateral 09/02/2010   Hypertension 08/31/2010   CAD (coronary artery disease) 08/31/2010   PAD (peripheral artery disease) ([place]) 08/31/2010   DDD (degenerative disc disease), lumbar 08/31/2010   Chronic low back pain 08/31/2010   Actinic keratosis 08/31/2010   Melanoma ([place]) 08/31/2010   Anterior circulation transient ischemic attack 05/22/2009   Stroke ([place]) 10/25/2005    Kathrynn Ducking, PTA 06/15/2021, 1:50 PM  [facility] [place], [place], [address] Phone: [phone]   Fax:  [phone]  Name: Edward Heath MRN: [id number] Date of Birth: 01-Mar-1946

## 2021-06-20 ENCOUNTER — Other Ambulatory Visit: Payer: Self-pay

## 2021-06-20 ENCOUNTER — Ambulatory Visit: Payer: Medicare Other

## 2021-06-20 DIAGNOSIS — M6281 Muscle weakness (generalized): Secondary | ICD-10-CM

## 2021-06-20 DIAGNOSIS — Z9181 History of falling: Secondary | ICD-10-CM | POA: Diagnosis not present

## 2021-06-20 DIAGNOSIS — R262 Difficulty in walking, not elsewhere classified: Secondary | ICD-10-CM

## 2021-06-20 NOTE — Therapy (Signed)
[facility] [place], [place], [address] Phone: [phone]   Fax:  [phone]  Physical Therapy Treatment  Patient Details  Name: Edward Heath MRN: [id number] Date of Birth: 1945-07-07 Referring Provider (PT): Stacks, MD   Encounter Date: 06/20/2021   PT End of Session - 06/20/21 1256     Visit Number 9    Number of Visits 12    Date for PT Re-Evaluation 08/16/21    PT Start Time 1300    PT Stop Time 1345    PT Time Calculation (min) 45 min    Activity Tolerance Patient tolerated treatment well    Behavior During Therapy [facility] for tasks assessed/performed             Past Medical History:  Diagnosis Date   AICD (automatic cardioverter/defibrillator) present    Arthritis    CHF (congestive heart failure) ([place])    Coronary artery disease    Diabetes mellitus without complication ([place])    Dysrhythmia    GERD (gastroesophageal reflux disease)    Heart attack ([place]) 2004, 2015   History of kidney stones    Hyperlipidemia    Hypertension    Hypothyroidism    Lumbar vertebral fracture ([place]) 2012   NSTEMI, initial episode of care ([place]) 10/09/2013   Skin cancer 06/2014   left cheek   Stroke ([place])    x3   Thyroid disease    TIA (transient ischemic attack) 2009    Past Surgical History:  Procedure Laterality Date   CARDIAC DEFIBRILLATOR PLACEMENT  12/2003, Repaired in 2012   x3   [place]  06/2014   had 4 removed   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   Procedure: LEFT ELBOW ULNAR NERVE DECOMPRESSION;  Surgeon: [name], MD;  Location: [place];  Service: Orthopedics;  Laterality: Left;    There were no vitals filed for this visit.   Subjective Assessment - 06/20/21 1255     Subjective Pt arrives for today's treatment session reporting 5/10 right lower extremity.    Pertinent History HTN, CAD, PAD, history of stroke and TIA, diabetes, chronic low back     Limitations Walking;Standing    How long can you walk comfortably? only around his house without an assistive device, but will use a walker for short community distances or a scooter for long distances    Diagnostic tests xrays    Patient Stated Goals improve his balance, play golf by this spring    Currently in Pain? Yes    Pain Score 5     Pain Location Leg    Pain Orientation Right    Pain Onset More than a month ago                               [facility] Adult PT Treatment/Exercise - 06/20/21 0001       Knee/Hip Exercises: Aerobic   Nustep Lvl 5 x 20 mins      Knee/Hip Exercises: Seated   Long Arc Quad Strengthening;Both;Weights    Long Arc Quad Weight 3 lbs.    Long Arc Quad Limitations 3 mins    Ball Squeeze 3 mins    Clamshell with TheraBand Red   2 mins   Other Seated Knee/Hip Exercises Heel Raises/Toe Raises 3# x 20 reps    Marching Strengthening;Both;Weights    Marching Limitations 3 min  Marching Weights 3 lbs.    Hamstring Curl Strengthening;Both;20 reps    Hamstring Limitations Red tband    Sit to Sand 10 reps;without UE support   RUE support                         PT Long Term Goals - 06/06/21 1305       PT LONG TERM GOAL #1   Title Patient will be independent with his HEP    Time 6    Period Weeks    Status On-going    Target Date 06/29/21      PT LONG TERM GOAL #2   Title Patient will be able to safely reach down to pick up an object from the floor.    Time 6    Period Weeks    Status On-going    Target Date 06/29/21      PT LONG TERM GOAL #3   Title Patient will be able to improve his 5x sit to stand to at least 23 seconds or better.    Time 6    Period Weeks    Status On-going    Target Date 06/29/21                   Plan - 06/20/21 1256     Clinical Impression Statement Pt arrives for today's treatment session reporting 5/10 right LE pain.  Pt able to tolerate 20 mins on Nustep today to  increase endurance and activity tolerance.  Pt reports increased fatigue today in general.  Pt requiring the assist of right upper extremity with all sit to stand transfers during today's treatment session.  Pt reported 5/10 right LE pain at completion of today's treatment session.    Personal Factors and Comorbidities Comorbidity 1;Comorbidity 2;Comorbidity 3+;Time since onset of injury/illness/exacerbation    Comorbidities HTN, CAD, PAD, history of stroke and TIA, diabetes, chronic low back    Examination-Activity Limitations Locomotion Level;Transfers;Carry;Squat;Bend;Stand;Lift    Examination-Participation Restrictions Cleaning;Yard Work;Other    Stability/Clinical Decision Making Unstable/Unpredictable    Rehab Potential Fair    PT Frequency 2x / week    PT Duration 6 weeks    PT Treatment/Interventions ADLs/Self Care Home Management;Gait training;Stair training;Functional mobility training;Therapeutic activities;Therapeutic exercise;Balance training;Neuromuscular re-education;Patient/family education;Energy conservation    PT Next Visit Plan nustep, lower extremity strengthening    Consulted and Agree with Plan of Care Patient             Patient will benefit from skilled therapeutic intervention in order to improve the following deficits and impairments:  Abnormal gait, Difficulty walking, Decreased activity tolerance, Decreased balance, Impaired sensation, Decreased strength, Decreased mobility  Visit Diagnosis: Difficulty in walking, not elsewhere classified  History of falling  Muscle weakness (generalized)     Problem List Patient Active Problem List   Diagnosis Date Noted   Unilateral primary osteoarthritis, right knee 08/26/2020   S/P fusion of thoracic spine 03/12/2020   S/P cubital tunnel release 09/05/2019   Seborrheic keratoses 07/16/2019   Cubital tunnel syndrome on left 07/03/2019   Numbness and tingling in left hand 06/24/2019   Ulnar neuropathy of left  upper extremity 06/24/2019   Absolute anemia 03/05/2018   S/P ICD (internal cardiac defibrillator) procedure 01/17/2018   Type 2 diabetes mellitus ([place]) 12/14/2015   Cardiomyopathy ([place]) 12/14/2015   Hyperlipidemia 12/14/2015   Squamous cell carcinoma of skin of left cheek 10/26/2015   Congestive heart failure ([place]) 10/09/2013  Action tremor 06/04/2013   Personal history of other malignant neoplasm of skin 11/14/2012   Ankylosis of spine 04/20/2011   Fracture of T6 vertebra ([place]) 04/20/2011   Hiatal hernia 09/02/2010   Barrett's esophagus 09/02/2010   Carotid stenosis, bilateral 09/02/2010   Hypertension 08/31/2010   CAD (coronary artery disease) 08/31/2010   PAD (peripheral artery disease) ([place]) 08/31/2010   DDD (degenerative disc disease), lumbar 08/31/2010   Chronic low back pain 08/31/2010   Actinic keratosis 08/31/2010   Melanoma ([place]) 08/31/2010   Anterior circulation transient ischemic attack 05/22/2009   Stroke ([place]) 10/25/2005    Kathrynn Ducking, PTA 06/20/2021, 1:59 PM  [facility] [place], [place], [address] Phone: [phone]   Fax:  [phone]  Name: Edward Heath MRN: [id number] Date of Birth: 10/04/1945

## 2021-06-22 ENCOUNTER — Other Ambulatory Visit: Payer: Self-pay

## 2021-06-22 ENCOUNTER — Ambulatory Visit: Payer: Medicare Other | Attending: Orthopedic Surgery

## 2021-06-22 DIAGNOSIS — M6281 Muscle weakness (generalized): Secondary | ICD-10-CM | POA: Insufficient documentation

## 2021-06-22 DIAGNOSIS — Z9181 History of falling: Secondary | ICD-10-CM | POA: Diagnosis not present

## 2021-06-22 DIAGNOSIS — R262 Difficulty in walking, not elsewhere classified: Secondary | ICD-10-CM | POA: Insufficient documentation

## 2021-06-22 NOTE — Therapy (Addendum)
[facility] [place], [place], [address] Phone: [phone]   Fax:  [phone]  Physical Therapy Treatment  Patient Details  Name: Edward Heath MRN: [id number] Date of Birth: 07-31-1945 Referring Provider (PT): Stacks, MD   Encounter Date: 06/22/2021   PT End of Session - 06/22/21 1245     Visit Number 10    Number of Visits 12    Date for PT Re-Evaluation 08/16/21    PT Start Time 1243    PT Stop Time 2637    PT Time Calculation (min) 52 min    Activity Tolerance Patient tolerated treatment well    Behavior During Therapy [facility] for tasks assessed/performed             Past Medical History:  Diagnosis Date   AICD (automatic cardioverter/defibrillator) present    Arthritis    CHF (congestive heart failure) ([place])    Coronary artery disease    Diabetes mellitus without complication ([place])    Dysrhythmia    GERD (gastroesophageal reflux disease)    Heart attack ([place]) 2004, 2015   History of kidney stones    Hyperlipidemia    Hypertension    Hypothyroidism    Lumbar vertebral fracture ([place]) 2012   NSTEMI, initial episode of care ([place]) 10/09/2013   Skin cancer 06/2014   left cheek   Stroke ([place])    x3   Thyroid disease    TIA (transient ischemic attack) 2009    Past Surgical History:  Procedure Laterality Date   CARDIAC DEFIBRILLATOR PLACEMENT  12/2003, Repaired in 2012   x3   [place]  06/2014   had 4 removed   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   Procedure: LEFT ELBOW ULNAR NERVE DECOMPRESSION;  Surgeon: [name], MD;  Location: [facility];  Service: Orthopedics;  Laterality: Left;    There were no vitals filed for this visit.   Subjective Assessment - 06/22/21 1244     Subjective Pt arrives for today's treatment session reporting 5/10 right lower extremity pain.  Pt also reports B hip soreness from riding his recumbent bike at home last night.     Pertinent History HTN, CAD, PAD, history of stroke and TIA, diabetes, chronic low back    Limitations Walking;Standing    How long can you walk comfortably? only around his house without an assistive device, but will use a walker for short community distances or a scooter for long distances    Diagnostic tests xrays    Patient Stated Goals improve his balance, play golf by this spring    Currently in Pain? Yes    Pain Score 5     Pain Location Leg    Pain Orientation Right    Pain Onset More than a month ago                               [facility] Adult PT Treatment/Exercise - 06/22/21 0001       Knee/Hip Exercises: Stretches   Active Hamstring Stretch Both;30 seconds;2 reps      Knee/Hip Exercises: Aerobic   Nustep Lvl 5 x 20 mins      Knee/Hip Exercises: Seated   Long Arc Quad Strengthening;Both;Weights    Long Arc Quad Weight 3 lbs.    Long Arc Quad Limitations 3 mins    Ball Squeeze 3 mins    Clamshell  with [employer]raBand Red   3 min   Other Seated Knee/Hip Exercises Heel Raises/Toe Raises 3# x 3 mins    Marching Strengthening;Both;Weights    Marching Limitations 3 min    Marching Weights 3 lbs.    Sit to Sand 10 reps;without UE support                          PT Long Term Goals - 06/22/21 1246       PT LONG TERM GOAL #1   Title Patient will be independent with his HEP    Time 6    Period Weeks    Status Achieved    Target Date 06/29/21      PT LONG TERM GOAL #2   Title Patient will be able to safely reach down to pick up an object from the floor.    Baseline 06/22/21: Pt able to pick up dropped object with left UE support and close sup.    Time 6    Period Weeks    Status On-going    Target Date 06/29/21      PT LONG TERM GOAL #3   Title Patient will be able to improve his 5x sit to stand to at least 23 seconds or better.    Baseline 06/22/21: 36 seconds, with significant LOB x 1 and UE support    Time 6    Period Weeks    Status  On-going    Target Date 06/29/21                   Plan - 06/22/21 1245     Clinical Impression Statement Pt arrives for today's treatment session reporting 5/10 right LE pain.  Pt reports that he was able to perform 30 mins on his personal recumbent bike last night, but has B soreness today.  During pt's 5 STS test, pt experienced significant LOB requiring min/CGA +1 to correct resulting in 36 second time.  Pt observed picking up an object from the floor with left upper extremity support and close sup/CGA +1 for safety and to reduce fall risk.  Pt reported 5/10 RLE pain at completion of today's treatment session.    Personal Factors and Comorbidities Comorbidity 1;Comorbidity 2;Comorbidity 3+;Time since onset of injury/illness/exacerbation    Comorbidities HTN, CAD, PAD, history of stroke and TIA, diabetes, chronic low back    Examination-Activity Limitations Locomotion Level;Transfers;Carry;Squat;Bend;Stand;Lift    Examination-Participation Restrictions Cleaning;Yard Work;Other    Stability/Clinical Decision Making Unstable/Unpredictable    Rehab Potential Fair    PT Frequency 2x / week    PT Duration 6 weeks    PT Treatment/Interventions ADLs/Self Care Home Management;Gait training;Stair training;Functional mobility training;Therapeutic activities;Therapeutic exercise;Balance training;Neuromuscular re-education;Patient/family education;Energy conservation    PT Next Visit Plan nustep, lower extremity strengthening    Consulted and Agree with Plan of Care Patient             Patient will benefit from skilled therapeutic intervention in order to improve the following deficits and impairments:  Abnormal gait, Difficulty walking, Decreased activity tolerance, Decreased balance, Impaired sensation, Decreased strength, Decreased mobility  Visit Diagnosis: Difficulty in walking, not elsewhere classified  History of falling  Muscle weakness (generalized)     Problem  List Patient Active Problem List   Diagnosis Date Noted   Unilateral primary osteoarthritis, right knee 08/26/2020   S/P fusion of thoracic spine 03/12/2020   S/P cubital tunnel release 09/05/2019   Seborrheic keratoses 07/16/2019  Cubital tunnel syndrome on left 07/03/2019   Numbness and tingling in left hand 06/24/2019   Ulnar neuropathy of left upper extremity 06/24/2019   Absolute anemia 03/05/2018   S/P ICD (internal cardiac defibrillator) procedure 01/17/2018   Type 2 diabetes mellitus ([place]) 12/14/2015   Cardiomyopathy ([place]) 12/14/2015   Hyperlipidemia 12/14/2015   Squamous cell carcinoma of skin of left cheek 10/26/2015   Congestive heart failure ([place]) 10/09/2013   Action tremor 06/04/2013   Personal history of other malignant neoplasm of skin 11/14/2012   Ankylosis of spine 04/20/2011   Fracture of T6 vertebra ([place]) 04/20/2011   Hiatal hernia 09/02/2010   Barrett's esophagus 09/02/2010   Carotid stenosis, bilateral 09/02/2010   Hypertension 08/31/2010   CAD (coronary artery disease) 08/31/2010   PAD (peripheral artery disease) ([place]) 08/31/2010   DDD (degenerative disc disease), lumbar 08/31/2010   Chronic low back pain 08/31/2010   Actinic keratosis 08/31/2010   Melanoma ([place]) 08/31/2010   Anterior circulation transient ischemic attack 05/22/2009   Stroke ([place]) 10/25/2005    Kathrynn Ducking, PTA 06/22/2021, 1:43 PM  [facility] [place], [place], [address] Phone: [phone]   Fax:  [phone]  Name: Edward Heath MRN: [id number] Date of Birth: [birth year]  Progress Note Reporting Period 05/18/22 to 06/22/21  See note below for Objective Data and Assessment of Progress/Goals.   Patient is making fair progress with skilled physical therapy as evidenced by his subjective reports. However, his five time sit to stand time increased slightly due to a LOB with completing this assessment. His goals will be  reassessed at an upcoming visit to determine if additional physical therapy would be beneficial or if he has experienced a plateau in progress. Recommend that he continues with his current plan of care to address his remaining impairments to maximize his household and community safety.   Jacqulynn Cadet, PT, DPT

## 2021-06-27 ENCOUNTER — Ambulatory Visit: Payer: Medicare Other

## 2021-06-27 ENCOUNTER — Other Ambulatory Visit: Payer: Self-pay

## 2021-06-27 DIAGNOSIS — Z9181 History of falling: Secondary | ICD-10-CM | POA: Diagnosis not present

## 2021-06-27 DIAGNOSIS — M6281 Muscle weakness (generalized): Secondary | ICD-10-CM

## 2021-06-27 DIAGNOSIS — R262 Difficulty in walking, not elsewhere classified: Secondary | ICD-10-CM

## 2021-06-27 NOTE — Therapy (Signed)
[facility] [place], [place], [address] Phone: [phone]   Fax:  [phone]  Physical Therapy Treatment  Patient Details  Name: Edward Heath MRN: [id number] Date of Birth: 1945-11-20 Referring Provider (PT): Stacks, MD   Encounter Date: 06/27/2021   PT End of Session - 06/27/21 1357     Visit Number 11    Number of Visits 12    Date for PT Re-Evaluation 08/16/21    PT Start Time 3094    PT Stop Time 0768    PT Time Calculation (min) 44 min    Activity Tolerance Patient tolerated treatment well    Behavior During Therapy [facility] for tasks assessed/performed             Past Medical History:  Diagnosis Date   AICD (automatic cardioverter/defibrillator) present    Arthritis    CHF (congestive heart failure) ([place])    Coronary artery disease    Diabetes mellitus without complication ([place])    Dysrhythmia    GERD (gastroesophageal reflux disease)    Heart attack ([place]) 2004, 2015   History of kidney stones    Hyperlipidemia    Hypertension    Hypothyroidism    Lumbar vertebral fracture ([place]) 2012   NSTEMI, initial episode of care ([place]) 10/09/2013   Skin cancer 06/2014   left cheek   Stroke ([place])    x3   Thyroid disease    TIA (transient ischemic attack) 2009    Past Surgical History:  Procedure Laterality Date   CARDIAC DEFIBRILLATOR PLACEMENT  12/2003, Repaired in 2012   x3   [place]  06/2014   had 4 removed   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   Procedure: LEFT ELBOW ULNAR NERVE DECOMPRESSION;  Surgeon: [name], MD;  Location: [place];  Service: Orthopedics;  Laterality: Left;    There were no vitals filed for this visit.   Subjective Assessment - 06/27/21 1356     Subjective Pt arrives for today's treatment session reporting 5/10 right lower extremity pain.  Pt states that he hasn't felt very good over the weekend with cold like symptoms.     Pertinent History HTN, CAD, PAD, history of stroke and TIA, diabetes, chronic low back    Limitations Walking;Standing    How long can you walk comfortably? only around his house without an assistive device, but will use a walker for short community distances or a scooter for long distances    Diagnostic tests xrays    Patient Stated Goals improve his balance, play golf by this spring    Currently in Pain? Yes    Pain Score 5     Pain Location Leg    Pain Orientation Right    Pain Onset More than a month ago                               [facility] Adult PT Treatment/Exercise - 06/27/21 0001       Knee/Hip Exercises: Aerobic   Nustep Lvl 5 x 20 mins      Knee/Hip Exercises: Seated   Long Arc Quad Strengthening;Both;Weights    Long Arc Quad Weight 3 lbs.    Long Arc Quad Limitations 3 mins    Other Seated Knee/Hip Exercises Heel Raises/Toe Raises 3# x 3 mins    Marching Strengthening;Both;Weights    Marching Limitations 3 min  Marching Weights 3 lbs.                 Balance Exercises - 06/27/21 0001       Balance Exercises: Standing   Standing Eyes Opened Narrow base of support (BOS);Solid surface;2 reps;30 secs    Standing Eyes Closed Narrow base of support (BOS);Solid surface;2 reps    Tandem Stance Eyes open;Intermittent upper extremity support;4 reps;30 secs   Alternating BLEs                    PT Long Term Goals - 06/22/21 1246       PT LONG TERM GOAL #1   Title Patient will be independent with his HEP    Time 6    Period Weeks    Status Achieved    Target Date 06/29/21      PT LONG TERM GOAL #2   Title Patient will be able to safely reach down to pick up an object from the floor.    Baseline 06/22/21: Pt able to pick up dropped object with left UE support and close sup.    Time 6    Period Weeks    Status On-going    Target Date 06/29/21      PT LONG TERM GOAL #3   Title Patient will be able to improve his 5x sit to  stand to at least 23 seconds or better.    Baseline 06/22/21: 36 seconds, with significant LOB x 1 and UE support    Time 6    Period Weeks    Status On-going    Target Date 06/29/21                   Plan - 06/27/21 1357     Clinical Impression Statement Pt arrives for today's treatment session reporting 5/10 right LE pain.  Pt instructed in standing balance activities to increase balance and safety.  Pt requiring close sup/CGA with all standing balance activities.  Pt with increased fatigue with standing balance activities requiring seated rest breaks.  Pt continues to be limited by lack of standing endurance and activity tolerance.  Pt reported 5/10 right LE pain at completion of today's treatment session.    Personal Factors and Comorbidities Comorbidity 1;Comorbidity 2;Comorbidity 3+;Time since onset of injury/illness/exacerbation    Comorbidities HTN, CAD, PAD, history of stroke and TIA, diabetes, chronic low back    Examination-Activity Limitations Locomotion Level;Transfers;Carry;Squat;Bend;Stand;Lift    Examination-Participation Restrictions Cleaning;Yard Work;Other    Stability/Clinical Decision Making Unstable/Unpredictable    Rehab Potential Fair    PT Frequency 2x / week    PT Duration 6 weeks    PT Treatment/Interventions ADLs/Self Care Home Management;Gait training;Stair training;Functional mobility training;Therapeutic activities;Therapeutic exercise;Balance training;Neuromuscular re-education;Patient/family education;Energy conservation    PT Next Visit Plan nustep, lower extremity strengthening    Consulted and Agree with Plan of Care Patient             Patient will benefit from skilled therapeutic intervention in order to improve the following deficits and impairments:  Abnormal gait, Difficulty walking, Decreased activity tolerance, Decreased balance, Impaired sensation, Decreased strength, Decreased mobility  Visit Diagnosis: Difficulty in walking, not  elsewhere classified  History of falling  Muscle weakness (generalized)     Problem List Patient Active Problem List   Diagnosis Date Noted   Unilateral primary osteoarthritis, right knee 08/26/2020   S/P fusion of thoracic spine 03/12/2020   S/P cubital tunnel release 09/05/2019   Seborrheic  keratoses 07/16/2019   Cubital tunnel syndrome on left 07/03/2019   Numbness and tingling in left hand 06/24/2019   Ulnar neuropathy of left upper extremity 06/24/2019   Absolute anemia 03/05/2018   S/P ICD (internal cardiac defibrillator) procedure 01/17/2018   Type 2 diabetes mellitus ([place]) 12/14/2015   Cardiomyopathy ([place]) 12/14/2015   Hyperlipidemia 12/14/2015   Squamous cell carcinoma of skin of left cheek 10/26/2015   Congestive heart failure ([place]) 10/09/2013   Action tremor 06/04/2013   Personal history of other malignant neoplasm of skin 11/14/2012   Ankylosis of spine 04/20/2011   Fracture of T6 vertebra ([place]) 04/20/2011   Hiatal hernia 09/02/2010   Barrett's esophagus 09/02/2010   Carotid stenosis, bilateral 09/02/2010   Hypertension 08/31/2010   CAD (coronary artery disease) 08/31/2010   PAD (peripheral artery disease) ([place]) 08/31/2010   DDD (degenerative disc disease), lumbar 08/31/2010   Chronic low back pain 08/31/2010   Actinic keratosis 08/31/2010   Melanoma ([place]) 08/31/2010   Anterior circulation transient ischemic attack 05/22/2009   Stroke ([place]) 10/25/2005    Kathrynn Ducking, PTA 06/27/2021, 2:35 PM  [facility] [place], [place], [address] Phone: [phone]   Fax:  [phone]  Name: DELON REVELO MRN: [id number] Date of Birth: 11-13-1945

## 2021-06-30 ENCOUNTER — Other Ambulatory Visit: Payer: Self-pay

## 2021-06-30 ENCOUNTER — Ambulatory Visit: Payer: Medicare Other

## 2021-06-30 DIAGNOSIS — M6281 Muscle weakness (generalized): Secondary | ICD-10-CM | POA: Diagnosis not present

## 2021-06-30 DIAGNOSIS — Z9181 History of falling: Secondary | ICD-10-CM | POA: Diagnosis not present

## 2021-06-30 DIAGNOSIS — R262 Difficulty in walking, not elsewhere classified: Secondary | ICD-10-CM | POA: Diagnosis not present

## 2021-06-30 NOTE — Addendum Note (Signed)
Addended by: Darlin Coco on: 06/30/2021 05:31 PM   Modules accepted: Orders

## 2021-06-30 NOTE — Therapy (Addendum)
[facility] [place], [place], [address] Phone: [phone]   Fax:  [phone]  Physical Therapy Treatment  Patient Details  Name: Edward Heath MRN: [id number] Date of Birth: June 30, 1945 Referring Provider (PT): Stacks, MD   Encounter Date: 06/30/2021   PT End of Session - 06/30/21 1310     Visit Number 12    Number of Visits 12    Date for PT Re-Evaluation 08/16/21    PT Start Time 1300    PT Stop Time 4854    PT Time Calculation (min) 45 min    Activity Tolerance Patient tolerated treatment well    Behavior During Therapy [facility] for tasks assessed/performed             Past Medical History:  Diagnosis Date   AICD (automatic cardioverter/defibrillator) present    Arthritis    CHF (congestive heart failure) ([place])    Coronary artery disease    Diabetes mellitus without complication ([place])    Dysrhythmia    GERD (gastroesophageal reflux disease)    Heart attack ([place]) 2004, 2015   History of kidney stones    Hyperlipidemia    Hypertension    Hypothyroidism    Lumbar vertebral fracture ([place]) 2012   NSTEMI, initial episode of care ([place]) 10/09/2013   Skin cancer 06/2014   left cheek   Stroke ([place])    x3   Thyroid disease    TIA (transient ischemic attack) 2009    Past Surgical History:  Procedure Laterality Date   CARDIAC DEFIBRILLATOR PLACEMENT  12/2003, Repaired in 2012   x3   [place]  06/2014   had 4 removed   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   Procedure: LEFT ELBOW ULNAR NERVE DECOMPRESSION;  Surgeon: [name], MD;  Location: [place];  Service: Orthopedics;  Laterality: Left;    There were no vitals filed for this visit.   Subjective Assessment - 06/30/21 1310     Subjective Pt arrives for today's treatment session reporting that he feels about the "same as usual" without any new complaints.    Pertinent History HTN, CAD, PAD, history of stroke and  TIA, diabetes, chronic low back    Limitations Walking;Standing    How long can you walk comfortably? only around his house without an assistive device, but will use a walker for short community distances or a scooter for long distances    Diagnostic tests xrays    Patient Stated Goals improve his balance, play golf by this spring    Currently in Pain? Yes    Pain Score 5     Pain Location Leg    Pain Orientation Right    Pain Onset More than a month ago                               [facility] Adult PT Treatment/Exercise - 06/30/21 0001       Knee/Hip Exercises: Aerobic   Nustep Lvl 5 x 20 mins      Knee/Hip Exercises: Seated   Long Arc Quad Strengthening;Both;Weights    Long Arc Quad Weight 3 lbs.    Long Arc Quad Limitations 3 mins    Ball Squeeze 3 mins    Clamshell with TheraBand Red   2 mins   Marching Strengthening;Both;Weights    Marching Limitations 3 min    Marching Weights 3 lbs.  Hamstring Curl Strengthening;Both;20 reps    Hamstring Limitations Red tband    Sit to Sand 10 reps;with UE support;5 reps;without UE support   5 reps without UE support from elevated surface     Knee/Hip Exercises: Supine   Quad Sets PROM                          PT Long Term Goals - 06/30/21 1311       PT LONG TERM GOAL #1   Title Patient will be independent with his HEP    Time 6    Period Weeks    Status Achieved    Target Date 06/29/21      PT LONG TERM GOAL #2   Title Patient will be able to safely reach down to pick up an object from the floor.    Baseline 06/22/21: Pt able to pick up dropped object with left UE support and close sup. 06/30/21: able to pick up object from floor with UE support    Time 6    Period Weeks    Status Partially Met    Target Date 06/29/21      PT LONG TERM GOAL #3   Title Patient will be able to improve his 5x sit to stand to at least 23 seconds or better.    Baseline 06/22/21: 36 seconds, with significant LOB x  1 and UE support; 06/30/21: 15 secs with BUE support    Time 6    Period Weeks    Status On-going    Target Date 06/29/21                   Plan - 06/30/21 1310     Clinical Impression Statement Pt arrives for today's treatment session reporting 5/10 right LE pain.  During treatment pt's blood sugar read 326.  Pt was prepared and was able to administer insulin.  Pt able to demonstrate the ability to pick up object from the floor with UE support safely. Pt able to perform 5 STS with BUE support in 15 secs, but was unable to perform 5 STSs without UE support.  Pt would benefit from continuation of current POC to increase safety, function, activity tolerance, and balance.    Personal Factors and Comorbidities Comorbidity 1;Comorbidity 2;Comorbidity 3+;Time since onset of injury/illness/exacerbation    Comorbidities HTN, CAD, PAD, history of stroke and TIA, diabetes, chronic low back    Examination-Activity Limitations Locomotion Level;Transfers;Carry;Squat;Bend;Stand;Lift    Examination-Participation Restrictions Cleaning;Yard Work;Other    Stability/Clinical Decision Making Unstable/Unpredictable    Rehab Potential Fair    PT Frequency 2x / week    PT Duration 6 weeks    PT Treatment/Interventions ADLs/Self Care Home Management;Gait training;Stair training;Functional mobility training;Therapeutic activities;Therapeutic exercise;Balance training;Neuromuscular re-education;Patient/family education;Energy conservation    PT Next Visit Plan nustep, lower extremity strengthening    Consulted and Agree with Plan of Care Patient             Patient will benefit from skilled therapeutic intervention in order to improve the following deficits and impairments:  Abnormal gait, Difficulty walking, Decreased activity tolerance, Decreased balance, Impaired sensation, Decreased strength, Decreased mobility  Visit Diagnosis: Difficulty in walking, not elsewhere classified  History of  falling  Muscle weakness (generalized)     Problem List Patient Active Problem List   Diagnosis Date Noted   Unilateral primary osteoarthritis, right knee 08/26/2020   S/P fusion of thoracic spine 03/12/2020   S/P  cubital tunnel release 09/05/2019   Seborrheic keratoses 07/16/2019   Cubital tunnel syndrome on left 07/03/2019   Numbness and tingling in left hand 06/24/2019   Ulnar neuropathy of left upper extremity 06/24/2019   Absolute anemia 03/05/2018   S/P ICD (internal cardiac defibrillator) procedure 01/17/2018   Type 2 diabetes mellitus ([place]) 12/14/2015   Cardiomyopathy ([place]) 12/14/2015   Hyperlipidemia 12/14/2015   Squamous cell carcinoma of skin of left cheek 10/26/2015   Congestive heart failure ([place]) 10/09/2013   Action tremor 06/04/2013   Personal history of other malignant neoplasm of skin 11/14/2012   Ankylosis of spine 04/20/2011   Fracture of T6 vertebra ([place]) 04/20/2011   Hiatal hernia 09/02/2010   Barrett's esophagus 09/02/2010   Carotid stenosis, bilateral 09/02/2010   Hypertension 08/31/2010   CAD (coronary artery disease) 08/31/2010   PAD (peripheral artery disease) ([place]) 08/31/2010   DDD (degenerative disc disease), lumbar 08/31/2010   Chronic low back pain 08/31/2010   Actinic keratosis 08/31/2010   Melanoma ([place]) 08/31/2010   Anterior circulation transient ischemic attack 05/22/2009   Stroke ([place]) 10/25/2005    Kathrynn Ducking, PTA 06/30/2021, 1:52 PM  [facility] [place], [place], [address] Phone: [phone]   Fax:  [phone]  Name: SAMAAD HASHEM MRN: [id number] Date of Birth: 06-27-1945

## 2021-07-04 ENCOUNTER — Other Ambulatory Visit: Payer: Self-pay

## 2021-07-04 ENCOUNTER — Ambulatory Visit: Payer: Medicare Other

## 2021-07-04 DIAGNOSIS — R262 Difficulty in walking, not elsewhere classified: Secondary | ICD-10-CM | POA: Diagnosis not present

## 2021-07-04 DIAGNOSIS — Z9181 History of falling: Secondary | ICD-10-CM | POA: Diagnosis not present

## 2021-07-04 DIAGNOSIS — M6281 Muscle weakness (generalized): Secondary | ICD-10-CM | POA: Diagnosis not present

## 2021-07-04 NOTE — Therapy (Signed)
[facility] [place], [place], [address] Phone: [phone]   Fax:  [phone]  Physical Therapy Treatment  Patient Details  Name: Edward Heath MRN: [id number] Date of Birth: 08/04/1945 Referring Provider (PT): Stacks, MD   Encounter Date: 07/04/2021   PT End of Session - 07/04/21 1253     Visit Number 13    Number of Visits 16    Date for PT Re-Evaluation 08/16/21    PT Start Time 66    PT Stop Time 8921    PT Time Calculation (min) 46 min    Activity Tolerance Patient tolerated treatment well    Behavior During Therapy [facility] for tasks assessed/performed             Past Medical History:  Diagnosis Date   AICD (automatic cardioverter/defibrillator) present    Arthritis    CHF (congestive heart failure) ([place])    Coronary artery disease    Diabetes mellitus without complication ([place])    Dysrhythmia    GERD (gastroesophageal reflux disease)    Heart attack ([place]) 2004, 2015   History of kidney stones    Hyperlipidemia    Hypertension    Hypothyroidism    Lumbar vertebral fracture ([place]) 2012   NSTEMI, initial episode of care ([place]) 10/09/2013   Skin cancer 06/2014   left cheek   Stroke ([place])    x3   Thyroid disease    TIA (transient ischemic attack) 2009    Past Surgical History:  Procedure Laterality Date   CARDIAC DEFIBRILLATOR PLACEMENT  12/2003, Repaired in 2012   x3   [place]  06/2014   had 4 removed   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   Procedure: LEFT ELBOW ULNAR NERVE DECOMPRESSION;  Surgeon: [name], MD;  Location: [place];  Service: Orthopedics;  Laterality: Left;    There were no vitals filed for this visit.   Subjective Assessment - 07/04/21 1252     Subjective Pt arrives for today's treatment session reporting 5/10 right LE pain.    Pertinent History HTN, CAD, PAD, history of stroke and TIA, diabetes, chronic low back    Limitations  Walking;Standing    How long can you walk comfortably? only around his house without an assistive device, but will use a walker for short community distances or a scooter for long distances    Diagnostic tests xrays    Patient Stated Goals improve his balance, play golf by this spring    Currently in Pain? Yes    Pain Score 5     Pain Location Leg    Pain Orientation Right    Pain Onset More than a month ago                               [facility] Adult PT Treatment/Exercise - 07/04/21 0001       Knee/Hip Exercises: Aerobic   Nustep Lvl 5 x 20 mins      Knee/Hip Exercises: Seated   Long Arc Quad Strengthening;Both;Weights    Long Arc Quad Weight 4 lbs.    Long Arc Quad Limitations 2 mins    Ball Squeeze 3 mins    Clamshell with TheraBand Red   20 reps   Other Seated Knee/Hip Exercises Heel Raises/Toe Raises 4# x 3 mins    Marching Strengthening;Both;Weights    Marching Limitations 2 min  Marching Weights 4 lbs.    Hamstring Curl Strengthening;Both;20 reps    Hamstring Limitations red tband                 Balance Exercises - 07/04/21 0001       Balance Exercises: Standing   Standing Eyes Opened Narrow base of support (BOS);2 reps   1 min   Tandem Stance Eyes open;Intermittent upper extremity support;4 reps;30 secs   Alternating LE                    PT Long Term Goals - 06/30/21 1311       PT LONG TERM GOAL #1   Title Patient will be independent with his HEP    Time 6    Period Weeks    Status Achieved    Target Date 06/29/21      PT LONG TERM GOAL #2   Title Patient will be able to safely reach down to pick up an object from the floor.    Baseline 06/22/21: Pt able to pick up dropped object with left UE support and close sup. 06/30/21: able to pick up object from floor with UE support    Time 6    Period Weeks    Status Partially Met    Target Date 06/29/21      PT LONG TERM GOAL #3   Title Patient will be able to improve  his 5x sit to stand to at least 23 seconds or better.    Baseline 06/22/21: 36 seconds, with significant LOB x 1 and UE support; 06/30/21: 15 secs with BUE support    Time 6    Period Weeks    Status On-going    Target Date 06/29/21                   Plan - 07/04/21 1253     Clinical Impression Statement Pt arrives for today's treatment session reporting 5/10 right LE pain.  Pt states that he can tell that his legs are getting stronger, but there is no difference in leg pain.  Pt able to tolerate increase in resistance with seated BLE exercises, requiring min cues to perform full ROM with all reps.  Pt demonstrating slight decrease in instability during tandem stance.  Pt reported 4/10 R LE pain at completion of today's treatment session.    Personal Factors and Comorbidities Comorbidity 1;Comorbidity 2;Comorbidity 3+;Time since onset of injury/illness/exacerbation    Comorbidities HTN, CAD, PAD, history of stroke and TIA, diabetes, chronic low back    Examination-Activity Limitations Locomotion Level;Transfers;Carry;Squat;Bend;Stand;Lift    Examination-Participation Restrictions Cleaning;Yard Work;Other    Stability/Clinical Decision Making Unstable/Unpredictable    Rehab Potential Fair    PT Frequency 2x / week    PT Duration 6 weeks    PT Treatment/Interventions ADLs/Self Care Home Management;Gait training;Stair training;Functional mobility training;Therapeutic activities;Therapeutic exercise;Balance training;Neuromuscular re-education;Patient/family education;Energy conservation    PT Next Visit Plan nustep, lower extremity strengthening    Consulted and Agree with Plan of Care Patient             Patient will benefit from skilled therapeutic intervention in order to improve the following deficits and impairments:  Abnormal gait, Difficulty walking, Decreased activity tolerance, Decreased balance, Impaired sensation, Decreased strength, Decreased mobility  Visit  Diagnosis: History of falling  Difficulty in walking, not elsewhere classified  Muscle weakness (generalized)     Problem List Patient Active Problem List   Diagnosis Date Noted  Unilateral primary osteoarthritis, right knee 08/26/2020   S/P fusion of thoracic spine 03/12/2020   S/P cubital tunnel release 09/05/2019   Seborrheic keratoses 07/16/2019   Cubital tunnel syndrome on left 07/03/2019   Numbness and tingling in left hand 06/24/2019   Ulnar neuropathy of left upper extremity 06/24/2019   Absolute anemia 03/05/2018   S/P ICD (internal cardiac defibrillator) procedure 01/17/2018   Type 2 diabetes mellitus ([place]) 12/14/2015   Cardiomyopathy ([place]) 12/14/2015   Hyperlipidemia 12/14/2015   Squamous cell carcinoma of skin of left cheek 10/26/2015   Congestive heart failure ([place]) 10/09/2013   Action tremor 06/04/2013   Personal history of other malignant neoplasm of skin 11/14/2012   Ankylosis of spine 04/20/2011   Fracture of T6 vertebra ([place]) 04/20/2011   Hiatal hernia 09/02/2010   Barrett's esophagus 09/02/2010   Carotid stenosis, bilateral 09/02/2010   Hypertension 08/31/2010   CAD (coronary artery disease) 08/31/2010   PAD (peripheral artery disease) ([place]) 08/31/2010   DDD (degenerative disc disease), lumbar 08/31/2010   Chronic low back pain 08/31/2010   Actinic keratosis 08/31/2010   Melanoma ([place]) 08/31/2010   Anterior circulation transient ischemic attack 05/22/2009   Stroke ([place]) 10/25/2005    Kathrynn Ducking, PTA 07/04/2021, 1:52 PM  [facility] [place], [place], [address] Phone: [phone]   Fax:  [phone]  Name: Edward Heath MRN: [id number] Date of Birth: 1945/10/24

## 2021-07-06 ENCOUNTER — Other Ambulatory Visit: Payer: Self-pay

## 2021-07-06 ENCOUNTER — Ambulatory Visit: Payer: Medicare Other

## 2021-07-06 DIAGNOSIS — R262 Difficulty in walking, not elsewhere classified: Secondary | ICD-10-CM

## 2021-07-06 DIAGNOSIS — Z9181 History of falling: Secondary | ICD-10-CM

## 2021-07-06 DIAGNOSIS — M6281 Muscle weakness (generalized): Secondary | ICD-10-CM

## 2021-07-06 NOTE — Therapy (Signed)
[facility] [place], [place], [address] Phone: [phone]   Fax:  [phone]  Physical Therapy Treatment  Patient Details  Name: Edward Heath MRN: [id number] Date of Birth: Jul 30, 1945 Referring Provider (PT): Stacks, MD   Encounter Date: 07/06/2021   PT End of Session - 07/06/21 1245     Visit Number 14    Number of Visits 16    Date for PT Re-Evaluation 08/16/21    PT Start Time 1243    PT Stop Time 1330    PT Time Calculation (min) 47 min    Activity Tolerance Patient tolerated treatment well    Behavior During Therapy [facility] for tasks assessed/performed             Past Medical History:  Diagnosis Date   AICD (automatic cardioverter/defibrillator) present    Arthritis    CHF (congestive heart failure) ([place])    Coronary artery disease    Diabetes mellitus without complication ([place])    Dysrhythmia    GERD (gastroesophageal reflux disease)    Heart attack ([place]) 2004, 2015   History of kidney stones    Hyperlipidemia    Hypertension    Hypothyroidism    Lumbar vertebral fracture ([place]) 2012   NSTEMI, initial episode of care ([place]) 10/09/2013   Skin cancer 06/2014   left cheek   Stroke ([place])    x3   Thyroid disease    TIA (transient ischemic attack) 2009    Past Surgical History:  Procedure Laterality Date   CARDIAC DEFIBRILLATOR PLACEMENT  12/2003, Repaired in 2012   x3   [place]  06/2014   had 4 removed   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   Procedure: LEFT ELBOW ULNAR NERVE DECOMPRESSION;  Surgeon: [name], MD;  Location: [place];  Service: Orthopedics;  Laterality: Left;    There were no vitals filed for this visit.   Subjective Assessment - 07/06/21 1245     Subjective Pt arrives for today's treatment session denying any change in pain and symptoms.    Pertinent History HTN, CAD, PAD, history of stroke and TIA, diabetes, chronic low back     Limitations Walking;Standing    How long can you walk comfortably? only around his house without an assistive device, but will use a walker for short community distances or a scooter for long distances    Diagnostic tests xrays    Patient Stated Goals improve his balance, play golf by this spring    Currently in Pain? Yes    Pain Score 5     Pain Location Leg    Pain Orientation Right    Pain Onset More than a month ago                               [facility] Adult PT Treatment/Exercise - 07/06/21 0001       Exercises   Exercises Knee/Hip;Lumbar      Lumbar Exercises: Machines for Strengthening   Cybex Lumbar Extension 60#  2 mins    Other Lumbar Machine Exercise lumbar flexion 60# 2 mins      Knee/Hip Exercises: Aerobic   Nustep Lvl 5 x 18 mins      Knee/Hip Exercises: Seated   Sit to Sand 10 reps;with UE support   10 reps from elevated surface without UE support  Balance Exercises - 07/06/21 0001       Balance Exercises: Standing   Tandem Stance Eyes open;Intermittent upper extremity support;4 reps;30 secs    Sidestepping Foam/compliant support;5 reps   Down and back, single UE support   Marching Solid surface;Upper extremity assist 1;20 reps    Heel Raises Both;20 reps;Other (comment)   single UE support   Toe Raise Both;20 reps   Single UE support   Other Standing Exercises Practice golf swing x 10 reps   close sup, no LOB                    PT Long Term Goals - 06/30/21 1311       PT LONG TERM GOAL #1   Title Patient will be independent with his HEP    Time 6    Period Weeks    Status Achieved    Target Date 06/29/21      PT LONG TERM GOAL #2   Title Patient will be able to safely reach down to pick up an object from the floor.    Baseline 06/22/21: Pt able to pick up dropped object with left UE support and close sup. 06/30/21: able to pick up object from floor with UE support    Time 6    Period Weeks     Status Partially Met    Target Date 06/29/21      PT LONG TERM GOAL #3   Title Patient will be able to improve his 5x sit to stand to at least 23 seconds or better.    Baseline 06/22/21: 36 seconds, with significant LOB x 1 and UE support; 06/30/21: 15 secs with BUE support    Time 6    Period Weeks    Status On-going    Target Date 06/29/21                   Plan - 07/06/21 1246     Clinical Impression Statement Pt arrives for today's treatment session reporting conintued 5/10 right LE pain.  Pt states that he has a neurology appointment on Friday and encouraged to mention his ongoing LE pain.  Pt able to tolerate addition of standing balance exercises at the sink, requiring close sup/CGA with all activities.  Pt able to perform practice golf swings in the gym in order to test balance and ability to visit the golf course tomorrow.  Pt able to perform all swings without LOB, but unsteadiness noted.  Pt educated on importance of safety when ambulating on uneven surface and to not over exert himself.  Pt reported 5/10 right LE pain, but very pleased with today's treatment session.    Personal Factors and Comorbidities Comorbidity 1;Comorbidity 2;Comorbidity 3+;Time since onset of injury/illness/exacerbation    Comorbidities HTN, CAD, PAD, history of stroke and TIA, diabetes, chronic low back    Examination-Activity Limitations Locomotion Level;Transfers;Carry;Squat;Bend;Stand;Lift    Examination-Participation Restrictions Cleaning;Yard Work;Other    Stability/Clinical Decision Making Unstable/Unpredictable    Rehab Potential Fair    PT Frequency 2x / week    PT Duration 6 weeks    PT Treatment/Interventions ADLs/Self Care Home Management;Gait training;Stair training;Functional mobility training;Therapeutic activities;Therapeutic exercise;Balance training;Neuromuscular re-education;Patient/family education;Energy conservation    PT Next Visit Plan nustep, lower extremity strengthening     Consulted and Agree with Plan of Care Patient             Patient will benefit from skilled therapeutic intervention in order to improve the following  deficits and impairments:  Abnormal gait, Difficulty walking, Decreased activity tolerance, Decreased balance, Impaired sensation, Decreased strength, Decreased mobility  Visit Diagnosis: History of falling  Difficulty in walking, not elsewhere classified  Muscle weakness (generalized)     Problem List Patient Active Problem List   Diagnosis Date Noted   Unilateral primary osteoarthritis, right knee 08/26/2020   S/P fusion of thoracic spine 03/12/2020   S/P cubital tunnel release 09/05/2019   Seborrheic keratoses 07/16/2019   Cubital tunnel syndrome on left 07/03/2019   Numbness and tingling in left hand 06/24/2019   Ulnar neuropathy of left upper extremity 06/24/2019   Absolute anemia 03/05/2018   S/P ICD (internal cardiac defibrillator) procedure 01/17/2018   Type 2 diabetes mellitus ([place]) 12/14/2015   Cardiomyopathy ([place]) 12/14/2015   Hyperlipidemia 12/14/2015   Squamous cell carcinoma of skin of left cheek 10/26/2015   Congestive heart failure ([place]) 10/09/2013   Action tremor 06/04/2013   Personal history of other malignant neoplasm of skin 11/14/2012   Ankylosis of spine 04/20/2011   Fracture of T6 vertebra ([place]) 04/20/2011   Hiatal hernia 09/02/2010   Barrett's esophagus 09/02/2010   Carotid stenosis, bilateral 09/02/2010   Hypertension 08/31/2010   CAD (coronary artery disease) 08/31/2010   PAD (peripheral artery disease) ([place]) 08/31/2010   DDD (degenerative disc disease), lumbar 08/31/2010   Chronic low back pain 08/31/2010   Actinic keratosis 08/31/2010   Melanoma ([place]) 08/31/2010   Anterior circulation transient ischemic attack 05/22/2009   Stroke ([place]) 10/25/2005    Kathrynn Ducking, PTA 07/06/2021, 1:38 PM  [facility] [place], [place],  [address] Phone: [phone]   Fax:  [phone]  Name: Edward Heath MRN: [id number] Date of Birth: Jun 15, 1945

## 2021-07-13 ENCOUNTER — Other Ambulatory Visit: Payer: Self-pay

## 2021-07-13 ENCOUNTER — Ambulatory Visit: Payer: Medicare Other

## 2021-07-13 DIAGNOSIS — R262 Difficulty in walking, not elsewhere classified: Secondary | ICD-10-CM | POA: Diagnosis not present

## 2021-07-13 DIAGNOSIS — Z9181 History of falling: Secondary | ICD-10-CM | POA: Diagnosis not present

## 2021-07-13 DIAGNOSIS — M6281 Muscle weakness (generalized): Secondary | ICD-10-CM | POA: Diagnosis not present

## 2021-07-13 NOTE — Therapy (Signed)
[facility] [place], [place], [address] Phone: [phone]   Fax:  [phone]  Physical Therapy Treatment  Patient Details  Name: Edward Heath MRN: [id number] Date of Birth: 07/06/1945 Referring Provider (PT): Stacks, MD   Encounter Date: 07/13/2021   PT End of Session - 07/13/21 1254     Visit Number 15    Number of Visits 16    Date for PT Re-Evaluation 08/16/21    PT Start Time 1300    PT Stop Time 9924    PT Time Calculation (min) 47 min    Activity Tolerance Patient tolerated treatment well    Behavior During Therapy [facility] for tasks assessed/performed             Past Medical History:  Diagnosis Date   AICD (automatic cardioverter/defibrillator) present    Arthritis    CHF (congestive heart failure) ([place])    Coronary artery disease    Diabetes mellitus without complication ([place])    Dysrhythmia    GERD (gastroesophageal reflux disease)    Heart attack ([place]) 2004, 2015   History of kidney stones    Hyperlipidemia    Hypertension    Hypothyroidism    Lumbar vertebral fracture ([place]) 2012   NSTEMI, initial episode of care ([place]) 10/09/2013   Skin cancer 06/2014   left cheek   Stroke ([place])    x3   Thyroid disease    TIA (transient ischemic attack) 2009    Past Surgical History:  Procedure Laterality Date   CARDIAC DEFIBRILLATOR PLACEMENT  12/2003, Repaired in 2012   x3   [place]  06/2014   had 4 removed   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   Procedure: LEFT ELBOW ULNAR NERVE DECOMPRESSION;  Surgeon: [name], MD;  Location: [place];  Service: Orthopedics;  Laterality: Left;    There were no vitals filed for this visit.   Subjective Assessment - 07/13/21 1253     Subjective Pt arrives for today's treatment session denying any change in pain and symptoms.  Pt states that he has played golf twice since last treatment session and was able to play  well.  Pt reports that he went to the neurologist last week and he is "tickled."    Pertinent History HTN, CAD, PAD, history of stroke and TIA, diabetes, chronic low back    Limitations Walking;Standing    How long can you walk comfortably? only around his house without an assistive device, but will use a walker for short community distances or a scooter for long distances    Diagnostic tests xrays    Patient Stated Goals improve his balance, play golf by this spring    Pain Onset More than a month ago                               [facility] Adult PT Treatment/Exercise - 07/13/21 0001       Lumbar Exercises: Machines for Strengthening   Cybex Lumbar Extension 70# x 2 mins    Other Lumbar Machine Exercise lumbar flexion 70# 2 mins      Knee/Hip Exercises: Aerobic   Nustep Lvl 5 x 20 mins      Knee/Hip Exercises: Seated   Long Arc Quad Strengthening;Both;Weights    Long Arc Quad Weight 4 lbs.    Long [employer] Limitations 2 mins  Marching Strengthening;Both;Weights    Marching Limitations 2 min    Marching Weights 4 lbs.                 Balance Exercises - 07/13/21 0001       Balance Exercises: Standing   Tandem Gait Forward;Retro;Upper extremity support;5 reps    Sidestepping Foam/compliant support;5 reps                     PT Long Term Goals - 06/30/21 1311       PT LONG TERM GOAL #1   Title Patient will be independent with his HEP    Time 6    Period Weeks    Status Achieved    Target Date 06/29/21      PT LONG TERM GOAL #2   Title Patient will be able to safely reach down to pick up an object from the floor.    Baseline 06/22/21: Pt able to pick up dropped object with left UE support and close sup. 06/30/21: able to pick up object from floor with UE support    Time 6    Period Weeks    Status Partially Met    Target Date 06/29/21      PT LONG TERM GOAL #3   Title Patient will be able to improve his 5x sit to stand to at least  23 seconds or better.    Baseline 06/22/21: 36 seconds, with significant LOB x 1 and UE support; 06/30/21: 15 secs with BUE support    Time 6    Period Weeks    Status On-going    Target Date 06/29/21                   Plan - 07/13/21 1254     Clinical Impression Statement Pt arrives for today's treatment session denying any pain.  Pt states that he has played golf two times since his last session and plans to play again tomorrow.  Pt reports that he did very well.  Pt able to tolerate increased resistance with lumbar flexion and extension.  Pt demonstrates decreased instability with various balance exercises during today's treatment session.    Personal Factors and Comorbidities Comorbidity 1;Comorbidity 2;Comorbidity 3+;Time since onset of injury/illness/exacerbation    Comorbidities HTN, CAD, PAD, history of stroke and TIA, diabetes, chronic low back    Examination-Activity Limitations Locomotion Level;Transfers;Carry;Squat;Bend;Stand;Lift    Examination-Participation Restrictions Cleaning;Yard Work;Other    Stability/Clinical Decision Making Unstable/Unpredictable    Rehab Potential Fair    PT Frequency 2x / week    PT Duration 6 weeks    PT Treatment/Interventions ADLs/Self Care Home Management;Gait training;Stair training;Functional mobility training;Therapeutic activities;Therapeutic exercise;Balance training;Neuromuscular re-education;Patient/family education;Energy conservation    PT Next Visit Plan nustep, lower extremity strengthening    Consulted and Agree with Plan of Care Patient             Patient will benefit from skilled therapeutic intervention in order to improve the following deficits and impairments:  Abnormal gait, Difficulty walking, Decreased activity tolerance, Decreased balance, Impaired sensation, Decreased strength, Decreased mobility  Visit Diagnosis: History of falling  Difficulty in walking, not elsewhere classified  Muscle weakness  (generalized)     Problem List Patient Active Problem List   Diagnosis Date Noted   Unilateral primary osteoarthritis, right knee 08/26/2020   S/P fusion of thoracic spine 03/12/2020   S/P cubital tunnel release 09/05/2019   Seborrheic keratoses 07/16/2019   Cubital tunnel syndrome  on left 07/03/2019   Numbness and tingling in left hand 06/24/2019   Ulnar neuropathy of left upper extremity 06/24/2019   Absolute anemia 03/05/2018   S/P ICD (internal cardiac defibrillator) procedure 01/17/2018   Type 2 diabetes mellitus ([place]) 12/14/2015   Cardiomyopathy ([place]) 12/14/2015   Hyperlipidemia 12/14/2015   Squamous cell carcinoma of skin of left cheek 10/26/2015   Congestive heart failure ([place]) 10/09/2013   Action tremor 06/04/2013   Personal history of other malignant neoplasm of skin 11/14/2012   Ankylosis of spine 04/20/2011   Fracture of T6 vertebra ([place]) 04/20/2011   Hiatal hernia 09/02/2010   Barrett's esophagus 09/02/2010   Carotid stenosis, bilateral 09/02/2010   Hypertension 08/31/2010   CAD (coronary artery disease) 08/31/2010   PAD (peripheral artery disease) ([place]) 08/31/2010   DDD (degenerative disc disease), lumbar 08/31/2010   Chronic low back pain 08/31/2010   Actinic keratosis 08/31/2010   Melanoma ([place]) 08/31/2010   Anterior circulation transient ischemic attack 05/22/2009   Stroke ([place]) 10/25/2005    Kathrynn Ducking, PTA 07/13/2021, 2:01 PM  [facility] [place], [place], [address] Phone: [phone]   Fax:  [phone]  Name: NAYTHAN DOUTHIT MRN: [id number] Date of Birth: 19-May-1946

## 2021-07-20 ENCOUNTER — Ambulatory Visit: Payer: Medicare Other | Attending: Orthopedic Surgery

## 2021-07-20 ENCOUNTER — Other Ambulatory Visit: Payer: Self-pay

## 2021-07-20 DIAGNOSIS — M6281 Muscle weakness (generalized): Secondary | ICD-10-CM | POA: Diagnosis present

## 2021-07-20 DIAGNOSIS — Z9181 History of falling: Secondary | ICD-10-CM | POA: Diagnosis not present

## 2021-07-20 DIAGNOSIS — R262 Difficulty in walking, not elsewhere classified: Secondary | ICD-10-CM | POA: Diagnosis present

## 2021-07-20 NOTE — Therapy (Addendum)
[facility] ?[place], [place], [address] ?Phone: [phone]   Fax:  [phone] ? ?Physical Therapy Treatment ? ?Patient Details  ?Name: Edward Heath ?MRN: [id number] ?Date of Birth: [birth year] ?Referring Provider (PT): Stacks, MD ? ? ?Encounter Date: 07/20/2021 ? ? PT End of Session - 07/20/21 1256   ? ? Visit Number 16   ? Number of Visits 16   ? Date for PT Re-Evaluation 08/16/21   ? PT Start Time 1300   ? PT Stop Time 1350   ? PT Time Calculation (min) 50 min   ? Activity Tolerance Patient tolerated treatment well   ? Behavior During Therapy [facility] for tasks assessed/performed   ? ?  ?  ? ?  ? ? ?Past Medical History:  ?Diagnosis Date  ? AICD (automatic cardioverter/defibrillator) present   ? Arthritis   ? CHF (congestive heart failure) ([place])   ? Coronary artery disease   ? Diabetes mellitus without complication ([place])   ? Dysrhythmia   ? GERD (gastroesophageal reflux disease)   ? Heart attack ([place]) 2004, 2015  ? History of kidney stones   ? Hyperlipidemia   ? Hypertension   ? Hypothyroidism   ? Lumbar vertebral fracture ([place]) 2012  ? NSTEMI, initial episode of care [facility]) 10/09/2013  ? Skin cancer 06/2014  ? left cheek  ? Stroke [facility])   ? x3  ? Thyroid disease   ? TIA (transient ischemic attack) 2009  ? ? ?Past Surgical History:  ?Procedure Laterality Date  ? CARDIAC DEFIBRILLATOR PLACEMENT  12/2003, Repaired in 2012  ? x3  ? KNEE SURGERY Right 1994  ? SKIN CANCER EXCISION  06/2014  ? had 4 removed  ? 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  ? Procedure: LEFT ELBOW ULNAR NERVE DECOMPRESSION;  Surgeon: [name], MD;  Location: [place];  Service: Orthopedics;  Laterality: Left;  ? ? ?There were no vitals filed for this visit. ? ? Subjective Assessment - 07/20/21 1255   ? ? Subjective Pt arrives for today's treatment session denying any changes in pain or symptoms.  Pt has continued to play golf on Tuesdays and Thursdays without issue.   ? Pertinent History  HTN, CAD, PAD, history of stroke and TIA, diabetes, chronic low back   ? Limitations Walking;Standing   ? How long can you walk comfortably? only around his house without an assistive device, but will use a walker for short community distances or a scooter for long distances   ? Diagnostic tests xrays   ? Patient Stated Goals improve his balance, play golf by this spring   ? Currently in Pain? Yes   ? Pain Score 5    ? Pain Location Leg   ? Pain Orientation Right   ? Pain Onset More than a month ago   ? ?  ?  ? ?  ? ? ? ? ? ? ? ? ? ? ? ? ? ? ? ? ? ? ? ? [place] Adult PT Treatment/Exercise - 07/20/21 0001   ? ?  ? Therapeutic Activites   ? Therapeutic Activities Other Therapeutic Activities   ? Other Therapeutic Activities Practicing putting in facility to simulate real life activities, activity tolerance, and challenge balance   ?  ? Lumbar Exercises: Machines for Strengthening  ? Cybex Lumbar Extension 100# x 2 mins   ? Other Lumbar Machine Exercise lumbar flexion 100# x 2 mins   ?  ? Knee/Hip Exercises: Aerobic  ?  Nustep Lvl 5 x 15 mins   ?  ? Knee/Hip Exercises: Seated  ? Sit to Sand 10 reps;with UE support   ? ?  ?  ? ?  ? ? ? ? ? ? ? ? ? ? ? ? ? ? ? PT Long Term Goals - 07/20/21 1257   ? ?  ? PT LONG TERM GOAL #1  ? Title Patient will be independent with his HEP   ? Time 6   ? Period Weeks   ? Status Achieved   ? Target Date 06/29/21   ?  ? PT LONG TERM GOAL #2  ? Title Patient will be able to safely reach down to pick up an object from the floor.   ? Baseline 06/22/21: Pt able to pick up dropped object with left UE support and close sup. 06/30/21: able to pick up object from floor with UE support; 07/20/21: able to demonstrate   ? Time 6   ? Period Weeks   ? Status Achieved   ? Target Date 06/29/21   ?  ? PT LONG TERM GOAL #3  ? Title Patient will be able to improve his 5x sit to stand to at least 23 seconds or better.   ? Baseline 06/22/21: 36 seconds, with significant LOB x 1 and UE support; 06/30/21: 15 secs with BUE  support; 07/20/21: 16 secs from elevated seat without UE support   ? Time 6   ? Period Weeks   ? Status Achieved   ? Target Date 06/29/21   ? ?  ?  ? ?  ? ? ? ? ? ? ? ? Plan - 07/20/21 1256   ? ? Clinical Impression Statement Pt arrives for today's treatment session reporting 5/10 right LE pain, which he states is "no different than usual."  Pt has met or partially met all of his goals at this time.  Pt is able to play golf two days a week, weather allowing.  Pt able to increase resistance with lumbar extension and flexion.  Pt able to perform numerous practice putts to challenge dynamic stability within and out of BOS.  Pt pleased with progress and instructed to call facility with any questions or concerns in the future.   ? Personal Factors and Comorbidities Comorbidity 1;Comorbidity 2;Comorbidity 3+;Time since onset of injury/illness/exacerbation   ? Comorbidities HTN, CAD, PAD, history of stroke and TIA, diabetes, chronic low back   ? Examination-Activity Limitations Locomotion Level;Transfers;Carry;Squat;Bend;Stand;Lift   ? Examination-Participation Restrictions Cleaning;Yard Work;Other   ? Stability/Clinical Decision Making Unstable/Unpredictable   ? Rehab Potential Fair   ? PT Frequency 2x / week   ? PT Duration 6 weeks   ? PT Treatment/Interventions ADLs/Self Care Home Management;Gait training;Stair training;Functional mobility training;Therapeutic activities;Therapeutic exercise;Balance training;Neuromuscular re-education;Patient/family education;Energy conservation   ? PT Next Visit Plan nustep, lower extremity strengthening   ? Consulted and Agree with Plan of Care Patient   ? ?  ?  ? ?  ? ? ?Patient will benefit from skilled therapeutic intervention in order to improve the following deficits and impairments:  Abnormal gait, Difficulty walking, Decreased activity tolerance, Decreased balance, Impaired sensation, Decreased strength, Decreased mobility ? ?Visit Diagnosis: ?History of falling ? ?Difficulty in  walking, not elsewhere classified ? ?Muscle weakness (generalized) ? ? ? ? ?Problem List ?Patient Active Problem List  ? Diagnosis Date Noted  ? Unilateral primary osteoarthritis, right knee 08/26/2020  ? S/P fusion of thoracic spine 03/12/2020  ? S/P cubital tunnel release 09/05/2019  ?  Seborrheic keratoses 07/16/2019  ? Cubital tunnel syndrome on left 07/03/2019  ? Numbness and tingling in left hand 06/24/2019  ? Ulnar neuropathy of left upper extremity 06/24/2019  ? Absolute anemia 03/05/2018  ? S/P ICD (internal cardiac defibrillator) procedure 01/17/2018  ? Type 2 diabetes mellitus ([place]) 12/14/2015  ? Cardiomyopathy ([place]) 12/14/2015  ? Hyperlipidemia 12/14/2015  ? Squamous cell carcinoma of skin of left cheek 10/26/2015  ? Congestive heart failure ([place]) 10/09/2013  ? Action tremor 06/04/2013  ? Personal history of other malignant neoplasm of skin 11/14/2012  ? Ankylosis of spine 04/20/2011  ? Fracture of T6 vertebra ([place]) 04/20/2011  ? Hiatal hernia 09/02/2010  ? Barrett's esophagus 09/02/2010  ? Carotid stenosis, bilateral 09/02/2010  ? Hypertension 08/31/2010  ? CAD (coronary artery disease) 08/31/2010  ? PAD (peripheral artery disease) ([place]) 08/31/2010  ? DDD (degenerative disc disease), lumbar 08/31/2010  ? Chronic low back pain 08/31/2010  ? Actinic keratosis 08/31/2010  ? Melanoma ([place]) 08/31/2010  ? Anterior circulation transient ischemic attack 05/22/2009  ? Stroke [facility]) 10/25/2005  ? ? ?Kathrynn Ducking, PTA ?07/20/2021, 2:55 PM ? ?[facility] ?[address] ?[place], [place], [address] ?Phone: [phone]   Fax:  [phone] ? ?Name: Edward Heath ?MRN: [id number] ?Date of Birth: 30-Jan-1946 ? ?PHYSICAL THERAPY DISCHARGE SUMMARY ? ?Visits from Start of Care: 16 ? ?Current functional level related to goals / functional outcomes: ?Patient was able to meet all of her goals for therapy.  ?  ?Remaining deficits: ?Right lower extremity pain  ?  ?Education /  Equipment: ?HEP   ? ?Patient agrees to discharge. Patient goals were met. Patient is being discharged due to meeting the stated rehab goals. ? ?Jacqulynn Cadet, PT, DPT   ? ?

## 2021-08-22 ENCOUNTER — Other Ambulatory Visit: Payer: Self-pay | Admitting: Family Medicine

## 2021-10-11 ENCOUNTER — Ambulatory Visit (INDEPENDENT_AMBULATORY_CARE_PROVIDER_SITE_OTHER): Payer: Medicare Other

## 2021-10-11 VITALS — Wt 228.0 lb

## 2021-10-11 DIAGNOSIS — Z0000 Encounter for general adult medical examination without abnormal findings: Secondary | ICD-10-CM | POA: Diagnosis not present

## 2021-10-11 NOTE — Patient Instructions (Signed)
Edward Heath , Thank you for taking time to come for your Medicare Wellness Visit. I appreciate your ongoing commitment to your health goals. Please review the following plan we discussed and let me know if I can assist you in the future.   Screening recommendations/referrals: Colonoscopy: Done at [facility] - no longer required Recommended yearly ophthalmology/optometry visit for glaucoma screening and checkup Recommended yearly dental visit for hygiene and checkup  Vaccinations: Influenza vaccine: Done at [facility] - repeat annually Pneumococcal vaccine: Done 03/22/2013 & 03/22/2014 Tdap vaccine: Done at [facility] - Repeat in 10 years  Shingles vaccine: Done at [place]: Done 07/02/2019, 07/30/2019, & 07/14/2020  Advanced directives: Please bring a copy of your health care power of attorney and living will to the office to be added to your chart at your convenience.   Conditions/risks identified: Aim for 30 minutes of exercise or brisk walking, 6-8 glasses of water, and 5 servings of fruits and vegetables each day.   Next appointment: Follow up in one year for your annual wellness visit.   Preventive Care 32 Years and Older, Male  Preventive care refers to lifestyle choices and visits with your health care provider that can promote health and wellness. What does preventive care include? A yearly physical exam. This is also called an annual well check. Dental exams once or twice a year. Routine eye exams. Ask your health care provider how often you should have your eyes checked. Personal lifestyle choices, including: Daily care of your teeth and gums. Regular physical activity. Eating a healthy diet. Avoiding tobacco and drug use. Limiting alcohol use. Practicing safe sex. Taking low doses of aspirin every day. Taking vitamin and mineral supplements as recommended by your health care provider. What happens during an annual well check? The services and screenings done by your health care provider  during your annual well check will depend on your age, overall health, lifestyle risk factors, and family history of disease. Counseling  Your health care provider may ask you questions about your: Alcohol use. Tobacco use. Drug use. Emotional well-being. Home and relationship well-being. Sexual activity. Eating habits. History of falls. Memory and ability to understand (cognition). Work and work Statistician. Screening  You may have the following tests or measurements: Height, weight, and BMI. Blood pressure. Lipid and cholesterol levels. These may be checked every 5 years, or more frequently if you are over 98 years old. Skin check. Lung cancer screening. You may have this screening every year starting at age 59 if you have a 30-pack-year history of smoking and currently smoke or have quit within the past 15 years. Fecal occult blood test (FOBT) of the stool. You may have this test every year starting at age 87. Flexible sigmoidoscopy or colonoscopy. You may have a sigmoidoscopy every 5 years or a colonoscopy every 10 years starting at age 47. Prostate cancer screening. Recommendations will vary depending on your family history and other risks. Hepatitis C blood test. Hepatitis B blood test. Sexually transmitted disease (STD) testing. Diabetes screening. This is done by checking your blood sugar (glucose) after you have not eaten for a while (fasting). You may have this done every 1-3 years. Abdominal aortic aneurysm (AAA) screening. You may need this if you are a current or former smoker. Osteoporosis. You may be screened starting at age 5 if you are at high risk. Talk with your health care provider about your test results, treatment options, and if necessary, the need for more tests. Vaccines  Your  health care provider may recommend certain vaccines, such as: Influenza vaccine. This is recommended every year. Tetanus, diphtheria, and acellular pertussis (Tdap, Td) vaccine. You  may need a Td booster every 10 years. Zoster vaccine. You may need this after age 87. Pneumococcal 13-valent conjugate (PCV13) vaccine. One dose is recommended after age 77. Pneumococcal polysaccharide (PPSV23) vaccine. One dose is recommended after age 52. Talk to your health care provider about which screenings and vaccines you need and how often you need them. This information is not intended to replace advice given to you by your health care provider. Make sure you discuss any questions you have with your health care provider. Document Released: 06/04/2015 Document Revised: 01/26/2016 Document Reviewed: 03/09/2015 Elsevier Interactive Patient Education  2017 [place] Prevention in the Home Falls can cause injuries. They can happen to people of all ages. There are many things you can do to make your home safe and to help prevent falls. What can I do on the outside of my home? Regularly fix the edges of walkways and driveways and fix any cracks. Remove anything that might make you trip as you walk through a door, such as a raised step or threshold. Trim any bushes or trees on the path to your home. Use bright outdoor lighting. Clear any walking paths of anything that might make someone trip, such as rocks or tools. Regularly check to see if handrails are loose or broken. Make sure that both sides of any steps have handrails. Any raised decks and porches should have guardrails on the edges. Have any leaves, snow, or ice cleared regularly. Use sand or salt on walking paths during winter. Clean up any spills in your garage right away. This includes oil or grease spills. What can I do in the bathroom? Use night lights. Install grab bars by the toilet and in the tub and shower. Do not use towel bars as grab bars. Use non-skid mats or decals in the tub or shower. If you need to sit down in the shower, use a plastic, non-slip stool. Keep the floor dry. Clean up any water that spills  on the floor as soon as it happens. Remove soap buildup in the tub or shower regularly. Attach bath mats securely with double-sided non-slip rug tape. Do not have throw rugs and other things on the floor that can make you trip. What can I do in the bedroom? Use night lights. Make sure that you have a light by your bed that is easy to reach. Do not use any sheets or blankets that are too big for your bed. They should not hang down onto the floor. Have a firm chair that has side arms. You can use this for support while you get dressed. Do not have throw rugs and other things on the floor that can make you trip. What can I do in the kitchen? Clean up any spills right away. Avoid walking on wet floors. Keep items that you use a lot in easy-to-reach places. If you need to reach something above you, use a strong step stool that has a grab bar. Keep electrical cords out of the way. Do not use floor polish or wax that makes floors slippery. If you must use wax, use non-skid floor wax. Do not have throw rugs and other things on the floor that can make you trip. What can I do with my stairs? Do not leave any items on the stairs. Make sure that there are handrails  on both sides of the stairs and use them. Fix handrails that are broken or loose. Make sure that handrails are as long as the stairways. Check any carpeting to make sure that it is firmly attached to the stairs. Fix any carpet that is loose or worn. Avoid having throw rugs at the top or bottom of the stairs. If you do have throw rugs, attach them to the floor with carpet tape. Make sure that you have a light switch at the top of the stairs and the bottom of the stairs. If you do not have them, ask someone to add them for you. What else can I do to help prevent falls? Wear shoes that: Do not have high heels. Have rubber bottoms. Are comfortable and fit you well. Are closed at the toe. Do not wear sandals. If you use a stepladder: Make  sure that it is fully opened. Do not climb a closed stepladder. Make sure that both sides of the stepladder are locked into place. Ask someone to hold it for you, if possible. Clearly mark and make sure that you can see: Any grab bars or handrails. First and last steps. Where the edge of each step is. Use tools that help you move around (mobility aids) if they are needed. These include: Canes. Walkers. Scooters. Crutches. Turn on the lights when you go into a dark area. Replace any light bulbs as soon as they burn out. Set up your furniture so you have a clear path. Avoid moving your furniture around. If any of your floors are uneven, fix them. If there are any pets around you, be aware of where they are. Review your medicines with your doctor. Some medicines can make you feel dizzy. This can increase your chance of falling. Ask your doctor what other things that you can do to help prevent falls. This information is not intended to replace advice given to you by your health care provider. Make sure you discuss any questions you have with your health care provider. Document Released: 03/04/2009 Document Revised: 10/14/2015 Document Reviewed: 06/12/2014 Elsevier Interactive Patient Education  2017 [employer].

## 2021-10-11 NOTE — Progress Notes (Signed)
Subjective:   Edward Heath is a 76 y.o. male who presents for Medicare Annual/Subsequent preventive examination.  Virtual Visit via Telephone Note  I connected with  Edward Heath on 10/11/21 at  2:45 PM EDT by telephone and verified that I am speaking with the correct person using two identifiers.  Location: Patient: Home Provider: [facility] Persons participating in the virtual visit: patient/Nurse Health Advisor   I discussed the limitations, risks, security and privacy concerns of performing an evaluation and management service by telephone and the availability of in person appointments. The patient expressed understanding and agreed to proceed.  Interactive audio and video telecommunications were attempted between this nurse and patient, however failed, due to patient having technical difficulties OR patient did not have access to video capability.  We continued and completed visit with audio only.  Some vital signs may be absent or patient reported.   Lindsay Straka E Joyice Magda, LPN   Review of Systems     Cardiac Risk Factors include: advanced age (>19mn, >>43women);male gender;diabetes mellitus;dyslipidemia;hypertension;obesity (BMI >30kg/m2);Other (see comment), Risk factor comments: CHF, CAD, PAD, cardiomyopathy, hx of stroke, carotid stenosis, anemia     Objective:    Today's Vitals   10/11/21 1451  Weight: 228 lb (103.4 kg)  PainSc: 6    Body mass index is 31.8 kg/m.     10/11/2021    2:59 PM 05/18/2021    2:58 PM 03/02/2021   10:42 AM 10/08/2020   10:53 AM 09/05/2019    9:41 AM 09/03/2019   10:42 AM 03/25/2018    2:52 PM  Advanced Directives  Does Patient Have a Medical Advance Directive? Yes No Yes Yes No No No  Type of AParamedicof ADavisLiving will  [place]    Does patient want to make changes to medical advance directive?   No - Patient declined      Copy of [place] Chart? No - copy requested  No - copy requested No - copy requested     Would patient like information on creating a medical advance directive?     No - Patient declined No - Patient declined No - Patient declined    Current Medications (verified) Outpatient Encounter Medications as of 10/11/2021  Medication Sig   acetaminophen (TYLENOL) 650 MG CR tablet Take 1,300 mg by mouth every 6 (six) hours as needed for pain.   amiodarone (PACERONE) 200 MG tablet Take 200 mg by mouth daily.   apixaban (ELIQUIS) 5 MG TABS tablet Take 1 tablet (5 mg total) by mouth 2 (two) times daily. 2 BID X 7 days then 1 BID (Patient taking differently: Take 5 mg by mouth daily.)   atorvastatin (LIPITOR) 80 MG tablet Take 40 mg by mouth daily.   Cholecalciferol (VITAMIN D-1000 MAX ST) 1000 units tablet Take 1 tablet (1,000 Units total) by mouth 2 (two) times daily.   Continuous Blood Gluc Receiver (FREESTYLE LIBRE 2 READER) DEVI Use as directed to check BS Dx E11.9   Continuous Blood Gluc Sensor (FREESTYLE LIBRE 2 SENSOR) MISC USE AS DIRECTED, CHANGING EVERY 14 DAYS   cyanocobalamin 1000 MCG tablet Take 1,000 mcg by mouth daily.   ferrous sulfate 325 (65 FE) MG tablet Take 325 mg by mouth daily with breakfast.    furosemide (LASIX) 40 MG tablet Take 1 tablet (40 mg total) by mouth 2 (two) times daily. (Patient taking differently: Take 40 mg  by mouth daily.)   insulin aspart (NOVOLOG FLEXPEN) 100 UNIT/ML FlexPen Inject 22 Units into the skin in the morning and at bedtime.   insulin aspart protamine- aspart (NOVOLOG MIX 70/30) (70-30) 100 UNIT/ML injection Inject 0.2 mLs (20 Units total) into the skin 2 (two) times daily with a meal. Inject 16 units into the skin in the morning and in the evening.   insulin glargine (LANTUS) 100 UNIT/ML injection Inject 0.55 mLs (55 Units total) into the skin at bedtime. (Patient taking differently: Inject 40 Units into the skin at bedtime.)   levothyroxine (SYNTHROID,  LEVOTHROID) 125 MCG tablet Take 125 mcg by mouth daily before breakfast.   loratadine (CLARITIN) 10 MG tablet Take 10 mg by mouth daily.   Magnesium 500 MG TABS Take 400 mg by mouth daily.   melatonin 3 MG TABS tablet Take 3 mg by mouth at bedtime.   metFORMIN (GLUCOPHAGE) 1000 MG tablet Take 1,000 mg by mouth 2 (two) times daily with a meal.    metoprolol succinate (TOPROL-XL) 100 MG 24 hr tablet Take 1 tablet (100 mg total) by mouth daily. Take with or immediately following a meal.   nitroGLYCERIN (NITROSTAT) 0.4 MG SL tablet Place 0.4 mg under the tongue every 5 (five) minutes as needed for chest pain.    omeprazole (PRILOSEC) 20 MG capsule Take 20 mg by mouth 2 (two) times daily.   Oxcarbazepine (TRILEPTAL) 300 MG tablet Take 300 mg by mouth 3 (three) times daily.   sacubitril-valsartan (ENTRESTO) 24-26 MG Take 1 tablet by mouth 2 (two) times daily.   No facility-administered encounter medications on file as of 10/11/2021.    Allergies (verified) Patient has no known allergies.   History: Past Medical History:  Diagnosis Date   AICD (automatic cardioverter/defibrillator) present    Arthritis    CHF (congestive heart failure) ([place])    Coronary artery disease    Diabetes mellitus without complication (HCC)    Dysrhythmia    GERD (gastroesophageal reflux disease)    Heart attack ([place]) 2004, 2015   History of kidney stones    Hyperlipidemia    Hypertension    Hypothyroidism    Lumbar vertebral fracture ([place]) 2012   NSTEMI, initial episode of care ([place]) 10/09/2013   Skin cancer 06/2014   left cheek   Stroke [facility])    x3   Thyroid disease    TIA (transient ischemic attack) 2009   Past Surgical History:  Procedure Laterality Date   CARDIAC DEFIBRILLATOR PLACEMENT  12/2003, Repaired in 2012   x3   [place]  06/2014   had 4 removed   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   Procedure: LEFT ELBOW ULNAR NERVE DECOMPRESSION;  Surgeon: [name], MD;  Location: [place];  Service: Orthopedics;  Laterality: Left;   Family History  Problem Relation Age of Onset   Heart attack Mother    Aneurysm Father    Heart attack Brother 53   Social History   Socioeconomic History   Marital status: Married    Spouse name: Vaughan Basta   Number of children: 2   Years of education: Not on file   Highest education level: Not on file  Occupational History   Occupation: retired    Comment: [employer]  Tobacco Use   Smoking status: Never   Smokeless tobacco: Current    Types: Chew  Vaping Use   Vaping Use: Never used  Substance and Sexual Activity  Alcohol use: Yes    Comment: occ   Drug use: No   Sexual activity: Yes  Other Topics Concern   Not on file  Social History Narrative   Lives home with wife - daughter and son stay sometimes - one granddaughter lives about an hour away.   Enjoys golfing      [facility] Benefits   Social Determinants of Health   Financial Resource Strain: Low Risk    Difficulty of Paying Living Expenses: Not hard at all  Food Insecurity: No Food Insecurity   Worried About Charity fundraiser in the Last Year: Never true   Arboriculturist in the Last Year: Never true  Transportation Needs: No Transportation Needs   Lack of Transportation (Medical): No   Lack of Transportation (Non-Medical): No  Physical Activity: Sufficiently Active   Days of Exercise per Week: 7 days   Minutes of Exercise per Session: 30 min  Stress: No Stress Concern Present   Feeling of Stress : Not at all  Social Connections: Socially Integrated   Frequency of Communication with Friends and Family: More than three times a week   Frequency of Social Gatherings with Friends and Family: More than three times a week   Attends Religious Services: More than 4 times per year   Active Member of [employer] or Organizations: Yes   Attends Music therapist: More than 4 times per year   Marital Status: Married    Tobacco Counseling Ready to  quit: Not Answered Counseling given: Not Answered   Clinical Intake:  Pre-visit preparation completed: Yes  Pain : 0-10 Pain Score: 6  Pain Type: Chronic pain Pain Location: Generalized Pain Descriptors / Indicators: Aching Pain Onset: More than a month ago Pain Frequency: Intermittent     BMI - recorded: 31.8 Nutritional Status: BMI > 30  Obese Nutritional Risks: None Diabetes: Yes CBG done?: No Did pt. bring in CBG monitor from home?: No  How often do you need to have someone help you when you read instructions, pamphlets, or other written materials from your doctor or pharmacy?: 1 - Never  Diabetic? Nutrition Risk Assessment:  Has the patient had any N/V/D within the last 2 months?  No  Does the patient have any non-healing wounds?  No  Has the patient had any unintentional weight loss or weight gain?  No   Diabetes:  Is the patient diabetic?  Yes  If diabetic, was a CBG obtained today?  No  Did the patient bring in their glucometer from home?  No  How often do you monitor your CBG's? CGM - Freestyle Libre 2 - checks often. 107 right now  Financial Strains and Diabetes Management:  Are you having any financial strains with the device, your supplies or your medication? No .  Does the patient want to be seen by Chronic Care Management for management of their diabetes?  No  Would the patient like to be referred to a Nutritionist or for Diabetic Management?  No   Diabetic Exams:  Diabetic Eye Exam: Completed annually at [place].  Diabetic Foot Exam: Completed annually at [place].  Interpreter Needed?: No  Information entered by :: Eathan Groman, LPN   Activities of Daily Living    10/11/2021    3:00 PM  In your present state of health, do you have any difficulty performing the following activities:  Hearing? 1  Comment has hearing aids  Vision? 0  Difficulty concentrating or making decisions? 0  Walking or climbing stairs? 0  Dressing or bathing? 0  Doing  errands, shopping? 0  Preparing Food and eating ? N  Using the Toilet? N  In the past six months, have you accidently leaked urine? N  Do you have problems with loss of bowel control? N  Managing your Medications? N  Managing your Finances? N  Housekeeping or managing your Housekeeping? N    Patient Care Team: Claretta Fraise, MD as PCP - General (Family Medicine) Loney Loh, MD as Consulting Physician (Dermatology) Daryel November, MD as Consulting Physician (Urology) Louanna Raw, MD as Referring Physician (Diagnostic Radiology)  Indicate any recent Medical Services you may have received from other than [facility] providers in the past year (date may be approximate).     Assessment:   This is a routine wellness examination for [place].  Hearing/Vision screen Hearing Screening - Comments:: Has hearing aids - doesn't like to wear them - got them from [place] - Comments:: Wears rx glasses prn only - [facility]  Dietary issues and exercise activities discussed: Current Exercise Habits: Home exercise routine, Type of exercise: walking;Other - see comments;stretching (30-40 squats daily, stationary arm/leg cycle 15-20 min daily; golfing twice per week), Time (Minutes): 30, Frequency (Times/Week): 7, Weekly Exercise (Minutes/Week): 210, Intensity: Mild, Exercise limited by: cardiac condition(s);orthopedic condition(s)   Goals Addressed             This Visit's Progress    Exercise 3x per week (30 min per time)   On track    Exercise at the [facility] or walk for 1 hour 3 times per week.        Depression Screen    10/11/2021    2:57 PM 05/03/2021    1:29 PM 11/01/2020   12:51 PM 10/08/2020   10:44 AM 06/03/2020    8:26 AM 12/11/2018    8:30 AM 11/07/2018   10:42 AM  PHQ 2/9 Scores  PHQ - 2 Score 0 0 0 0 0 0 0    Fall Risk    10/11/2021    2:53 PM 05/03/2021    1:33 PM 05/03/2021    1:29 PM 11/01/2020   12:51 PM  10/08/2020   10:52 AM  [place] in the past year? 1 1 0 1 1  Number falls in past yr: 1 1   0  Injury with Fall? 0 0  1 1  Risk for fall due to : History of fall(s);Orthopedic patient History of fall(s);Impaired balance/gait;Impaired mobility  Impaired balance/gait;Orthopedic patient;History of fall(s) Impaired balance/gait;Orthopedic patient;History of fall(s);Impaired vision  Follow up Falls prevention discussed;Education provided Falls evaluation completed  Falls evaluation completed Education provided;Falls prevention discussed    FALL RISK PREVENTION PERTAINING TO THE HOME:  Any stairs in or around the home? Yes  also has ramp If so, are there any without handrails? No  Home free of loose throw rugs in walkways, pet beds, electrical cords, etc? Yes  Adequate lighting in your home to reduce risk of falls? Yes   ASSISTIVE DEVICES UTILIZED TO PREVENT FALLS:  Life alert? No  Use of a cane, walker or w/c? Yes  Grab bars in the bathroom? Yes  Shower chair or bench in shower? Yes  Elevated toilet seat or a handicapped toilet? Yes   TIMED UP AND GO:  Was the test performed? No . Telephonic visit  Cognitive Function:    03/25/2018  4:24 PM 12/20/2016   10:19 AM 12/14/2015    9:57 AM 08/05/2014    4:32 PM  MMSE - Mini Mental State Exam  Orientation to time '5 5 5 5  '$ Orientation to Place '5 5 5 5  '$ Registration '3 3 3 3  '$ Attention/ Calculation '4 4 4 4  '$ Recall '3 3 2 2  '$ Language- name 2 objects '2 2 2 2  '$ Language- repeat '1 1 1 1  '$ Language- follow 3 step command '3 3 3 3  '$ Language- read & follow direction '1 1 1 1  '$ Write a sentence '1 1 1 1  '$ Copy design 1 1 0 1  Total score '29 29 27 28        '$ 10/11/2021    3:01 PM  6CIT Screen  What Year? 0 points  What month? 0 points  What time? 0 points  Count back from 20 0 points  Months in reverse 0 points  Repeat phrase 4 points  Total Score 4 points    Immunizations Immunization History  Administered Date(s) Administered    Influenza Split 04/18/2017   Influenza, High Dose Seasonal PF 03/06/2013, 01/26/2015   Influenza,inj,quad, With Preservative 02/21/2019   Influenza-Unspecified 03/22/2014, 02/17/2016, 02/21/2019   Moderna SARS-COV2 Booster Vaccination 07/14/2020   Moderna Sars-Covid-2 Vaccination 07/02/2019, 07/30/2019   Pneumococcal Conjugate-13 03/22/2014   Pneumococcal Polysaccharide-23 03/22/2013, 06/05/2013   Zoster, Live 11/19/2013    TDAP status: Up to date  Flu Vaccine status: Up to date  Pneumococcal vaccine status: Up to date  Covid-19 vaccine status: Completed vaccines  Qualifies for Shingles Vaccine? Yes   Zostavax completed Yes   Shingrix Completed?: Yes  Screening Tests Health Maintenance  Topic Date Due   OPHTHALMOLOGY EXAM  Never done   Hepatitis C Screening  Never done   TETANUS/TDAP  Never done   Zoster Vaccines- Shingrix (1 of 2) Never done   FOOT EXAM  12/20/2017   COVID-19 Vaccine (3 - Moderna risk series) 08/11/2020   HEMOGLOBIN A1C  12/01/2020   INFLUENZA VACCINE  12/20/2021   DEXA SCAN  02/02/2026   Pneumonia Vaccine 38+ Years old  Completed   HPV VACCINES  Aged Out    Health Maintenance  Health Maintenance Due  Topic Date Due   OPHTHALMOLOGY EXAM  Never done   Hepatitis C Screening  Never done   TETANUS/TDAP  Never done   Zoster Vaccines- Shingrix (1 of 2) Never done   FOOT EXAM  12/20/2017   COVID-19 Vaccine (3 - Moderna risk series) 08/11/2020   HEMOGLOBIN A1C  12/01/2020    Colorectal cancer screening: No longer required.   Lung Cancer Screening: (Low Dose CT Chest recommended if Age 72-80 years, 30 pack-year currently smoking OR have quit w/in 15years.) does not qualify.  Additional Screening:  Hepatitis C Screening: does qualify; Completed at [place]: Recommended annual ophthalmology exams for early detection of glaucoma and other disorders of the eye. Is the patient up to date with their annual eye exam?  Yes  Who is the  provider or what is the name of the [facility] the [facility] If pt is not established with a provider, would they like to be referred to a provider to establish care? No .   Dental Screening: Recommended annual dental exams for proper oral hygiene  Community Resource Referral / Chronic Care Management: CRR required this visit?  No   CCM required this visit?  No  Plan:     I have personally reviewed and noted the following in the patient's chart:   Medical and social history Use of alcohol, tobacco or illicit drugs  Current medications and supplements including opioid prescriptions. Patient is not currently taking opioid prescriptions. Functional ability and status Nutritional status Physical activity Advanced directives List of other physicians Hospitalizations, surgeries, and ER visits in previous 12 months Vitals Screenings to include cognitive, depression, and falls Referrals and appointments  In addition, I have reviewed and discussed with patient certain preventive protocols, quality metrics, and best practice recommendations. A written personalized care plan for preventive services as well as general preventive health recommendations were provided to patient.     Sandrea Hammond, LPN   [id number]/[id number]   Nurse Notes: I wasn't able to verify each medication. His wife was unavailable and he insisted his medicines are the same as they were at last visit. He says she handles all of that.

## 2021-10-19 DIAGNOSIS — Z85828 Personal history of other malignant neoplasm of skin: Secondary | ICD-10-CM | POA: Diagnosis not present

## 2021-10-19 DIAGNOSIS — C44329 Squamous cell carcinoma of skin of other parts of face: Secondary | ICD-10-CM | POA: Diagnosis not present

## 2021-10-19 DIAGNOSIS — C4442 Squamous cell carcinoma of skin of scalp and neck: Secondary | ICD-10-CM | POA: Diagnosis not present

## 2021-10-19 DIAGNOSIS — L570 Actinic keratosis: Secondary | ICD-10-CM | POA: Diagnosis not present

## 2021-10-19 DIAGNOSIS — C44222 Squamous cell carcinoma of skin of right ear and external auricular canal: Secondary | ICD-10-CM | POA: Diagnosis not present

## 2021-11-09 DIAGNOSIS — Z0289 Encounter for other administrative examinations: Secondary | ICD-10-CM

## 2021-11-14 ENCOUNTER — Other Ambulatory Visit: Payer: Self-pay | Admitting: Family Medicine

## 2022-01-01 ENCOUNTER — Other Ambulatory Visit: Payer: Self-pay | Admitting: Family Medicine

## 2022-01-17 ENCOUNTER — Encounter: Payer: Self-pay | Admitting: Family Medicine

## 2022-01-17 ENCOUNTER — Ambulatory Visit (INDEPENDENT_AMBULATORY_CARE_PROVIDER_SITE_OTHER): Payer: Medicare Other | Admitting: Family Medicine

## 2022-01-17 VITALS — BP 105/60 | HR 73 | Temp 97.4°F | Ht 71.0 in | Wt 213.2 lb

## 2022-01-17 DIAGNOSIS — E1159 Type 2 diabetes mellitus with other circulatory complications: Secondary | ICD-10-CM | POA: Diagnosis not present

## 2022-01-17 DIAGNOSIS — R7989 Other specified abnormal findings of blood chemistry: Secondary | ICD-10-CM | POA: Diagnosis not present

## 2022-01-17 DIAGNOSIS — D508 Other iron deficiency anemias: Secondary | ICD-10-CM | POA: Diagnosis not present

## 2022-01-17 DIAGNOSIS — E782 Mixed hyperlipidemia: Secondary | ICD-10-CM | POA: Diagnosis not present

## 2022-01-17 DIAGNOSIS — Z794 Long term (current) use of insulin: Secondary | ICD-10-CM | POA: Diagnosis not present

## 2022-01-17 DIAGNOSIS — I10 Essential (primary) hypertension: Secondary | ICD-10-CM | POA: Diagnosis not present

## 2022-01-17 LAB — BAYER DCA HB A1C WAIVED: HB A1C (BAYER DCA - WAIVED): 6.6 % — ABNORMAL HIGH (ref 4.8–5.6)

## 2022-01-17 NOTE — Progress Notes (Signed)
Subjective:  Patient ID: Edward Heath,  male    DOB: Aug 20, 1945  Age: 76 y.o.    CC: Medical Management of Chronic Issues   HPI Edward Heath presents for  follow-up of hypertension. Patient has no history of headache chest pain or shortness of breath or recent cough. Patient also denies symptoms of TIA such as numbness weakness lateralizing. Off BP meds due to weight loss.   Patient also  in for follow-up of elevated cholesterol. Doing well without complaints on current medication. Denies side effects  including myalgia and arthralgia and nausea. Also in today for liver function testing. Currently no chest pain, shortness of breath or other cardiovascular related symptoms noted.  Follow-up of diabetes. Patient does check blood sugar at home. Readings run between 70 and 200. Takes insulin 10 -12 units when Elenor Legato warns him it is over 200 Patient denies symptoms such as excessive hunger or urinary frequency, excessive hunger, nausea No significant hypoglycemic spells noted. Medications reviewed. Pt reports taking them regularly. Pt. denies complication/adverse reaction today.    History Edward Heath has a past medical history of AICD (automatic cardioverter/defibrillator) present, Arthritis, CHF (congestive heart failure) ([place]), Coronary artery disease, Diabetes mellitus without complication ([place]), Dysrhythmia, GERD (gastroesophageal reflux disease), Heart attack ([place]) (2004, 2015), History of kidney stones, Hyperlipidemia, Hypertension, Hypothyroidism, Lumbar vertebral fracture ([place]) (2012), NSTEMI, initial episode of care [facility]) (10/09/2013), Skin cancer (06/2014), Stroke [facility]), Thyroid disease, and TIA (transient ischemic attack) (2009).   He has a past surgical history that includes Cardiac defibrillator placement (12/2003, Repaired in 2012); Skin cancer excision (06/2014); Knee surgery (Right, 1994); and Ulnar nerve transposition (Left, 09/05/2019).   His family history includes Aneurysm in  his father; Heart attack in his mother; Heart attack (age of onset: 4) in his brother.He reports that he has never smoked. His smokeless tobacco use includes chew. He reports current alcohol use. He reports that he does not use drugs.  Current Outpatient Medications on File Prior to Visit  Medication Sig Dispense Refill   acetaminophen (TYLENOL) 650 MG CR tablet Take 1,300 mg by mouth every 6 (six) hours as needed for pain.     amiodarone (PACERONE) 200 MG tablet Take 200 mg by mouth daily.     apixaban (ELIQUIS) 5 MG TABS tablet Take 1 tablet (5 mg total) by mouth 2 (two) times daily. 2 BID X 7 days then 1 BID (Patient taking differently: Take 5 mg by mouth daily.) 75 tablet 0   atorvastatin (LIPITOR) 80 MG tablet Take 40 mg by mouth daily.     Cholecalciferol (VITAMIN D-1000 MAX ST) 1000 units tablet Take 1 tablet (1,000 Units total) by mouth 2 (two) times daily. 60 tablet 11   Continuous Blood Gluc Receiver (FREESTYLE LIBRE 2 READER) DEVI Use as directed to check BS Dx E11.9 1 each 0   Continuous Blood Gluc Sensor (FREESTYLE LIBRE 2 SENSOR) MISC USE AS DIRECTED, CHANGING EVERY 14 DAYS 2 each 1   cyanocobalamin 1000 MCG tablet Take 1,000 mcg by mouth daily.     ferrous sulfate 325 (65 FE) MG tablet Take 325 mg by mouth daily with breakfast.      furosemide (LASIX) 40 MG tablet Take 1 tablet (40 mg total) by mouth 2 (two) times daily. (Patient taking differently: Take 40 mg by mouth daily.) 60 tablet 0   insulin aspart (NOVOLOG FLEXPEN) 100 UNIT/ML FlexPen Inject 22 Units into the skin in the morning and at bedtime.     insulin aspart  protamine- aspart (NOVOLOG MIX 70/30) (70-30) 100 UNIT/ML injection Inject 0.2 mLs (20 Units total) into the skin 2 (two) times daily with a meal. Inject 16 units into the skin in the morning and in the evening. 20 mL 11   insulin glargine (LANTUS) 100 UNIT/ML injection Inject 0.55 mLs (55 Units total) into the skin at bedtime. (Patient taking differently: Inject 40  Units into the skin at bedtime.) 10 mL 2   levothyroxine (SYNTHROID, LEVOTHROID) 125 MCG tablet Take 125 mcg by mouth daily before breakfast.     loratadine (CLARITIN) 10 MG tablet Take 10 mg by mouth daily.     Magnesium 500 MG TABS Take 400 mg by mouth daily.     melatonin 3 MG TABS tablet Take 3 mg by mouth at bedtime.     metFORMIN (GLUCOPHAGE) 1000 MG tablet Take 1,000 mg by mouth 2 (two) times daily with a meal.      metoprolol succinate (TOPROL-XL) 100 MG 24 hr tablet Take 1 tablet (100 mg total) by mouth daily. Take with or immediately following a meal. 90 tablet 0   omeprazole (PRILOSEC) 20 MG capsule Take 20 mg by mouth 2 (two) times daily.     Oxcarbazepine (TRILEPTAL) 300 MG tablet Take 300 mg by mouth 3 (three) times daily.     sacubitril-valsartan (ENTRESTO) 24-26 MG Take 1 tablet by mouth 2 (two) times daily.     nitroGLYCERIN (NITROSTAT) 0.4 MG SL tablet Place 0.4 mg under the tongue every 5 (five) minutes as needed for chest pain.      No current facility-administered medications on file prior to visit.    ROS Review of Systems  Constitutional: Negative.   HENT: Negative.    Eyes:  Negative for visual disturbance.  Respiratory:  Negative for cough and shortness of breath.   Cardiovascular:  Negative for chest pain and leg swelling.  Gastrointestinal:  Negative for abdominal pain, diarrhea, nausea and vomiting.  Genitourinary:  Negative for difficulty urinating.  Musculoskeletal:  Negative for arthralgias and myalgias.  Skin:  Negative for rash.  Neurological:  Negative for headaches.  Psychiatric/Behavioral:  Negative for sleep disturbance.     Objective:  BP 105/60   Pulse 73   Temp (!) 97.4 F (36.3 C)   Ht '5\' 11"'  (1.803 m)   Wt 213 lb 3.2 oz (96.7 kg)   SpO2 97%   BMI 29.74 kg/m   BP Readings from Last 3 Encounters:  01/17/22 105/60  05/03/21 (!) 107/58  11/01/20 (!) 107/58    Wt Readings from Last 3 Encounters:  01/17/22 213 lb 3.2 oz (96.7 kg)   10/11/21 228 lb (103.4 kg)  05/03/21 228 lb 6.4 oz (103.6 kg)     Physical Exam Vitals reviewed.  Constitutional:      Appearance: He is well-developed.  HENT:     Head: Normocephalic and atraumatic.     Right Ear: External ear normal.     Left Ear: External ear normal.     Mouth/Throat:     Pharynx: No oropharyngeal exudate or posterior oropharyngeal erythema.  Eyes:     Pupils: Pupils are equal, round, and reactive to light.  Cardiovascular:     Rate and Rhythm: Normal rate and regular rhythm.     Heart sounds: No murmur heard. Pulmonary:     Effort: No respiratory distress.     Breath sounds: Normal breath sounds.  Musculoskeletal:     Cervical back: Normal range of motion and neck supple.  Neurological:     Mental Status: He is alert and oriented to person, place, and time.     Diabetic Foot Exam - Simple   No data filed     Lab Results  Component Value Date   HGBA1C 6.5 06/03/2020   HGBA1C 8.3 (H) 09/03/2019   HGBA1C 9.6 (H) 09/27/2018    Assessment & Plan:   Edward Heath was seen today for medical management of chronic issues.  Diagnoses and all orders for this visit:  Type 2 diabetes mellitus with other circulatory complication, with long-term current use of insulin (HCC) -     Bayer DCA Hb A1c Waived -     Microalbumin / creatinine urine ratio  Mixed hyperlipidemia -     Lipid panel  Elevated serum free T4 level -     TSH + free T4  Other iron deficiency anemia -     CBC with Differential/Platelet  Hypertension, unspecified type -     CMP14+EGFR   I am having Eligha D. Hippert maintain his omeprazole, atorvastatin, Magnesium, metFORMIN, nitroGLYCERIN, Cholecalciferol, apixaban, metoprolol succinate, ferrous sulfate, cyanocobalamin, levothyroxine, amiodarone, furosemide, loratadine, insulin glargine, insulin aspart protamine- aspart, NovoLOG FlexPen, sacubitril-valsartan, melatonin, acetaminophen, Oxcarbazepine, FreeStyle Libre 2 Reader, and  [employer] 2 Sensor.  No orders of the defined types were placed in this encounter.    Follow-up: Return in about 3 months (around 04/19/2022).  Claretta Fraise, M.D.

## 2022-01-17 NOTE — Addendum Note (Signed)
Addended by: Christia Reading on: 01/17/2022 05:16 PM   Modules accepted: Orders

## 2022-01-18 LAB — LIPID PANEL
Chol/HDL Ratio: 4.2 ratio (ref 0.0–5.0)
Cholesterol, Total: 202 mg/dL — ABNORMAL HIGH (ref 100–199)
HDL: 48 mg/dL (ref >39)
LDL Chol Calc (NIH): 119 mg/dL — ABNORMAL HIGH (ref 0–99)
Triglycerides: 201 mg/dL — ABNORMAL HIGH (ref 0–149)
VLDL Cholesterol Cal: 35 mg/dL (ref 5–40)

## 2022-01-18 LAB — CBC WITH DIFFERENTIAL/PLATELET
Basophils Absolute: 0.1 10*3/uL (ref 0.0–0.2)
Basos: 1 %
EOS (ABSOLUTE): 0.2 10*3/uL (ref 0.0–0.4)
Eos: 2 %
Hematocrit: 44.9 % (ref 37.5–51.0)
Hemoglobin: 15.2 g/dL (ref 13.0–17.7)
Immature Grans (Abs): 0 10*3/uL (ref 0.0–0.1)
Immature Granulocytes: 0 %
Lymphocytes Absolute: 2.4 10*3/uL (ref 0.7–3.1)
Lymphs: 27 %
MCH: 30.5 pg (ref 26.6–33.0)
MCHC: 33.9 g/dL (ref 31.5–35.7)
MCV: 90 fL (ref 79–97)
Monocytes Absolute: 0.8 10*3/uL (ref 0.1–0.9)
Monocytes: 9 %
Neutrophils Absolute: 5.6 10*3/uL (ref 1.4–7.0)
Neutrophils: 61 %
Platelets: 320 10*3/uL (ref 150–450)
RBC: 4.98 x10E6/uL (ref 4.14–5.80)
RDW: 12.3 % (ref 11.6–15.4)
WBC: 9.1 10*3/uL (ref 3.4–10.8)

## 2022-01-18 LAB — TSH+FREE T4
Free T4: 1.3 ng/dL (ref 0.82–1.77)
TSH: 2.22 u[IU]/mL (ref 0.450–4.500)

## 2022-01-18 LAB — CMP14+EGFR
ALT: 9 IU/L (ref 0–44)
AST: 14 IU/L (ref 0–40)
Albumin/Globulin Ratio: 1.6 (ref 1.2–2.2)
Albumin: 4.2 g/dL (ref 3.8–4.8)
Alkaline Phosphatase: 118 IU/L (ref 44–121)
BUN/Creatinine Ratio: 13 (ref 10–24)
BUN: 24 mg/dL (ref 8–27)
Bilirubin Total: 0.3 mg/dL (ref 0.0–1.2)
CO2: 25 mmol/L (ref 20–29)
Calcium: 9.9 mg/dL (ref 8.6–10.2)
Chloride: 96 mmol/L (ref 96–106)
Creatinine, Ser: 1.84 mg/dL — ABNORMAL HIGH (ref 0.76–1.27)
Globulin, Total: 2.7 g/dL (ref 1.5–4.5)
Glucose: 156 mg/dL — ABNORMAL HIGH (ref 70–99)
Potassium: 4.8 mmol/L (ref 3.5–5.2)
Sodium: 138 mmol/L (ref 134–144)
Total Protein: 6.9 g/dL (ref 6.0–8.5)
eGFR: 37 mL/min/{1.73_m2} — ABNORMAL LOW (ref >59)

## 2022-01-19 LAB — MICROALBUMIN / CREATININE URINE RATIO
Creatinine, Urine: 116.4 mg/dL
Microalb/Creat Ratio: 94 mg/g creat — ABNORMAL HIGH (ref 0–29)
Microalbumin, Urine: 109.8 ug/mL

## 2022-01-31 DIAGNOSIS — C44329 Squamous cell carcinoma of skin of other parts of face: Secondary | ICD-10-CM | POA: Diagnosis not present

## 2022-01-31 DIAGNOSIS — C4442 Squamous cell carcinoma of skin of scalp and neck: Secondary | ICD-10-CM | POA: Diagnosis not present

## 2022-01-31 DIAGNOSIS — C44222 Squamous cell carcinoma of skin of right ear and external auricular canal: Secondary | ICD-10-CM | POA: Diagnosis not present

## 2022-02-23 ENCOUNTER — Other Ambulatory Visit: Payer: Self-pay | Admitting: Family Medicine

## 2022-03-23 ENCOUNTER — Encounter: Payer: Self-pay | Admitting: Orthopaedic Surgery

## 2022-03-23 ENCOUNTER — Ambulatory Visit (INDEPENDENT_AMBULATORY_CARE_PROVIDER_SITE_OTHER): Payer: Medicare Other

## 2022-03-23 ENCOUNTER — Ambulatory Visit (INDEPENDENT_AMBULATORY_CARE_PROVIDER_SITE_OTHER): Payer: Medicare Other | Admitting: Orthopaedic Surgery

## 2022-03-23 VITALS — Ht 71.0 in | Wt 213.0 lb

## 2022-03-23 DIAGNOSIS — M549 Dorsalgia, unspecified: Secondary | ICD-10-CM

## 2022-03-23 DIAGNOSIS — M545 Low back pain, unspecified: Secondary | ICD-10-CM

## 2022-03-23 DIAGNOSIS — G8929 Other chronic pain: Secondary | ICD-10-CM | POA: Diagnosis not present

## 2022-03-23 MED ORDER — HYDROCODONE-ACETAMINOPHEN 7.5-325 MG PO TABS: 1.0000 | ORAL_TABLET | Freq: Two times a day (BID) | ORAL | 0 refills | Status: AC | PRN

## 2022-03-23 NOTE — Progress Notes (Addendum)
Office Visit Note   Patient: Edward Heath           Date of Birth: 1946/01/18           MRN: [id number] Visit Date: 03/23/2022              Requested by: Claretta Fraise, MD [place],  [place] [id number] PCP: Claretta Fraise, MD   Assessment & Plan: Visit Diagnoses:  1. Mid back pain   2. Chronic bilateral low back pain, unspecified whether sciatica present     Plan: I sent in the refill for the Norco 7.5 single refill if he has to take any in the future left talk with his PCP.  Long discussion about avoiding the medicine that he states only occasionally does take 1 single tablet when nothing else is worked.  He cannot take anti-inflammatories with his pacemaker defibrillator.  We discussed he has some multilevel lumbar stenosis and with his other medical problems nonoperative treatment is recommended.  Follow-up as needed.  Follow-Up Instructions: Return if symptoms worsen or fail to improve.   Orders:  Orders Placed This Encounter  Procedures   XR Lumbar Spine 2-3 Views   XR Thoracic Spine 2 View   Meds ordered this encounter  Medications   HYDROcodone-acetaminophen (NORCO) 7.5-325 MG tablet    Sig: Take 1 tablet by mouth every 12 (twelve) hours as needed.    Dispense:  30 tablet    Refill:  0      Procedures: No procedures performed   Clinical Data: No additional findings.   Subjective: Chief Complaint  Patient presents with   Middle Back - Pain   Lower Back - Pain   Right Leg - Pain   Left Leg - Pain    HPI 76 year old male here with the low back pain primarily left-sided pain.  He states he goes out with his golf group and just puts.  He was on the golf course had a syncopal episode fell landing on his buttocks had a T9 burst fracture and then had fusion 3 above through below with instrumentation done at [facility].  Surgery was 12/23/2019.  He was in SNF initially good and walk gradually progressed and now walks in the house without a cane use  a cane when he goes out of the house.  He has some tingling numbness in his thighs at times.  Patient has a defibrillator unable to do an MRI scan.  He is occasionally taken 1 hydrocodone 7.5 tablet.  He states his balance is and is good.  Gabapentin did not help.  CT scan abdomen when he had bloody diarrhea last year showed endplate spurring anterior posterior with spurring that would contribute to severe lumbar stenosis multilevel.  Review of Systems positive for type 2 diabetes with improved A1c.  Cardiomyopathy heart failure, thoracic fusion, coronary disease.  No current chest pain negative for stroke.   Objective: Vital Signs: Ht '5\' 11"'$  (1.803 m)   Wt 213 lb (96.6 kg)   BMI 29.71 kg/m   Physical Exam Constitutional:      Appearance: He is well-developed.  HENT:     Head: Normocephalic and atraumatic.     Right Ear: External ear normal.     Left Ear: External ear normal.  Eyes:     Pupils: Pupils are equal, round, and reactive to light.  Neck:     Thyroid: No thyromegaly.     Trachea: No tracheal deviation.  Cardiovascular:  Rate and Rhythm: Normal rate.  Pulmonary:     Effort: Pulmonary effort is normal.     Breath sounds: No wheezing.  Abdominal:     General: Bowel sounds are normal.     Palpations: Abdomen is soft.  Musculoskeletal:     Cervical back: Neck supple.  Skin:    General: Skin is warm and dry.     Capillary Refill: Capillary refill takes less than 2 seconds.  Neurological:     Mental Status: He is alert and oriented to person, place, and time.  Psychiatric:        Behavior: Behavior normal.        Thought Content: Thought content normal.        Judgment: Judgment normal.     Ortho Exam patient has short stride shuffling gait.  Negative logroll the hips.  Knees reach full extension.  Crepitus with right knee range of motion.  Specialty Comments:  No specialty comments available.  Imaging: XR Thoracic Spine 2 View  Result Date: 03/23/2022 AP  lateral thoracic images are obtained and reviewed.  This shows T9 compression fracture with posterior instrumented fusion T6-T12 without evidence of backout of hardware.  Satisfactory alignment is maintained. Impression postop instrumentation fusion T6-T12 with T9 apparent fracture.  Satisfactory position and alignment noted.  XR Lumbar Spine 2-3 Views  Result Date: 03/23/2022 AP lateral lumbar images demonstrate multilevel anterior spurring with some posterior spurring at L3-4 slight retrolisthesis and L4-5.  Thoracic instrumentation is noted.  Mild lumbar curvature with endplate spurring. Impression: Multilevel lumbar spondylosis with osteophytes multiple levels.    PMFS History: Patient Active Problem List   Diagnosis Date Noted   Unilateral primary osteoarthritis, right knee 08/26/2020   S/P fusion of thoracic spine 03/12/2020   S/P cubital tunnel release 09/05/2019   Seborrheic keratoses 07/16/2019   Cubital tunnel syndrome on left 07/03/2019   Numbness and tingling in left hand 06/24/2019   Ulnar neuropathy of left upper extremity 06/24/2019   Absolute anemia 03/05/2018   S/P ICD (internal cardiac defibrillator) procedure 01/17/2018   Type 2 diabetes mellitus ([place]) 12/14/2015   Cardiomyopathy ([place]) 12/14/2015   Hyperlipidemia 12/14/2015   Squamous cell carcinoma of skin of left cheek 10/26/2015   Congestive heart failure ([place]) 10/09/2013   Action tremor 06/04/2013   Personal history of other malignant neoplasm of skin 11/14/2012   Ankylosis of spine 04/20/2011   Fracture of T6 vertebra ([place]) 04/20/2011   Hiatal hernia 09/02/2010   Barrett's esophagus 09/02/2010   Carotid stenosis, bilateral 09/02/2010   Hypertension 08/31/2010   CAD (coronary artery disease) 08/31/2010   PAD (peripheral artery disease) ([place]) 08/31/2010   DDD (degenerative disc disease), lumbar 08/31/2010   Chronic low back pain 08/31/2010   Actinic keratosis 08/31/2010   Melanoma ([place]) 08/31/2010    Anterior circulation transient ischemic attack 05/22/2009   Stroke ([place]) 10/25/2005   Past Medical History:  Diagnosis Date   AICD (automatic cardioverter/defibrillator) present    Arthritis    CHF (congestive heart failure) ([place])    Coronary artery disease    Diabetes mellitus without complication ([place])    Dysrhythmia    GERD (gastroesophageal reflux disease)    Heart attack ([place]) 2004, 2015   History of kidney stones    Hyperlipidemia    Hypertension    Hypothyroidism    Lumbar vertebral fracture ([place]) 2012   NSTEMI, initial episode of care ([place]) 10/09/2013   Skin cancer 06/2014   left cheek   Stroke ([place])  x3   Thyroid disease    TIA (transient ischemic attack) 2009    Family History  Problem Relation Age of Onset   Heart attack Mother    Aneurysm Father    Heart attack Brother 15    Past Surgical History:  Procedure Laterality Date   CARDIAC DEFIBRILLATOR PLACEMENT  12/2003, Repaired in 2012   x3   KNEE SURGERY Right 1994   SKIN CANCER EXCISION  06/2014   had 4 removed   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   Procedure: LEFT ELBOW ULNAR NERVE DECOMPRESSION;  Surgeon: [name], MD;  Location: [place];  Service: Orthopedics;  Laterality: Left;   Social History   Occupational History   Occupation: retired    Comment: [employer]  Tobacco Use   Smoking status: Never   Smokeless tobacco: Current    Types: Chew  Vaping Use   Vaping Use: Never used  Substance and Sexual Activity   Alcohol use: Yes    Comment: occ   Drug use: No   Sexual activity: Yes

## 2022-04-24 DIAGNOSIS — C44329 Squamous cell carcinoma of skin of other parts of face: Secondary | ICD-10-CM | POA: Diagnosis not present

## 2022-04-24 DIAGNOSIS — L570 Actinic keratosis: Secondary | ICD-10-CM | POA: Diagnosis not present

## 2022-04-24 DIAGNOSIS — Z85828 Personal history of other malignant neoplasm of skin: Secondary | ICD-10-CM | POA: Diagnosis not present

## 2022-07-20 ENCOUNTER — Ambulatory Visit (INDEPENDENT_AMBULATORY_CARE_PROVIDER_SITE_OTHER): Payer: Medicare Other | Admitting: Family Medicine

## 2022-07-20 ENCOUNTER — Encounter: Payer: Self-pay | Admitting: Family Medicine

## 2022-07-20 VITALS — BP 100/53 | HR 78 | Temp 97.5°F | Ht 71.0 in | Wt 206.2 lb

## 2022-07-20 DIAGNOSIS — H532 Diplopia: Secondary | ICD-10-CM

## 2022-07-20 DIAGNOSIS — I10 Essential (primary) hypertension: Secondary | ICD-10-CM | POA: Diagnosis not present

## 2022-07-20 DIAGNOSIS — E1159 Type 2 diabetes mellitus with other circulatory complications: Secondary | ICD-10-CM

## 2022-07-20 DIAGNOSIS — E782 Mixed hyperlipidemia: Secondary | ICD-10-CM

## 2022-07-20 DIAGNOSIS — Z794 Long term (current) use of insulin: Secondary | ICD-10-CM

## 2022-07-20 DIAGNOSIS — I152 Hypertension secondary to endocrine disorders: Secondary | ICD-10-CM | POA: Diagnosis not present

## 2022-07-20 DIAGNOSIS — R7989 Other specified abnormal findings of blood chemistry: Secondary | ICD-10-CM

## 2022-07-20 LAB — BAYER DCA HB A1C WAIVED: HB A1C (BAYER DCA - WAIVED): 7.6 % — ABNORMAL HIGH (ref 4.8–5.6)

## 2022-07-20 MED ORDER — DEXCOM G7 RECEIVER DEVI: 99 refills | Status: AC

## 2022-07-20 MED ORDER — DEXCOM G7 SENSOR MISC: 3 refills | Status: DC

## 2022-07-20 NOTE — Progress Notes (Signed)
Subjective:  Patient ID: Edward Heath,  male    DOB: Jul 17, 1945  Age: 77 y.o.    CC: Medical Management of Chronic Issues   HPI Edward Heath presents for  follow-up of hypertension. Patient has no history of headache chest pain or shortness of breath or recent cough. Patient also denies symptoms of TIA such as numbness weakness lateralizing. Patient denies side effects from medication. States taking it regularly.  Patient also  in for follow-up of elevated cholesterol. Doing well without complaints on current medication. Denies side effects  including myalgia and arthralgia and nausea. Also in today for liver function testing. Currently no chest pain, shortness of breath or other cardiovascular related symptoms noted.  Follow-up of diabetes. Patient does check blood sugar at home. Readings run higher than ever and lower, up and down. Was up to 310 yesterday. Average is 153, 152 fasting and overnight, 156 at noon and 150 at suppetime. Patient denies symptoms such as excessive hunger or urinary frequency, excessive hunger, nausea No significant hypoglycemic spells noted. Medications reviewed. Pt reports taking them regularly. Pt. denies complication/adverse reaction today.    History Edward Heath has a past medical history of AICD (automatic cardioverter/defibrillator) present, Arthritis, CHF (congestive heart failure) ([place]), Coronary artery disease, Diabetes mellitus without complication ([place]), Dysrhythmia, GERD (gastroesophageal reflux disease), Heart attack ([place]) (2004, 2015), History of kidney stones, Hyperlipidemia, Hypertension, Hypothyroidism, Lumbar vertebral fracture ([place]) (2012), NSTEMI, initial episode of care [facility]) (10/09/2013), Skin cancer (06/2014), Stroke [facility]), Thyroid disease, and TIA (transient ischemic attack) (2009).   Edward Heath has a past surgical history that includes Cardiac defibrillator placement (12/2003, Repaired in 2012); Skin cancer excision (06/2014); Knee surgery (Right,  1994); and Ulnar nerve transposition (Left, 09/05/2019).   His family history includes Aneurysm in his father; Heart attack in his mother; Heart attack (age of onset: 8) in his brother.Edward Heath reports that Edward Heath has never smoked. His smokeless tobacco use includes chew. Edward Heath reports current alcohol use. Edward Heath reports that Edward Heath does not use drugs.  Current Outpatient Medications on File Prior to Visit  Medication Sig Dispense Refill   acetaminophen (TYLENOL) 500 MG tablet Take 500 mg by mouth 3 (three) times daily as needed.     amiodarone (PACERONE) 200 MG tablet Take 200 mg by mouth daily.     apixaban (ELIQUIS) 5 MG TABS tablet Take 1 tablet (5 mg total) by mouth 2 (two) times daily. 2 BID X 7 days then 1 BID (Patient taking differently: Take 5 mg by mouth 2 (two) times daily.) 75 tablet 0   atorvastatin (LIPITOR) 80 MG tablet Take 40 mg by mouth daily.     Carboxymethylcellulose Sodium 0.25 % SOLN Apply 1 drop to eye in the morning, at noon, in the evening, and at bedtime.     cyanocobalamin 1000 MCG tablet Take 1,000 mcg by mouth daily.     empagliflozin (JARDIANCE) 25 MG TABS tablet Take 12.5 mg by mouth daily.     ferrous sulfate 325 (65 FE) MG tablet Take 325 mg by mouth daily with breakfast.      furosemide (LASIX) 40 MG tablet Take 1 tablet (40 mg total) by mouth 2 (two) times daily. (Patient taking differently: Take 40 mg by mouth daily.) 60 tablet 0   HYDROcodone-acetaminophen (NORCO) 7.5-325 MG tablet Take 1 tablet by mouth every 12 (twelve) hours as needed. 30 tablet 0   insulin aspart (NOVOLOG FLEXPEN) 100 UNIT/ML FlexPen Inject 20 Units into the skin in the morning and at bedtime.  levothyroxine (SYNTHROID) 137 MCG tablet Take 137 mcg by mouth daily before breakfast.     loratadine (CLARITIN) 10 MG tablet Take 10 mg by mouth daily.     Magnesium Oxide 420 MG TABS Take 420 mg by mouth in the morning and at bedtime.     melatonin 3 MG TABS tablet Take 12 mg by mouth at bedtime.     metFORMIN  (GLUCOPHAGE) 1000 MG tablet Take 1,000 mg by mouth 2 (two) times daily with a meal.      Omega-3 Fatty Acids (FISH OIL) 1000 MG CAPS Take 1,000 mg by mouth daily.     omeprazole (PRILOSEC) 20 MG capsule Take 20 mg by mouth 2 (two) times daily.     Oxcarbazepine (TRILEPTAL) 300 MG tablet Take 300 mg by mouth 2 (two) times daily. '300MG'$  AT 2PM AND '600MG'$  QHS     rasagiline (AZILECT) 0.5 MG TABS tablet Take 1 mg by mouth daily.     sacubitril-valsartan (ENTRESTO) 24-26 MG Take 0.5 tablets by mouth 2 (two) times daily.     spironolactone (ALDACTONE) 25 MG tablet Take 25 mg by mouth daily.     Vitamin D, Cholecalciferol, 10 MCG (400 UNIT) TABS Take 400 Units by mouth daily.     vitamin E 180 MG (400 UNITS) capsule Take 400 Units by mouth daily.     nitroGLYCERIN (NITROSTAT) 0.4 MG SL tablet Place 0.4 mg under the tongue every 5 (five) minutes as needed for chest pain.      No current facility-administered medications on file prior to visit.    ROS Review of Systems  Constitutional:  Negative for fever.  Eyes:  Positive for visual disturbance (seeing double. Onset about a week ago.). Negative for pain.  Respiratory:  Negative for shortness of breath.   Cardiovascular:  Negative for chest pain.  Musculoskeletal:  Negative for arthralgias.  Skin:  Negative for rash.    Objective:  BP (!) 100/53   Pulse 78   Temp (!) 97.5 F (36.4 C)   Ht '5\' 11"'$  (1.803 m)   Wt 206 lb 3.2 oz (93.5 kg)   SpO2 94%   BMI 28.76 kg/m   BP Readings from Last 3 Encounters:  07/20/22 (!) 100/53  01/17/22 105/60  05/03/21 (!) 107/58    Wt Readings from Last 3 Encounters:  07/20/22 206 lb 3.2 oz (93.5 kg)  03/23/22 213 lb (96.6 kg)  01/17/22 213 lb 3.2 oz (96.7 kg)     Physical Exam Vitals reviewed.  Constitutional:      Appearance: Edward Heath is well-developed.  HENT:     Head: Normocephalic and atraumatic.     Right Ear: External ear normal.     Left Ear: External ear normal.     Mouth/Throat:      Pharynx: No oropharyngeal exudate or posterior oropharyngeal erythema.  Eyes:     Pupils: Pupils are equal, round, and reactive to light.     Comments: When covering the left eye Edward Heath is seeing two images with the right eye.  Cardiovascular:     Rate and Rhythm: Normal rate and regular rhythm.     Heart sounds: No murmur heard. Pulmonary:     Effort: No respiratory distress.     Breath sounds: Normal breath sounds.  Musculoskeletal:     Cervical back: Normal range of motion and neck supple.  Neurological:     Mental Status: Edward Heath is alert and oriented to person, place, and time.     Coordination:  Coordination abnormal.     Diabetic Foot Exam - Simple   No data filed     Lab Results  Component Value Date   HGBA1C 6.6 (H) 01/17/2022   HGBA1C 6.5 06/03/2020   HGBA1C 8.3 (H) 09/03/2019    Assessment & Plan:   Lesly was seen today for medical management of chronic issues.  Diagnoses and all orders for this visit:  Type 2 diabetes mellitus with other circulatory complication, with long-term current use of insulin (HCC) -     Bayer DCA Hb A1c Waived  Mixed hyperlipidemia -     Lipid panel  Elevated serum free T4 level -     TSH + free T4  Hypertension, unspecified type -     CBC with Differential/Platelet -     CMP14+EGFR  Monocular diplopia of right eye  Other orders -     Continuous Blood Gluc Sensor (DEXCOM G7 SENSOR) MISC; Use to check glucose for mealtime, fasting and bedtime -     Continuous Blood Gluc Receiver ([place]) DEVI; Use to record glucose readings QID and as needed   I have discontinued [employer] Free[employer] Libre 2 Reader and [employer]. I am also having Edward Heath start on Dexcom G7 Sensor and Dexcom G7 Receiver. Additionally, I am having Edward Heath maintain his omeprazole, atorvastatin, metFORMIN, nitroGLYCERIN, apixaban, ferrous sulfate, cyanocobalamin, amiodarone, furosemide, loratadine, NovoLOG FlexPen, sacubitril-valsartan,  melatonin, Oxcarbazepine, rasagiline, spironolactone, vitamin E, acetaminophen, Vitamin D (Cholecalciferol), empagliflozin, Fish Oil, levothyroxine, Magnesium Oxide, Carboxymethylcellulose Sodium, and HYDROcodone-acetaminophen.  Meds ordered this encounter  Medications   Continuous Blood Gluc Sensor (DEXCOM G7 SENSOR) MISC    Sig: Use to check glucose for mealtime, fasting and bedtime    Dispense:  6 each    Refill:  3   Continuous Blood Gluc Receiver (DEXCOM G7 RECEIVER) DEVI    Sig: Use to record glucose readings QID and as needed    Dispense:  1 each    Refill:  PRN   Pt. Agrees to call his [place] ophthalmologist as soon as Edward Heath leaves here today.   Follow-up: Return in about 3 months (around 10/18/2022).  Claretta Fraise, M.D.

## 2022-07-21 LAB — CMP14+EGFR
ALT: 12 IU/L (ref 0–44)
AST: 16 IU/L (ref 0–40)
Albumin/Globulin Ratio: 1.6 (ref 1.2–2.2)
Albumin: 4 g/dL (ref 3.8–4.8)
Alkaline Phosphatase: 111 IU/L (ref 44–121)
BUN/Creatinine Ratio: 15 (ref 10–24)
BUN: 25 mg/dL (ref 8–27)
Bilirubin Total: 0.4 mg/dL (ref 0.0–1.2)
CO2: 22 mmol/L (ref 20–29)
Calcium: 9.3 mg/dL (ref 8.6–10.2)
Chloride: 98 mmol/L (ref 96–106)
Creatinine, Ser: 1.72 mg/dL — ABNORMAL HIGH (ref 0.76–1.27)
Globulin, Total: 2.5 g/dL (ref 1.5–4.5)
Glucose: 233 mg/dL — ABNORMAL HIGH (ref 70–99)
Potassium: 4.5 mmol/L (ref 3.5–5.2)
Sodium: 140 mmol/L (ref 134–144)
Total Protein: 6.5 g/dL (ref 6.0–8.5)
eGFR: 40 mL/min/{1.73_m2} — ABNORMAL LOW (ref >59)

## 2022-07-21 LAB — LIPID PANEL
Chol/HDL Ratio: 5.1 ratio — ABNORMAL HIGH (ref 0.0–5.0)
Cholesterol, Total: 210 mg/dL — ABNORMAL HIGH (ref 100–199)
HDL: 41 mg/dL (ref >39)
LDL Chol Calc (NIH): 128 mg/dL — ABNORMAL HIGH (ref 0–99)
Triglycerides: 232 mg/dL — ABNORMAL HIGH (ref 0–149)
VLDL Cholesterol Cal: 41 mg/dL — ABNORMAL HIGH (ref 5–40)

## 2022-07-21 LAB — TSH+FREE T4
Free T4: 1.4 ng/dL (ref 0.82–1.77)
TSH: 1.74 u[IU]/mL (ref 0.450–4.500)

## 2022-07-21 LAB — CBC WITH DIFFERENTIAL/PLATELET
Basophils Absolute: 0.1 10*3/uL (ref 0.0–0.2)
Basos: 1 %
EOS (ABSOLUTE): 0.2 10*3/uL (ref 0.0–0.4)
Eos: 2 %
Hematocrit: 41.6 % (ref 37.5–51.0)
Hemoglobin: 14.1 g/dL (ref 13.0–17.7)
Immature Grans (Abs): 0 10*3/uL (ref 0.0–0.1)
Immature Granulocytes: 0 %
Lymphocytes Absolute: 2.2 10*3/uL (ref 0.7–3.1)
Lymphs: 23 %
MCH: 30.4 pg (ref 26.6–33.0)
MCHC: 33.9 g/dL (ref 31.5–35.7)
MCV: 90 fL (ref 79–97)
Monocytes Absolute: 0.6 10*3/uL (ref 0.1–0.9)
Monocytes: 6 %
Neutrophils Absolute: 6.6 10*3/uL (ref 1.4–7.0)
Neutrophils: 68 %
Platelets: 306 10*3/uL (ref 150–450)
RBC: 4.64 x10E6/uL (ref 4.14–5.80)
RDW: 12 % (ref 11.6–15.4)
WBC: 9.6 10*3/uL (ref 3.4–10.8)

## 2022-08-14 ENCOUNTER — Other Ambulatory Visit: Payer: Self-pay | Admitting: Family Medicine

## 2022-10-13 ENCOUNTER — Ambulatory Visit (INDEPENDENT_AMBULATORY_CARE_PROVIDER_SITE_OTHER): Payer: Medicare Other

## 2022-10-13 VITALS — Ht 70.0 in | Wt 200.0 lb

## 2022-10-13 DIAGNOSIS — Z0000 Encounter for general adult medical examination without abnormal findings: Secondary | ICD-10-CM | POA: Diagnosis not present

## 2022-10-13 NOTE — Patient Instructions (Signed)
Edward Heath , Thank you for taking time to come for your Medicare Wellness Visit. I appreciate your ongoing commitment to your health goals. Please review the following plan we discussed and let me know if I can assist you in the future.   These are the goals we discussed:  Goals      Exercise 3x per week (30 min per time)     Exercise at the [facility] or walk for 1 hour 3 times per week.     Weight (lb) < 225 lb (102.1 kg)     Initially would like to see decreased in weight of 10%        This is a list of the screening recommended for you and due dates:  Health Maintenance  Topic Date Due   Eye exam for diabetics  Never done   DTaP/Tdap/Td vaccine (1 - Tdap) Never done   Complete foot exam   12/20/2017   COVID-19 Vaccine (3 - Moderna risk series) 08/11/2020   Zoster (Shingles) Vaccine (1 of 2) 10/18/2022*   Hepatitis C Screening  01/18/2023*   Flu Shot  12/21/2022   Yearly kidney health urinalysis for diabetes  01/18/2023   Hemoglobin A1C  01/18/2023   Yearly kidney function blood test for diabetes  07/20/2023   Medicare Annual Wellness Visit  10/13/2023   DEXA scan (bone density measurement)  02/02/2026   Pneumonia Vaccine  Completed   HPV Vaccine  Aged Out  *Topic was postponed. The date shown is not the original due date.    Advanced directives: Advance directive discussed with you today. I have provided a copy for you to complete at home and have notarized. Once this is complete please bring a copy in to our office so we can scan it into your chart.   Conditions/risks identified: Aim for 30 minutes of exercise or brisk walking, 6-8 glasses of water, and 5 servings of fruits and vegetables each day.   Next appointment: Follow up in one year for your annual wellness visit.   Preventive Care 14 Years and Older, Male  Preventive care refers to lifestyle choices and visits with your health care provider that can promote health and wellness. What does preventive care include? A  yearly physical exam. This is also called an annual well check. Dental exams once or twice a year. Routine eye exams. Ask your health care provider how often you should have your eyes checked. Personal lifestyle choices, including: Daily care of your teeth and gums. Regular physical activity. Eating a healthy diet. Avoiding tobacco and drug use. Limiting alcohol use. Practicing safe sex. Taking low doses of aspirin every day. Taking vitamin and mineral supplements as recommended by your health care provider. What happens during an annual well check? The services and screenings done by your health care provider during your annual well check will depend on your age, overall health, lifestyle risk factors, and family history of disease. Counseling  Your health care provider may ask you questions about your: Alcohol use. Tobacco use. Drug use. Emotional well-being. Home and relationship well-being. Sexual activity. Eating habits. History of falls. Memory and ability to understand (cognition). Work and work Astronomer. Screening  You may have the following tests or measurements: Height, weight, and BMI. Blood pressure. Lipid and cholesterol levels. These may be checked every 5 years, or more frequently if you are over 21 years old. Skin check. Lung cancer screening. You may have this screening every year starting at age 61 if you have  a 30-pack-year history of smoking and currently smoke or have quit within the past 15 years. Fecal occult blood test (FOBT) of the stool. You may have this test every year starting at age 26. Flexible sigmoidoscopy or colonoscopy. You may have a sigmoidoscopy every 5 years or a colonoscopy every 10 years starting at age 24. Prostate cancer screening. Recommendations will vary depending on your family history and other risks. Hepatitis C blood test. Hepatitis B blood test. Sexually transmitted disease (STD) testing. Diabetes screening. This is done by  checking your blood sugar (glucose) after you have not eaten for a while (fasting). You may have this done every 1-3 years. Abdominal aortic aneurysm (AAA) screening. You may need this if you are a current or former smoker. Osteoporosis. You may be screened starting at age 18 if you are at high risk. Talk with your health care provider about your test results, treatment options, and if necessary, the need for more tests. Vaccines  Your health care provider may recommend certain vaccines, such as: Influenza vaccine. This is recommended every year. Tetanus, diphtheria, and acellular pertussis (Tdap, Td) vaccine. You may need a Td booster every 10 years. Zoster vaccine. You may need this after age 47. Pneumococcal 13-valent conjugate (PCV13) vaccine. One dose is recommended after age 53. Pneumococcal polysaccharide (PPSV23) vaccine. One dose is recommended after age 30. Talk to your health care provider about which screenings and vaccines you need and how often you need them. This information is not intended to replace advice given to you by your health care provider. Make sure you discuss any questions you have with your health care provider. Document Released: 06/04/2015 Document Revised: 01/26/2016 Document Reviewed: 03/09/2015 Elsevier Interactive Patient Education  2017 [employer].  Fall Prevention in the Home Falls can cause injuries. They can happen to people of all ages. There are many things you can do to make your home safe and to help prevent falls. What can I do on the outside of my home? Regularly fix the edges of walkways and driveways and fix any cracks. Remove anything that might make you trip as you walk through a door, such as a raised step or threshold. Trim any bushes or trees on the path to your home. Use bright outdoor lighting. Clear any walking paths of anything that might make someone trip, such as rocks or tools. Regularly check to see if handrails are loose or  broken. Make sure that both sides of any steps have handrails. Any raised decks and porches should have guardrails on the edges. Have any leaves, snow, or ice cleared regularly. Use sand or salt on walking paths during winter. Clean up any spills in your garage right away. This includes oil or grease spills. What can I do in the bathroom? Use night lights. Install grab bars by the toilet and in the tub and shower. Do not use towel bars as grab bars. Use non-skid mats or decals in the tub or shower. If you need to sit down in the shower, use a plastic, non-slip stool. Keep the floor dry. Clean up any water that spills on the floor as soon as it happens. Remove soap buildup in the tub or shower regularly. Attach bath mats securely with double-sided non-slip rug tape. Do not have throw rugs and other things on the floor that can make you trip. What can I do in the bedroom? Use night lights. Make sure that you have a light by your bed that is easy to  reach. Do not use any sheets or blankets that are too big for your bed. They should not hang down onto the floor. Have a firm chair that has side arms. You can use this for support while you get dressed. Do not have throw rugs and other things on the floor that can make you trip. What can I do in the kitchen? Clean up any spills right away. Avoid walking on wet floors. Keep items that you use a lot in easy-to-reach places. If you need to reach something above you, use a strong step stool that has a grab bar. Keep electrical cords out of the way. Do not use floor polish or wax that makes floors slippery. If you must use wax, use non-skid floor wax. Do not have throw rugs and other things on the floor that can make you trip. What can I do with my stairs? Do not leave any items on the stairs. Make sure that there are handrails on both sides of the stairs and use them. Fix handrails that are broken or loose. Make sure that handrails are as long as  the stairways. Check any carpeting to make sure that it is firmly attached to the stairs. Fix any carpet that is loose or worn. Avoid having throw rugs at the top or bottom of the stairs. If you do have throw rugs, attach them to the floor with carpet tape. Make sure that you have a light switch at the top of the stairs and the bottom of the stairs. If you do not have them, ask someone to add them for you. What else can I do to help prevent falls? Wear shoes that: Do not have high heels. Have rubber bottoms. Are comfortable and fit you well. Are closed at the toe. Do not wear sandals. If you use a stepladder: Make sure that it is fully opened. Do not climb a closed stepladder. Make sure that both sides of the stepladder are locked into place. Ask someone to hold it for you, if possible. Clearly mark and make sure that you can see: Any grab bars or handrails. First and last steps. Where the edge of each step is. Use tools that help you move around (mobility aids) if they are needed. These include: Canes. Walkers. Scooters. Crutches. Turn on the lights when you go into a dark area. Replace any light bulbs as soon as they burn out. Set up your furniture so you have a clear path. Avoid moving your furniture around. If any of your floors are uneven, fix them. If there are any pets around you, be aware of where they are. Review your medicines with your doctor. Some medicines can make you feel dizzy. This can increase your chance of falling. Ask your doctor what other things that you can do to help prevent falls. This information is not intended to replace advice given to you by your health care provider. Make sure you discuss any questions you have with your health care provider. Document Released: 03/04/2009 Document Revised: 10/14/2015 Document Reviewed: 06/12/2014 Elsevier Interactive Patient Education  2017 [employer].

## 2022-10-13 NOTE — Progress Notes (Signed)
Subjective:   Edward Heath is a 77 y.o. male who presents for Medicare Annual/Subsequent preventive examination. I connected with  Savaughn D Leggette on 10/13/22 by a audio enabled telemedicine application and verified that I am speaking with the correct person using two identifiers.  Patient Location: Home  Provider Location: Home Office  I discussed the limitations of evaluation and management by telemedicine. The patient expressed understanding and agreed to proceed.  Review of Systems     Cardiac Risk Factors include: advanced age (>19men, >1 women);diabetes mellitus;dyslipidemia;male gender;hypertension     Objective:    Today's Vitals   10/13/22 1405  Weight: 200 lb (90.7 kg)  Height: 5\' 10"  (1.778 m)   Body mass index is 28.7 kg/m.     10/13/2022    2:08 PM 10/11/2021    2:59 PM 05/18/2021    2:58 PM 03/02/2021   10:42 AM 10/08/2020   10:53 AM 09/05/2019    9:41 AM 09/03/2019   10:42 AM  Advanced Directives  Does Patient Have a Medical Advance Directive? Yes Yes No Yes Yes No No  Type of Estate agent of [place];Living will Healthcare Power of [place];Living will  Healthcare Power of [place];Living will Healthcare Power of Attorney    Does patient want to make changes to medical advance directive?    No - Patient declined     Copy of Healthcare Power of Attorney in Chart? No - copy requested No - copy requested  No - copy requested No - copy requested    Would patient like information on creating a medical advance directive?      No - Patient declined No - Patient declined    Current Medications (verified) Outpatient Encounter Medications as of 10/13/2022  Medication Sig   acetaminophen (TYLENOL) 500 MG tablet Take 500 mg by mouth 3 (three) times daily as needed.   amiodarone (PACERONE) 200 MG tablet Take 200 mg by mouth daily.   apixaban (ELIQUIS) 5 MG TABS tablet Take 1 tablet (5 mg total) by mouth 2 (two) times daily. 2 BID X 7 days then  1 BID (Patient taking differently: Take 5 mg by mouth 2 (two) times daily.)   atorvastatin (LIPITOR) 80 MG tablet Take 40 mg by mouth daily.   Carboxymethylcellulose Sodium 0.25 % SOLN Apply 1 drop to eye in the morning, at noon, in the evening, and at bedtime.   Continuous Blood Gluc Receiver (DEXCOM G7 RECEIVER) DEVI Use to record glucose readings QID and as needed   Continuous Blood Gluc Sensor (FREESTYLE LIBRE 2 SENSOR) MISC USE AS DIRECTED, CHANGING EVERY 14 DAYS   cyanocobalamin 1000 MCG tablet Take 1,000 mcg by mouth daily.   empagliflozin (JARDIANCE) 25 MG TABS tablet Take 12.5 mg by mouth daily.   ferrous sulfate 325 (65 FE) MG tablet Take 325 mg by mouth daily with breakfast.    furosemide (LASIX) 40 MG tablet Take 1 tablet (40 mg total) by mouth 2 (two) times daily. (Patient taking differently: Take 40 mg by mouth daily.)   HYDROcodone-acetaminophen (NORCO) 7.5-325 MG tablet Take 1 tablet by mouth every 12 (twelve) hours as needed.   insulin aspart (NOVOLOG FLEXPEN) 100 UNIT/ML FlexPen Inject 20 Units into the skin in the morning and at bedtime.   levothyroxine (SYNTHROID) 137 MCG tablet Take 137 mcg by mouth daily before breakfast.   loratadine (CLARITIN) 10 MG tablet Take 10 mg by mouth daily.   Magnesium Oxide 420 MG TABS Take 420 mg by mouth  in the morning and at bedtime.   melatonin 3 MG TABS tablet Take 12 mg by mouth at bedtime.   metFORMIN (GLUCOPHAGE) 1000 MG tablet Take 1,000 mg by mouth 2 (two) times daily with a meal.    Omega-3 Fatty Acids (FISH OIL) 1000 MG CAPS Take 1,000 mg by mouth daily.   omeprazole (PRILOSEC) 20 MG capsule Take 20 mg by mouth 2 (two) times daily.   Oxcarbazepine (TRILEPTAL) 300 MG tablet Take 300 mg by mouth 2 (two) times daily. 300MG  AT 2PM AND 600MG  QHS   rasagiline (AZILECT) 0.5 MG TABS tablet Take 1 mg by mouth daily.   sacubitril-valsartan (ENTRESTO) 24-26 MG Take 0.5 tablets by mouth 2 (two) times daily.   spironolactone (ALDACTONE) 25 MG  tablet Take 25 mg by mouth daily.   Vitamin D, Cholecalciferol, 10 MCG (400 UNIT) TABS Take 400 Units by mouth daily.   vitamin E 180 MG (400 UNITS) capsule Take 400 Units by mouth daily.   nitroGLYCERIN (NITROSTAT) 0.4 MG SL tablet Place 0.4 mg under the tongue every 5 (five) minutes as needed for chest pain.    No facility-administered encounter medications on file as of 10/13/2022.    Allergies (verified) Patient has no known allergies.   History: Past Medical History:  Diagnosis Date   AICD (automatic cardioverter/defibrillator) present    Arthritis    CHF (congestive heart failure) (HCC)    Coronary artery disease    Diabetes mellitus without complication (HCC)    Dysrhythmia    GERD (gastroesophageal reflux disease)    Heart attack (HCC) 2004, 2015   History of kidney stones    Hyperlipidemia    Hypertension    Hypothyroidism    Lumbar vertebral fracture (HCC) 2012   NSTEMI, initial episode of care (HCC) 10/09/2013   Skin cancer 06/2014   left cheek   Stroke [facility])    x3   Thyroid disease    TIA (transient ischemic attack) 2009   Past Surgical History:  Procedure Laterality Date   CARDIAC DEFIBRILLATOR PLACEMENT  12/2003, Repaired in 2012   x3   KNEE SURGERY Right 1994   SKIN CANCER EXCISION  06/2014   had 4 removed   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   Procedure: LEFT ELBOW ULNAR NERVE DECOMPRESSION;  Surgeon: Eldred Manges, MD;  Location: [facility] OR;  Service: Orthopedics;  Laterality: Left;   Family History  Problem Relation Age of Onset   Heart attack Mother    Aneurysm Father    Heart attack Brother 3   Social History   Socioeconomic History   Marital status: Married    Spouse name: Bonita Quin   Number of children: 2   Years of education: Not on file   Highest education level: Not on file  Occupational History   Occupation: retired    Comment: [employer]  Tobacco Use   Smoking status: Never   Smokeless tobacco: Current    Types: Associate Professor  Use: Never used  Substance and Sexual Activity   Alcohol use: Yes    Comment: occ   Drug use: No   Sexual activity: Yes  Other Topics Concern   Not on file  Social History Narrative   Lives home with wife - daughter and son stay sometimes - one granddaughter lives about an hour away.   Enjoys golfing      [facility]   Social Determinants of Health   Financial Resource Strain: Low Risk  (10/13/2022)  Overall Financial Resource Strain (CARDIA)    Difficulty of Paying Living Expenses: Not hard at all  Food Insecurity: No Food Insecurity (10/13/2022)   Hunger Vital Sign    Worried About Running Out of Food in the Last Year: Never true    Ran Out of Food in the Last Year: Never true  Transportation Needs: No Transportation Needs (10/13/2022)   PRAPARE - Administrator, Civil Service (Medical): No    Lack of Transportation (Non-Medical): No  Physical Activity: Insufficiently Active (10/13/2022)   Exercise Vital Sign    Days of Exercise per Week: 3 days    Minutes of Exercise per Session: 30 min  Stress: No Stress Concern Present (10/13/2022)   [employer] of Occupational Health - Occupational Stress Questionnaire    Feeling of Stress : Not at all  Social Connections: Moderately Integrated (10/13/2022)   Social Connection and Isolation Panel [NHANES]    Frequency of Communication with Friends and Family: More than three times a week    Frequency of Social Gatherings with Friends and Family: More than three times a week    Attends Religious Services: More than 4 times per year    Active Member of [employer] or Organizations: No    Attends Engineer, structural: Never    Marital Status: Married    Tobacco Counseling Ready to quit: Not Answered Counseling given: Not Answered   Clinical Intake:  Pre-visit preparation completed: Yes  Pain : No/denies pain     Nutritional Risks: None Diabetes: Yes CBG done?: No Did pt. bring in CBG monitor from home?:  No  How often do you need to have someone help you when you read instructions, pamphlets, or other written materials from your doctor or pharmacy?: 1 - Never  Diabetic?yes  Nutrition Risk Assessment:  Has the patient had any N/V/D within the last 2 months?  No  Does the patient have any non-healing wounds?  No  Has the patient had any unintentional weight loss or weight gain?  No   Diabetes:  Is the patient diabetic?  Yes  If diabetic, was a CBG obtained today?  No  Did the patient bring in their glucometer from home?  No  How often do you monitor your CBG's? LIBRE.   Financial Strains and Diabetes Management:  Are you having any financial strains with the device, your supplies or your medication? No .  Does the patient want to be seen by Chronic Care Management for management of their diabetes?  No  Would the patient like to be referred to a Nutritionist or for Diabetic Management?  No   Diabetic Exams:  Diabetic Eye Exam: Completed 03/2023 Diabetic Foot Exam: Overdue, Pt has been advised about the importance in completing this exam. Pt is scheduled for diabetic foot exam on next office visit .   Interpreter Needed?: No  Information entered by :: Renie Ora, LPN   Activities of Daily Living    10/13/2022    2:08 PM  In your present state of health, do you have any difficulty performing the following activities:  Hearing? 0  Vision? 0  Difficulty concentrating or making decisions? 0  Walking or climbing stairs? 0  Dressing or bathing? 0  Doing errands, shopping? 0  Preparing Food and eating ? N  Using the Toilet? N  In the past six months, have you accidently leaked urine? N  Do you have problems with loss of bowel control? N  Managing your Medications?  N  Managing your Finances? N  Housekeeping or managing your Housekeeping? N    Patient Care Team: Mechele Claude, MD as PCP - General (Family Medicine) Basilio Cairo, MD as Consulting Physician  (Dermatology) Gloriann Loan, MD as Consulting Physician (Urology) Amador Cunas, MD as Referring Physician (Diagnostic Radiology)  Indicate any recent Medical Services you may have received from other than [facility] providers in the past year (date may be approximate).     Assessment:   This is a routine wellness examination for [place].  Hearing/Vision screen Vision Screening - Comments:: Wears rx glasses - [facility]  Dietary issues and exercise activities discussed: Current Exercise Habits: Home exercise routine, Type of exercise: walking, Time (Minutes): 30, Frequency (Times/Week): 3, Weekly Exercise (Minutes/Week): 90, Intensity: Mild, Exercise limited by: orthopedic condition(s)   Goals Addressed             This Visit's Progress    Exercise 3x per week (30 min per time)   On track    Exercise at the [facility] or walk for 1 hour 3 times per week.       Depression Screen    10/13/2022    2:07 PM 07/20/2022    9:16 AM 01/17/2022    8:48 AM 10/11/2021    2:57 PM 05/03/2021    1:29 PM 11/01/2020   12:51 PM 10/08/2020   10:44 AM  PHQ 2/9 Scores  PHQ - 2 Score 0 0 0 0 0 0 0    Fall Risk    10/13/2022    2:07 PM 07/20/2022    9:16 AM 01/17/2022    8:48 AM 10/11/2021    2:53 PM 05/03/2021    1:33 PM  Fall Risk   Falls in the past year? 0 0 0 1 1  Number falls in past yr: 0   1 1  Injury with Fall? 0   0 0  Risk for fall due to : No Fall Risks   History of fall(s);Orthopedic patient History of fall(s);Impaired balance/gait;Impaired mobility  Follow up Falls prevention discussed   Falls prevention discussed;Education provided Falls evaluation completed    FALL RISK PREVENTION PERTAINING TO THE HOME:  Any stairs in or around the home? No  If so, are there any without handrails? No  Home free of loose throw rugs in walkways, pet beds, electrical cords, etc? No  Adequate lighting in your home to reduce risk of falls? No   ASSISTIVE  DEVICES UTILIZED TO PREVENT FALLS:  Life alert? Yes  Use of a cane, walker or w/c? No  Grab bars in the bathroom? Yes  Shower chair or bench in shower? Yes  Elevated toilet seat or a handicapped toilet? Yes       03/25/2018    4:24 PM 12/20/2016   10:19 AM 12/14/2015    9:57 AM 08/05/2014    4:32 PM  MMSE - Mini Mental State Exam  Orientation to time 5 5 5 5   Orientation to Place 5 5 5 5   Registration 3 3 3 3   Attention/ Calculation 4 4 4 4   Recall 3 3 2 2   Language- name 2 objects 2 2 2 2   Language- repeat 1 1 1 1   Language- follow 3 step command 3 3 3 3   Language- read & follow direction 1 1 1 1   Write a sentence 1 1 1 1   Copy design 1 1 0 1  Total score 29  29 27 28         10/13/2022    2:09 PM 10/11/2021    3:01 PM  6CIT Screen  What Year? 0 points 0 points  What month? 0 points 0 points  What time? 0 points 0 points  Count back from 20 0 points 0 points  Months in reverse 0 points 0 points  Repeat phrase 0 points 4 points  Total Score 0 points 4 points    Immunizations Immunization History  Administered Date(s) Administered   Influenza Split 04/18/2017   Influenza, High Dose Seasonal PF 03/06/2013, 01/26/2015   Influenza,inj,quad, With Preservative 02/21/2019   Influenza-Unspecified 03/22/2014, 02/17/2016, 02/21/2019   Moderna SARS-COV2 Booster Vaccination 07/14/2020   Moderna Sars-Covid-2 Vaccination 07/02/2019, 07/30/2019   Pneumococcal Conjugate-13 03/22/2014   Pneumococcal Polysaccharide-23 03/22/2013, 06/05/2013   Zoster, Live 11/19/2013    TDAP status: Due, Education has been provided regarding the importance of this vaccine. Advised may receive this vaccine at local pharmacy or Health Dept. Aware to provide a copy of the vaccination record if obtained from local pharmacy or Health Dept. Verbalized acceptance and understanding.  Flu Vaccine status: Declined, Education has been provided regarding the importance of this vaccine but patient still declined.  Advised may receive this vaccine at local pharmacy or Health Dept. Aware to provide a copy of the vaccination record if obtained from local pharmacy or Health Dept. Verbalized acceptance and understanding.  Pneumococcal vaccine status: Up to date  Covid-19 vaccine status: Completed vaccines  Qualifies for Shingles Vaccine? Yes   Zostavax completed No   Shingrix Completed?: No.    Education has been provided regarding the importance of this vaccine. Patient has been advised to call insurance company to determine out of pocket expense if they have not yet received this vaccine. Advised may also receive vaccine at local pharmacy or Health Dept. Verbalized acceptance and understanding.  Screening Tests Health Maintenance  Topic Date Due   OPHTHALMOLOGY EXAM  Never done   DTaP/Tdap/Td (1 - Tdap) Never done   FOOT EXAM  12/20/2017   COVID-19 Vaccine (3 - Moderna risk series) 08/11/2020   Zoster Vaccines- Shingrix (1 of 2) 10/18/2022 (Originally 12/10/1964)   Hepatitis C Screening  01/18/2023 (Originally 12/10/1963)   INFLUENZA VACCINE  12/21/2022   Diabetic kidney evaluation - Urine ACR  01/18/2023   HEMOGLOBIN A1C  01/18/2023   Diabetic kidney evaluation - eGFR measurement  07/20/2023   Medicare Annual Wellness (AWV)  10/13/2023   DEXA SCAN  02/02/2026   Pneumonia Vaccine 48+ Years old  Completed   HPV VACCINES  Aged Out    Health Maintenance  Health Maintenance Due  Topic Date Due   OPHTHALMOLOGY EXAM  Never done   DTaP/Tdap/Td (1 - Tdap) Never done   FOOT EXAM  12/20/2017   COVID-19 Vaccine (3 - Moderna risk series) 08/11/2020    Colorectal cancer screening: No longer required.   Lung Cancer Screening: (Low Dose CT Chest recommended if Age 56-80 years, 30 pack-year currently smoking OR have quit w/in 15years.) does not qualify.   Lung Cancer Screening Referral: n/a   Additional Screening:  Hepatitis C Screening: does not qualify;   Vision Screening: Recommended annual  ophthalmology exams for early detection of glaucoma and other disorders of the eye. Is the patient up to date with their annual eye exam?  Yes  Who is the provider or what is the name of the [facility] the [facility] If [facility] is not  established with a provider, would they like to be referred to a provider to establish care? No .   Dental Screening: Recommended annual dental exams for proper oral hygiene  Community Resource Referral / Chronic Care Management: CRR required this visit?  No   CCM required this visit?  No      Plan:     I have personally reviewed and noted the following in the patient's chart:   Medical and social history Use of alcohol, tobacco or illicit drugs  Current medications and supplements including opioid prescriptions. Patient is not currently taking opioid prescriptions. Functional ability and status Nutritional status Physical activity Advanced directives List of other physicians Hospitalizations, surgeries, and ER visits in previous 12 months Vitals Screenings to include cognitive, depression, and falls Referrals and appointments  In addition, I have reviewed and discussed with patient certain preventive protocols, quality metrics, and best practice recommendations. A written personalized care plan for preventive services as well as general preventive health recommendations were provided to patient.     Lorrene Reid, LPN   [id number]/61/0960   Nurse Notes: Due Tdap Vaccine states he will obtain [facility]

## 2022-10-18 ENCOUNTER — Encounter: Payer: Self-pay | Admitting: Family Medicine

## 2022-10-18 ENCOUNTER — Ambulatory Visit (INDEPENDENT_AMBULATORY_CARE_PROVIDER_SITE_OTHER): Payer: Medicare Other | Admitting: Family Medicine

## 2022-10-18 VITALS — BP 94/48 | HR 70 | Temp 97.4°F | Ht 70.0 in | Wt 202.6 lb

## 2022-10-18 DIAGNOSIS — E782 Mixed hyperlipidemia: Secondary | ICD-10-CM | POA: Diagnosis not present

## 2022-10-18 DIAGNOSIS — Z794 Long term (current) use of insulin: Secondary | ICD-10-CM | POA: Diagnosis not present

## 2022-10-18 DIAGNOSIS — I129 Hypertensive chronic kidney disease with stage 1 through stage 4 chronic kidney disease, or unspecified chronic kidney disease: Secondary | ICD-10-CM

## 2022-10-18 DIAGNOSIS — E1159 Type 2 diabetes mellitus with other circulatory complications: Secondary | ICD-10-CM | POA: Diagnosis not present

## 2022-10-18 DIAGNOSIS — N1832 Chronic kidney disease, stage 3b: Secondary | ICD-10-CM

## 2022-10-18 DIAGNOSIS — I10 Essential (primary) hypertension: Secondary | ICD-10-CM

## 2022-10-18 DIAGNOSIS — R7989 Other specified abnormal findings of blood chemistry: Secondary | ICD-10-CM

## 2022-10-18 LAB — BAYER DCA HB A1C WAIVED: HB A1C (BAYER DCA - WAIVED): 7.1 % — ABNORMAL HIGH (ref 4.8–5.6)

## 2022-10-18 MED ORDER — OXCARBAZEPINE 300 MG PO TABS: ORAL_TABLET | ORAL | 1 refills | Status: AC

## 2022-10-18 MED ORDER — FUROSEMIDE 40 MG PO TABS: 40.0000 mg | ORAL_TABLET | Freq: Every day | ORAL | 1 refills | Status: DC

## 2022-10-18 NOTE — Progress Notes (Signed)
Subjective:  Patient ID: Edward Heath,  male    DOB: 1945/10/13  Age: 77 y.o.    CC: Medical Management of Chronic Issues   HPI Edward Heath presents for  follow-up of hypertension. Patient has no history of headache chest pain or shortness of breath or recent cough. Patient also denies symptoms of TIA such as numbness weakness lateralizing. Patient denies side effects from medication. States taking it regularly.  Patient also  in for follow-up of elevated cholesterol. Doing well without complaints on current medication. Denies side effects  including myalgia and arthralgia and nausea. Also in today for liver function testing. Currently no chest pain, shortness of breath or other cardiovascular related symptoms noted.  Follow-up of diabetes. Patient does check blood sugar at home. Readings run between 70 and 200  Patient denies symptoms such as excessive hunger or urinary frequency, excessive hunger, nausea No significant hypoglycemic spells noted. Feet sting like yellow jackets stinging. Playing golf twice a week.  Medications reviewed. Pt reports taking them regularly. Pt. denies complication/adverse reaction today.    History Edward Heath has a past medical history of AICD (automatic cardioverter/defibrillator) present, Arthritis, CHF (congestive heart failure) (HCC), Coronary artery disease, Diabetes mellitus without complication (HCC), Dysrhythmia, GERD (gastroesophageal reflux disease), Heart attack (HCC) (2004, 2015), History of kidney stones, Hyperlipidemia, Hypertension, Hypothyroidism, Lumbar vertebral fracture (HCC) (2012), NSTEMI, initial episode of care [facility]) (10/09/2013), Skin cancer (06/2014), Stroke [facility]), Thyroid disease, and TIA (transient ischemic attack) (2009).   Edward Heath has a past surgical history that includes Cardiac defibrillator placement (12/2003, Repaired in 2012); Skin cancer excision (06/2014); Knee surgery (Right, 1994); and Ulnar nerve transposition (Left, 09/05/2019).    His family history includes Aneurysm in his father; Heart attack in his mother; Heart attack (age of onset: 82) in his brother.Edward Heath reports that Edward Heath has never smoked. His smokeless tobacco use includes chew. Edward Heath reports current alcohol use. Edward Heath reports that Edward Heath does not use drugs.  Current Outpatient Medications on File Prior to Visit  Medication Sig Dispense Refill   acetaminophen (TYLENOL) 500 MG tablet Take 500 mg by mouth 3 (three) times daily as needed.     amiodarone (PACERONE) 200 MG tablet Take 200 mg by mouth daily.     apixaban (ELIQUIS) 5 MG TABS tablet Take 1 tablet (5 mg total) by mouth 2 (two) times daily. 2 BID X 7 days then 1 BID (Patient taking differently: Take 5 mg by mouth 2 (two) times daily.) 75 tablet 0   atorvastatin (LIPITOR) 80 MG tablet Take 40 mg by mouth daily.     Carboxymethylcellulose Sodium 0.25 % SOLN Apply 1 drop to eye in the morning, at noon, in the evening, and at bedtime.     Continuous Blood Gluc Receiver (DEXCOM G7 RECEIVER) DEVI Use to record glucose readings QID and as needed 1 each PRN   Continuous Blood Gluc Sensor (FREESTYLE LIBRE 2 SENSOR) MISC USE AS DIRECTED, CHANGING EVERY 14 DAYS 6 each 3   cyanocobalamin 1000 MCG tablet Take 1,000 mcg by mouth daily.     empagliflozin (JARDIANCE) 25 MG TABS tablet Take 12.5 mg by mouth daily.     ferrous sulfate 325 (65 FE) MG tablet Take 325 mg by mouth daily with breakfast.      HYDROcodone-acetaminophen (NORCO) 7.5-325 MG tablet Take 1 tablet by mouth every 12 (twelve) hours as needed. 30 tablet 0   insulin aspart (NOVOLOG FLEXPEN) 100 UNIT/ML FlexPen Inject 20 Units into the skin in the morning and  at bedtime.     levothyroxine (SYNTHROID) 137 MCG tablet Take 137 mcg by mouth daily before breakfast.     loratadine (CLARITIN) 10 MG tablet Take 10 mg by mouth daily.     Magnesium Oxide 420 MG TABS Take 420 mg by mouth in the morning and at bedtime.     melatonin 3 MG TABS tablet Take 12 mg by mouth at bedtime.      metFORMIN (GLUCOPHAGE) 1000 MG tablet Take 1,000 mg by mouth 2 (two) times daily with a meal.      Omega-3 Fatty Acids (FISH OIL) 1000 MG CAPS Take 1,000 mg by mouth daily.     omeprazole (PRILOSEC) 20 MG capsule Take 20 mg by mouth 2 (two) times daily.     rasagiline (AZILECT) 0.5 MG TABS tablet Take 1 mg by mouth daily.     sacubitril-valsartan (ENTRESTO) 24-26 MG Take 0.5 tablets by mouth 2 (two) times daily.     spironolactone (ALDACTONE) 25 MG tablet Take 25 mg by mouth daily.     Vitamin D, Cholecalciferol, 10 MCG (400 UNIT) TABS Take 400 Units by mouth daily.     vitamin E 180 MG (400 UNITS) capsule Take 400 Units by mouth daily.     nitroGLYCERIN (NITROSTAT) 0.4 MG SL tablet Place 0.4 mg under the tongue every 5 (five) minutes as needed for chest pain.      No current facility-administered medications on file prior to visit.    ROS Review of Systems  Constitutional:  Negative for fever.  Respiratory:  Negative for shortness of breath.   Cardiovascular:  Negative for chest pain.  Musculoskeletal:  Negative for arthralgias.  Skin:  Negative for rash.    Objective:  BP (!) 94/48   Pulse 70   Temp (!) 97.4 F (36.3 C)   Ht 5\' 10"  (1.778 m)   Wt 202 lb 9.6 oz (91.9 kg)   SpO2 95%   BMI 29.07 kg/m   BP Readings from Last 3 Encounters:  10/18/22 (!) 94/48  07/20/22 (!) 100/53  01/17/22 105/60    Wt Readings from Last 3 Encounters:  10/18/22 202 lb 9.6 oz (91.9 kg)  10/13/22 200 lb (90.7 kg)  07/20/22 206 lb 3.2 oz (93.5 kg)     Physical Exam Vitals reviewed.  Constitutional:      Appearance: Edward Heath is well-developed.  HENT:     Head: Normocephalic and atraumatic.     Right Ear: External ear normal.     Left Ear: External ear normal.     Mouth/Throat:     Pharynx: No oropharyngeal exudate or posterior oropharyngeal erythema.  Eyes:     Pupils: Pupils are equal, round, and reactive to light.  Cardiovascular:     Rate and Rhythm: Normal rate and regular rhythm.      Heart sounds: No murmur heard. Pulmonary:     Effort: No respiratory distress.     Breath sounds: Normal breath sounds.  Musculoskeletal:     Cervical back: Normal range of motion and neck supple.  Neurological:     Mental Status: Edward Heath is alert and oriented to person, place, and time.     Diabetic Foot Exam - Simple   No data filed     Lab Results  Component Value Date   HGBA1C 7.1 (H) 10/18/2022   HGBA1C 7.6 (H) 07/20/2022   HGBA1C 6.6 (H) 01/17/2022    Assessment & Plan:   Vy was seen today for medical management of chronic  issues.  Diagnoses and all orders for this visit:  Type 2 diabetes mellitus with other circulatory complication, with long-term current use of insulin (HCC) -     Bayer DCA Hb A1c Waived  Mixed hyperlipidemia -     Lipid panel  Elevated serum free T4 level -     TSH + free T4  Hypertension, unspecified type -     CBC with Differential/Platelet -     CMP14+EGFR  Other orders -     furosemide (LASIX) 40 MG tablet; Take 1 tablet (40 mg total) by mouth daily. -     Oxcarbazepine (TRILEPTAL) 300 MG tablet; 300MG  AT 2PM AND 600MG  QHS for neuropathy of feet.   I have changed Shante D. Deviney's furosemide and Oxcarbazepine. I am also having him maintain his omeprazole, atorvastatin, metFORMIN, nitroGLYCERIN, apixaban, ferrous sulfate, cyanocobalamin, amiodarone, loratadine, NovoLOG FlexPen, sacubitril-valsartan, melatonin, rasagiline, spironolactone, vitamin E, acetaminophen, Vitamin D (Cholecalciferol), empagliflozin, Fish Oil, levothyroxine, Magnesium Oxide, Carboxymethylcellulose Sodium, HYDROcodone-acetaminophen, Dexcom G7 Receiver, and FreeStyle Libre 2 Sensor.  Meds ordered this encounter  Medications   furosemide (LASIX) 40 MG tablet    Sig: Take 1 tablet (40 mg total) by mouth daily.    Dispense:  90 tablet    Refill:  1   Oxcarbazepine (TRILEPTAL) 300 MG tablet    Sig: 300MG  AT 2PM AND 600MG  QHS for neuropathy of feet.     Dispense:  1 tablet    Refill:  1     Follow-up: Return in about 3 months (around 01/18/2023).  Mechele Claude, M.D.

## 2022-10-19 DIAGNOSIS — N1832 Chronic kidney disease, stage 3b: Secondary | ICD-10-CM | POA: Insufficient documentation

## 2022-10-19 LAB — CMP14+EGFR
ALT: 11 IU/L (ref 0–44)
AST: 18 IU/L (ref 0–40)
Albumin/Globulin Ratio: 1.6 (ref 1.2–2.2)
Albumin: 4.1 g/dL (ref 3.8–4.8)
Alkaline Phosphatase: 108 IU/L (ref 44–121)
BUN/Creatinine Ratio: 15 (ref 10–24)
BUN: 27 mg/dL (ref 8–27)
Bilirubin Total: 0.4 mg/dL (ref 0.0–1.2)
CO2: 23 mmol/L (ref 20–29)
Calcium: 9.5 mg/dL (ref 8.6–10.2)
Chloride: 97 mmol/L (ref 96–106)
Creatinine, Ser: 1.77 mg/dL — ABNORMAL HIGH (ref 0.76–1.27)
Globulin, Total: 2.6 g/dL (ref 1.5–4.5)
Glucose: 171 mg/dL — ABNORMAL HIGH (ref 70–99)
Potassium: 5 mmol/L (ref 3.5–5.2)
Sodium: 139 mmol/L (ref 134–144)
Total Protein: 6.7 g/dL (ref 6.0–8.5)
eGFR: 39 mL/min/{1.73_m2} — ABNORMAL LOW (ref >59)

## 2022-10-19 LAB — CBC WITH DIFFERENTIAL/PLATELET
Basophils Absolute: 0.1 10*3/uL (ref 0.0–0.2)
Basos: 1 %
EOS (ABSOLUTE): 0.2 10*3/uL (ref 0.0–0.4)
Eos: 2 %
Hematocrit: 43.2 % (ref 37.5–51.0)
Hemoglobin: 14.2 g/dL (ref 13.0–17.7)
Immature Grans (Abs): 0 10*3/uL (ref 0.0–0.1)
Immature Granulocytes: 0 %
Lymphocytes Absolute: 2.3 10*3/uL (ref 0.7–3.1)
Lymphs: 25 %
MCH: 29.6 pg (ref 26.6–33.0)
MCHC: 32.9 g/dL (ref 31.5–35.7)
MCV: 90 fL (ref 79–97)
Monocytes Absolute: 0.7 10*3/uL (ref 0.1–0.9)
Monocytes: 8 %
Neutrophils Absolute: 5.9 10*3/uL (ref 1.4–7.0)
Neutrophils: 64 %
Platelets: 318 10*3/uL (ref 150–450)
RBC: 4.79 x10E6/uL (ref 4.14–5.80)
RDW: 12.3 % (ref 11.6–15.4)
WBC: 9.1 10*3/uL (ref 3.4–10.8)

## 2022-10-19 LAB — LIPID PANEL
Chol/HDL Ratio: 3.8 ratio (ref 0.0–5.0)
Cholesterol, Total: 169 mg/dL (ref 100–199)
HDL: 45 mg/dL (ref >39)
LDL Chol Calc (NIH): 83 mg/dL (ref 0–99)
Triglycerides: 248 mg/dL — ABNORMAL HIGH (ref 0–149)
VLDL Cholesterol Cal: 41 mg/dL — ABNORMAL HIGH (ref 5–40)

## 2022-10-19 LAB — TSH+FREE T4
Free T4: 1.36 ng/dL (ref 0.82–1.77)
TSH: 1.48 u[IU]/mL (ref 0.450–4.500)

## 2022-11-27 DIAGNOSIS — R519 Headache, unspecified: Secondary | ICD-10-CM | POA: Insufficient documentation

## 2022-11-27 DIAGNOSIS — L570 Actinic keratosis: Secondary | ICD-10-CM | POA: Diagnosis not present

## 2022-11-27 DIAGNOSIS — Z85828 Personal history of other malignant neoplasm of skin: Secondary | ICD-10-CM | POA: Diagnosis not present

## 2023-03-20 DIAGNOSIS — L03114 Cellulitis of left upper limb: Secondary | ICD-10-CM | POA: Diagnosis not present

## 2023-03-20 DIAGNOSIS — S61432A Puncture wound without foreign body of left hand, initial encounter: Secondary | ICD-10-CM | POA: Diagnosis not present

## 2023-04-23 ENCOUNTER — Encounter: Payer: Self-pay | Admitting: Family Medicine

## 2023-04-23 ENCOUNTER — Ambulatory Visit (INDEPENDENT_AMBULATORY_CARE_PROVIDER_SITE_OTHER): Payer: Medicare Other | Admitting: Family Medicine

## 2023-04-23 VITALS — BP 96/54 | HR 69 | Temp 97.2°F | Ht 70.0 in | Wt 204.6 lb

## 2023-04-23 DIAGNOSIS — R7989 Other specified abnormal findings of blood chemistry: Secondary | ICD-10-CM

## 2023-04-23 DIAGNOSIS — I10 Essential (primary) hypertension: Secondary | ICD-10-CM

## 2023-04-23 DIAGNOSIS — E782 Mixed hyperlipidemia: Secondary | ICD-10-CM | POA: Diagnosis not present

## 2023-04-23 DIAGNOSIS — Z794 Long term (current) use of insulin: Secondary | ICD-10-CM | POA: Diagnosis not present

## 2023-04-23 DIAGNOSIS — E1159 Type 2 diabetes mellitus with other circulatory complications: Secondary | ICD-10-CM | POA: Diagnosis not present

## 2023-04-23 LAB — BAYER DCA HB A1C WAIVED: HB A1C (BAYER DCA - WAIVED): 6.7 % — ABNORMAL HIGH (ref 4.8–5.6)

## 2023-04-23 MED ORDER — FUROSEMIDE 40 MG PO TABS: 40.0000 mg | ORAL_TABLET | Freq: Every day | ORAL | 1 refills | Status: AC

## 2023-04-23 MED ORDER — FUROSEMIDE 40 MG PO TABS: 40.0000 mg | ORAL_TABLET | Freq: Every day | ORAL | 1 refills | Status: DC

## 2023-04-23 NOTE — Progress Notes (Signed)
Fax received from [facility] pharmacy - pt does not have authorization for community care. Please send prescription to an outside non[facility] pharmacy. Furosemide from today's visit sent to [facility] per TC to pt.

## 2023-04-23 NOTE — Addendum Note (Signed)
Addended by: Julious Payer D on: 04/23/2023 04:26 PM   Modules accepted: Orders

## 2023-04-23 NOTE — Progress Notes (Signed)
Subjective:  Patient ID: Edward Heath,  male    DOB: 06-03-1945  Age: 77 y.o.    CC: Medical Management of Chronic Issues   HPI Edward Heath presents for  follow-up of hypertension. Patient has no history of headache chest pain or shortness of breath or recent cough. Patient also denies symptoms of TIA such as numbness weakness lateralizing. Patient denies side effects from medication. States taking it regularly. BP intentionally kept low by cardiology due to CHF and ejection Fx of 27 states his wife.  Patient also  in for follow-up of elevated cholesterol. Doing well without complaints on current medication. Denies side effects  including myalgia and arthralgia and nausea. Also in today for liver function testing. Currently no chest pain, shortness of breath or other cardiovascular related symptoms noted.  Follow-up of diabetes. Patient does check blood sugar at home. Readings run between 140 and 200 Patient denies symptoms such as excessive hunger or urinary frequency, excessive hunger, nausea No significant hypoglycemic spells noted. Medications reviewed. Pt reports taking them regularly. Pt. denies complication/adverse reaction today.    History Kate has a past medical history of AICD (automatic cardioverter/defibrillator) present, Arthritis, CHF (congestive heart failure) (HCC), Coronary artery disease, Diabetes mellitus without complication (HCC), Dysrhythmia, GERD (gastroesophageal reflux disease), Heart attack (HCC) (2004, 2015), History of kidney stones, Hyperlipidemia, Hypertension, Hypothyroidism, Lumbar vertebral fracture (HCC) (2012), NSTEMI, initial episode of care [facility]) (10/09/2013), Skin cancer (06/2014), Stroke [facility]), Thyroid disease, and TIA (transient ischemic attack) (2009).   Edward Heath has a past surgical history that includes Cardiac defibrillator placement (12/2003, Repaired in 2012); Skin cancer excision (06/2014); Knee surgery (Right, 1994); and Ulnar nerve  transposition (Left, 09/05/2019).   His family history includes Aneurysm in his father; Heart attack in his mother; Heart attack (age of onset: 29) in his brother.Edward Heath reports that Edward Heath has never smoked. His smokeless tobacco use includes chew. Edward Heath reports current alcohol use. Edward Heath reports that Edward Heath does not use drugs.  Current Outpatient Medications on File Prior to Visit  Medication Sig Dispense Refill   acetaminophen (TYLENOL) 500 MG tablet Take 500 mg by mouth 3 (three) times daily as needed.     amiodarone (PACERONE) 200 MG tablet Take 200 mg by mouth daily.     apixaban (ELIQUIS) 5 MG TABS tablet Take 1 tablet (5 mg total) by mouth 2 (two) times daily. 2 BID X 7 days then 1 BID (Patient taking differently: Take 5 mg by mouth 2 (two) times daily.) 75 tablet 0   atorvastatin (LIPITOR) 80 MG tablet Take 40 mg by mouth daily.     Carboxymethylcellulose Sodium 0.25 % SOLN Apply 1 drop to eye in the morning, at noon, in the evening, and at bedtime.     Continuous Blood Gluc Receiver (DEXCOM G7 RECEIVER) DEVI Use to record glucose readings QID and as needed 1 each PRN   Continuous Blood Gluc Sensor (FREESTYLE LIBRE 2 SENSOR) MISC USE AS DIRECTED, CHANGING EVERY 14 DAYS 6 each 3   cyanocobalamin 1000 MCG tablet Take 1,000 mcg by mouth daily.     empagliflozin (JARDIANCE) 25 MG TABS tablet Take 12.5 mg by mouth daily.     ferrous sulfate 325 (65 FE) MG tablet Take 325 mg by mouth daily with breakfast.      HYDROcodone-acetaminophen (NORCO) 7.5-325 MG tablet Take 1 tablet by mouth every 12 (twelve) hours as needed. 30 tablet 0   insulin aspart (NOVOLOG FLEXPEN) 100 UNIT/ML FlexPen Inject 20 Units into the skin  in the morning and at bedtime.     levothyroxine (SYNTHROID) 137 MCG tablet Take 137 mcg by mouth daily before breakfast.     loratadine (CLARITIN) 10 MG tablet Take 10 mg by mouth daily.     Magnesium Oxide 420 MG TABS Take 420 mg by mouth in the morning and at bedtime.     melatonin 3 MG TABS tablet  Take 12 mg by mouth at bedtime.     metFORMIN (GLUCOPHAGE) 1000 MG tablet Take 1,000 mg by mouth 2 (two) times daily with a meal.      Omega-3 Fatty Acids (FISH OIL) 1000 MG CAPS Take 1,000 mg by mouth daily.     omeprazole (PRILOSEC) 20 MG capsule Take 20 mg by mouth 2 (two) times daily.     Oxcarbazepine (TRILEPTAL) 300 MG tablet 300MG  AT 2PM AND 600MG  QHS for neuropathy of feet. 1 tablet 1   rasagiline (AZILECT) 0.5 MG TABS tablet Take 1 mg by mouth daily.     sacubitril-valsartan (ENTRESTO) 24-26 MG Take 0.5 tablets by mouth 2 (two) times daily.     spironolactone (ALDACTONE) 25 MG tablet Take 25 mg by mouth daily.     Vitamin D, Cholecalciferol, 10 MCG (400 UNIT) TABS Take 400 Units by mouth daily.     vitamin E 180 MG (400 UNITS) capsule Take 400 Units by mouth daily.     nitroGLYCERIN (NITROSTAT) 0.4 MG SL tablet Place 0.4 mg under the tongue every 5 (five) minutes as needed for chest pain.      No current facility-administered medications on file prior to visit.    ROS Review of Systems  Constitutional:  Negative for fever.  Respiratory:  Negative for shortness of breath.   Cardiovascular:  Negative for chest pain.  Musculoskeletal:  Negative for arthralgias.  Skin:  Negative for rash.    Objective:  BP (!) 96/54   Pulse 69   Temp (!) 97.2 F (36.2 C)   Ht 5\' 10"  (1.778 m)   Wt 204 lb 9.6 oz (92.8 kg)   SpO2 96%   BMI 29.36 kg/m   BP Readings from Last 3 Encounters:  04/23/23 (!) 96/54  10/18/22 (!) 94/48  07/20/22 (!) 100/53    Wt Readings from Last 3 Encounters:  04/23/23 204 lb 9.6 oz (92.8 kg)  10/18/22 202 lb 9.6 oz (91.9 kg)  10/13/22 200 lb (90.7 kg)     Physical Exam Vitals reviewed.  Constitutional:      Appearance: Edward Heath is well-developed.  HENT:     Head: Normocephalic and atraumatic.     Right Ear: External ear normal.     Left Ear: External ear normal.     Mouth/Throat:     Pharynx: No oropharyngeal exudate or posterior oropharyngeal  erythema.  Eyes:     Pupils: Pupils are equal, round, and reactive to light.  Cardiovascular:     Rate and Rhythm: Normal rate and regular rhythm.     Heart sounds: No murmur heard. Pulmonary:     Effort: No respiratory distress.     Breath sounds: Normal breath sounds.  Musculoskeletal:     Cervical back: Normal range of motion and neck supple.  Neurological:     Mental Status: Edward Heath is alert and oriented to person, place, and time.     Diabetic Foot Exam - Simple   No data filed     Lab Results  Component Value Date   HGBA1C 7.1 (H) 10/18/2022   HGBA1C 7.6 (  H) 07/20/2022   HGBA1C 6.6 (H) 01/17/2022    Assessment & Plan:   Edward Heath was seen today for medical management of chronic issues.  Diagnoses and all orders for this visit:  Type 2 diabetes mellitus with other circulatory complication, with long-term current use of insulin (HCC) -     Bayer DCA Hb A1c Waived  Mixed hyperlipidemia -     Lipid panel  Hypertension, unspecified type -     CBC with Differential/Platelet -     CMP14+EGFR  Elevated serum free T4 level -     TSH + free T4  Other orders -     furosemide (LASIX) 40 MG tablet; Take 1 tablet (40 mg total) by mouth daily.   I am having Ojas D. Taulbee maintain his omeprazole, atorvastatin, metFORMIN, nitroGLYCERIN, apixaban, ferrous sulfate, cyanocobalamin, amiodarone, loratadine, NovoLOG FlexPen, sacubitril-valsartan, melatonin, rasagiline, spironolactone, vitamin E, acetaminophen, Vitamin D (Cholecalciferol), empagliflozin, Fish Oil, levothyroxine, Magnesium Oxide, Carboxymethylcellulose Sodium, HYDROcodone-acetaminophen, Dexcom G7 Receiver, FreeStyle Libre 2 Sensor, Oxcarbazepine, and furosemide.  Meds ordered this encounter  Medications   furosemide (LASIX) 40 MG tablet    Sig: Take 1 tablet (40 mg total) by mouth daily.    Dispense:  90 tablet    Refill:  1     Follow-up: Return in about 6 months (around 10/22/2023).  Mechele Claude, M.D.

## 2023-04-23 NOTE — Patient Instructions (Signed)
A1c = 6.7 today

## 2023-04-24 LAB — CBC WITH DIFFERENTIAL/PLATELET
Basophils Absolute: 0.1 10*3/uL (ref 0.0–0.2)
Basos: 1 %
EOS (ABSOLUTE): 0.2 10*3/uL (ref 0.0–0.4)
Eos: 2 %
Hematocrit: 43.4 % (ref 37.5–51.0)
Hemoglobin: 14 g/dL (ref 13.0–17.7)
Immature Grans (Abs): 0 10*3/uL (ref 0.0–0.1)
Immature Granulocytes: 0 %
Lymphocytes Absolute: 1.4 10*3/uL (ref 0.7–3.1)
Lymphs: 14 %
MCH: 30.2 pg (ref 26.6–33.0)
MCHC: 32.3 g/dL (ref 31.5–35.7)
MCV: 94 fL (ref 79–97)
Monocytes Absolute: 1 10*3/uL — ABNORMAL HIGH (ref 0.1–0.9)
Monocytes: 9 %
Neutrophils Absolute: 7.5 10*3/uL — ABNORMAL HIGH (ref 1.4–7.0)
Neutrophils: 74 %
Platelets: 277 10*3/uL (ref 150–450)
RBC: 4.63 x10E6/uL (ref 4.14–5.80)
RDW: 12.5 % (ref 11.6–15.4)
WBC: 10.1 10*3/uL (ref 3.4–10.8)

## 2023-04-24 LAB — CMP14+EGFR
ALT: 16 IU/L (ref 0–44)
AST: 21 IU/L (ref 0–40)
Albumin: 3.9 g/dL (ref 3.8–4.8)
Alkaline Phosphatase: 97 IU/L (ref 44–121)
BUN/Creatinine Ratio: 12 (ref 10–24)
BUN: 20 mg/dL (ref 8–27)
CO2: 26 mmol/L (ref 20–29)
Calcium: 9.2 mg/dL (ref 8.6–10.2)
Chloride: 98 mmol/L (ref 96–106)
Creatinine, Ser: 1.71 mg/dL — ABNORMAL HIGH (ref 0.76–1.27)
Globulin, Total: 2.6 g/dL (ref 1.5–4.5)
Glucose: 157 mg/dL — ABNORMAL HIGH (ref 70–99)
Potassium: 5.1 mmol/L (ref 3.5–5.2)
Sodium: 138 mmol/L (ref 134–144)
Total Protein: 6.5 g/dL (ref 6.0–8.5)
eGFR: 40 mL/min/{1.73_m2} — ABNORMAL LOW (ref >59)

## 2023-04-24 LAB — LIPID PANEL
Cholesterol, Total: 128 mg/dL (ref 100–199)
HDL: 44 mg/dL (ref >39)
LDL CALC COMMENT:: 2.9 ratio (ref 0.0–5.0)
LDL Chol Calc (NIH): 56 mg/dL (ref 0–99)
Triglycerides: 164 mg/dL — ABNORMAL HIGH (ref 0–149)
VLDL Cholesterol Cal: 28 mg/dL (ref 5–40)

## 2023-04-24 LAB — TSH+FREE T4
Free T4: 1.2 ng/dL (ref 0.82–1.77)
TSH: 2.8 u[IU]/mL (ref 0.450–4.500)

## 2023-04-24 NOTE — Progress Notes (Signed)
[facility],  Your lab result is normal and/or stable.Some minor variations that are not significant are commonly marked abnormal, but do not represent any medical problem for you.  Best regards, Claretta Fraise, M.D.

## 2023-06-20 DIAGNOSIS — L570 Actinic keratosis: Secondary | ICD-10-CM | POA: Diagnosis not present

## 2023-06-20 DIAGNOSIS — C44329 Squamous cell carcinoma of skin of other parts of face: Secondary | ICD-10-CM | POA: Diagnosis not present

## 2023-06-20 DIAGNOSIS — D0462 Carcinoma in situ of skin of left upper limb, including shoulder: Secondary | ICD-10-CM | POA: Diagnosis not present

## 2023-06-20 DIAGNOSIS — L578 Other skin changes due to chronic exposure to nonionizing radiation: Secondary | ICD-10-CM | POA: Diagnosis not present

## 2023-06-20 DIAGNOSIS — L853 Xerosis cutis: Secondary | ICD-10-CM | POA: Diagnosis not present

## 2023-06-20 DIAGNOSIS — D485 Neoplasm of uncertain behavior of skin: Secondary | ICD-10-CM | POA: Diagnosis not present

## 2023-06-20 DIAGNOSIS — Z85828 Personal history of other malignant neoplasm of skin: Secondary | ICD-10-CM | POA: Diagnosis not present

## 2023-07-17 DIAGNOSIS — D0462 Carcinoma in situ of skin of left upper limb, including shoulder: Secondary | ICD-10-CM | POA: Diagnosis not present

## 2023-07-17 DIAGNOSIS — C44329 Squamous cell carcinoma of skin of other parts of face: Secondary | ICD-10-CM | POA: Diagnosis not present

## 2023-08-22 DIAGNOSIS — S30861A Insect bite (nonvenomous) of abdominal wall, initial encounter: Secondary | ICD-10-CM | POA: Diagnosis not present

## 2023-09-10 DIAGNOSIS — M9902 Segmental and somatic dysfunction of thoracic region: Secondary | ICD-10-CM | POA: Diagnosis not present

## 2023-09-10 DIAGNOSIS — M546 Pain in thoracic spine: Secondary | ICD-10-CM | POA: Diagnosis not present

## 2023-09-10 DIAGNOSIS — M5127 Other intervertebral disc displacement, lumbosacral region: Secondary | ICD-10-CM | POA: Diagnosis not present

## 2023-09-10 DIAGNOSIS — M9903 Segmental and somatic dysfunction of lumbar region: Secondary | ICD-10-CM | POA: Diagnosis not present

## 2023-10-17 ENCOUNTER — Ambulatory Visit: Payer: Medicare Other

## 2023-10-17 VITALS — BP 96/54 | HR 69 | Ht 70.0 in | Wt 204.0 lb

## 2023-10-17 DIAGNOSIS — Z0000 Encounter for general adult medical examination without abnormal findings: Secondary | ICD-10-CM | POA: Diagnosis not present

## 2023-10-17 NOTE — Patient Instructions (Signed)
 [name] , Thank you for taking time out of your busy schedule to complete your Annual Wellness Visit with me. I enjoyed our conversation and look forward to speaking with you again next year. I, as well as your care team,  appreciate your ongoing commitment to your health goals. Please review the following plan we discussed and let me know if I can assist you in the future. Your Game plan/ To Do List   Follow up Visits: Next Medicare AWV with our clinical staff: 10/17/24 at 10:40a.m.   Have you seen your provider in the last 6 months (3 months if uncontrolled diabetes)? Yes 12/24 Next Office Visit with your provider: 10/22/23 at 9:10a.m.  Clinician Recommendations:  Aim for 30 minutes of exercise or brisk walking, 6-8 glasses of water, and 5 servings of fruits and vegetables each day. N/a      This is a list of the screening recommended for you and due dates:  Health Maintenance  Topic Date Due   Eye exam for diabetics  Never done   Zoster (Shingles) Vaccine (1 of 2) 12/10/1963   Complete foot exam   12/20/2017   DTaP/Tdap/Td vaccine (1 - Tdap) 09/14/2021   Yearly kidney health urinalysis for diabetes  01/18/2023   Hepatitis C Screening  04/22/2024*   COVID-19 Vaccine (3 - Moderna risk series) 11/01/2024*   Hemoglobin A1C  10/22/2023   Flu Shot  12/21/2023   Yearly kidney function blood test for diabetes  04/22/2024   Medicare Annual Wellness Visit  10/16/2024   DEXA scan (bone density measurement)  02/02/2026   Pneumonia Vaccine  Completed   HPV Vaccine  Aged Out   Meningitis B Vaccine  Aged Out  *Topic was postponed. The date shown is not the original due date.    Advanced directives: (Copy Requested) Please bring a copy of your health care power of attorney and living will to the office to be added to your chart at your convenience. You can mail to [facility] [address] [place], [place] [address] or email to [email or web address] Advance Care Planning  is important because it:  [x]  Makes sure you receive the medical care that is consistent with your values, goals, and preferences  [x]  It provides guidance to your family and loved ones and reduces their decisional burden about whether or not they are making the right decisions based on your wishes.  Follow the link provided in your after visit summary or read over the paperwork we have mailed to you to help you started getting your Advance Directives in place. If you need assistance in completing these, please reach out to us  so that we can help you!  See attachments for Preventive Care and Fall Prevention Tips.

## 2023-10-17 NOTE — Progress Notes (Signed)
 Subjective:   Edward Heath is a 78 y.o. who presents for a Medicare Wellness preventive visit.  As a reminder, Annual Wellness Visits don't include a physical exam, and some assessments may be limited, especially if this visit is performed virtually. We may recommend an in-person follow-up visit with your provider if needed.  Visit Complete: Virtual I connected with  Edward Heath on 10/17/23 by a audio enabled telemedicine application and verified that I am speaking with the correct person using two identifiers.  Patient Location: Home  Provider Location: Home Office  I discussed the limitations of evaluation and management by telemedicine. The patient expressed understanding and agreed to proceed.  Vital Signs: Because this visit was a virtual/telehealth visit, some criteria may be missing or patient reported. Any vitals not documented were not able to be obtained and vitals that have been documented are patient reported.  VideoDeclined- This patient declined Librarian, academic. Therefore the visit was completed with audio only.  Persons Participating in Visit: Patient.  AWV Questionnaire: No: Patient Medicare AWV questionnaire was not completed prior to this visit.  Cardiac Risk Factors include: advanced age (>31men, >42 women);dyslipidemia;hypertension;male gender     Objective:     Today's Vitals   10/17/23 0916  BP: (!) 96/54  Pulse: 69  Weight: 204 lb (92.5 kg)  Height: 5\' 10"  (1.778 m)   Body mass index is 29.27 kg/m.     10/17/2023    9:27 AM 10/13/2022    2:08 PM 10/11/2021    2:59 PM 05/18/2021    2:58 PM 03/02/2021   10:42 AM 10/08/2020   10:53 AM 09/05/2019    9:41 AM  Advanced Directives  Does Patient Have a Medical Advance Directive? Yes Yes Yes No Yes Yes No  Type of Estate agent of [employer] Power of [place];Living will Healthcare Power of [place];Living will  Healthcare Power of  [place];Living will Healthcare Power of Attorney   Does patient want to make changes to medical advance directive?     No - Patient declined    Copy of Healthcare Power of Attorney in Chart?  No - copy requested No - copy requested  No - copy requested No - copy requested   Would patient like information on creating a medical advance directive?       No - Patient declined    Current Medications (verified) Outpatient Encounter Medications as of 10/17/2023  Medication Sig   acetaminophen  (TYLENOL ) 500 MG tablet Take 500 mg by mouth 3 (three) times daily as needed.   amiodarone  (PACERONE ) 200 MG tablet Take 200 mg by mouth daily.   apixaban  (ELIQUIS ) 5 MG TABS tablet Take 1 tablet (5 mg total) by mouth 2 (two) times daily. 2 BID X 7 days then 1 BID (Patient taking differently: Take 5 mg by mouth 2 (two) times daily.)   atorvastatin  (LIPITOR) 80 MG tablet Take 40 mg by mouth daily.   Carboxymethylcellulose Sodium 0.25 % SOLN Apply 1 drop to eye in the morning, at noon, in the evening, and at bedtime.   Continuous Blood Gluc Receiver (DEXCOM G7 RECEIVER) DEVI Use to record glucose readings QID and as needed   Continuous Blood Gluc Sensor (FREESTYLE LIBRE 2 SENSOR) MISC USE AS DIRECTED, CHANGING EVERY 14 DAYS   cyanocobalamin 1000 MCG tablet Take 1,000 mcg by mouth daily.   empagliflozin (JARDIANCE) 25 MG TABS tablet Take 12.5 mg by mouth daily.   ferrous sulfate  325 (65 FE)  MG tablet Take 325 mg by mouth daily with breakfast.    furosemide  (LASIX ) 40 MG tablet Take 1 tablet (40 mg total) by mouth daily.   HYDROcodone -acetaminophen  (NORCO) 7.5-325 MG tablet Take 1 tablet by mouth every 12 (twelve) hours as needed.   insulin  aspart (NOVOLOG  FLEXPEN) 100 UNIT/ML FlexPen Inject 20 Units into the skin in the morning and at bedtime.   levothyroxine (SYNTHROID) 137 MCG tablet Take 137 mcg by mouth daily before breakfast.   loratadine (CLARITIN) 10 MG tablet Take 10 mg by mouth daily.   Magnesium  Oxide  420 MG TABS Take 420 mg by mouth in the morning and at bedtime.   melatonin 3 MG TABS tablet Take 12 mg by mouth at bedtime.   metFORMIN (GLUCOPHAGE) 1000 MG tablet Take 1,000 mg by mouth 2 (two) times daily with a meal.    Omega-3 Fatty Acids (FISH OIL) 1000 MG CAPS Take 1,000 mg by mouth daily.   omeprazole (PRILOSEC) 20 MG capsule Take 20 mg by mouth 2 (two) times daily.   Oxcarbazepine  (TRILEPTAL ) 300 MG tablet 300MG  AT 2PM AND 600MG  QHS for neuropathy of feet.   rasagiline (AZILECT) 0.5 MG TABS tablet Take 1 mg by mouth daily.   sacubitril-valsartan (ENTRESTO) 24-26 MG Take 0.5 tablets by mouth 2 (two) times daily.   spironolactone (ALDACTONE) 25 MG tablet Take 25 mg by mouth daily.   Vitamin D, Cholecalciferol , 10 MCG (400 UNIT) TABS Take 400 Units by mouth daily.   vitamin E 180 MG (400 UNITS) capsule Take 400 Units by mouth daily.   nitroGLYCERIN  (NITROSTAT ) 0.4 MG SL tablet Place 0.4 mg under the tongue every 5 (five) minutes as needed for chest pain.    No facility-administered encounter medications on file as of 10/17/2023.    Allergies (verified) Patient has no known allergies.   History: Past Medical History:  Diagnosis Date   AICD (automatic cardioverter/defibrillator) present    Arthritis    CHF (congestive heart failure) (HCC)    Coronary artery disease    Diabetes mellitus without complication (HCC)    Dysrhythmia    GERD (gastroesophageal reflux disease)    Heart attack (HCC) 2004, 2015   History of kidney stones    Hyperlipidemia    Hypertension    Hypothyroidism    Lumbar vertebral fracture (HCC) 2012   NSTEMI, initial episode of care (HCC) 10/09/2013   Skin cancer 06/2014   left cheek   Stroke (HCC)    x3   Thyroid  disease    TIA (transient ischemic attack) 2009   Past Surgical History:  Procedure Laterality Date   CARDIAC DEFIBRILLATOR PLACEMENT  12/2003, Repaired in 2012   x3   KNEE SURGERY Right 1994   SKIN CANCER EXCISION  06/2014   had 4 removed    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   Procedure: LEFT ELBOW ULNAR NERVE DECOMPRESSION;  Surgeon: Adah Acron, MD;  Location: [facility] OR;  Service: Orthopedics;  Laterality: Left;   Family History  Problem Relation Age of Onset   Heart attack Mother    Aneurysm Father    Heart attack Brother 51   Social History   Socioeconomic History   Marital status: Married    Spouse name: Stana Ear   Number of children: 2   Years of education: Not on file   Highest education level: Not on file  Occupational History   Occupation: retired    Comment: [employer]  Tobacco Use   Smoking status: Never  Smokeless tobacco: Current    Types: Chew  Vaping Use   Vaping status: Never Used  Substance and Sexual Activity   Alcohol use: Yes    Comment: occ   Drug use: No   Sexual activity: Yes  Other Topics Concern   Not on file  Social History Narrative   Lives home with wife - daughter and son stay sometimes - one granddaughter lives about an hour away.   Enjoys golfing      [facility] Benefits   Social Drivers of Health   Financial Resource Strain: Low Risk  (10/17/2023)   Overall Financial Resource Strain (CARDIA)    Difficulty of Paying Living Expenses: Not hard at all  Food Insecurity: No Food Insecurity (10/17/2023)   Hunger Vital Sign    Worried About Running Out of Food in the Last Year: Never true    Ran Out of Food in the Last Year: Never true  Transportation Needs: No Transportation Needs (10/17/2023)   PRAPARE - Administrator, Civil Service (Medical): No    Lack of Transportation (Non-Medical): No  Physical Activity: Insufficiently Active (10/17/2023)   Exercise Vital Sign    Days of Exercise per Week: 7 days    Minutes of Exercise per Session: 20 min  Stress: No Stress Concern Present (10/17/2023)   [employer] of Occupational Health - Occupational Stress Questionnaire    Feeling of Stress : Not at all  Social Connections: Socially Integrated (10/17/2023)   Social  Connection and Isolation Panel [NHANES]    Frequency of Communication with Friends and Family: More than three times a week    Frequency of Social Gatherings with Friends and Family: More than three times a week    Attends Religious Services: More than 4 times per year    Active Member of [employer] or Organizations: Yes    Attends Banker Meetings: Never    Marital Status: Married    Tobacco Counseling Ready to quit: Not Answered Counseling given: Yes    Clinical Intake:  Pre-visit preparation completed: Yes  Pain : No/denies pain     BMI - recorded: 29.67 Nutritional Status: BMI 25 -29 Overweight Nutritional Risks: None Diabetes: Yes CBG done?: Yes (123 this morning)  Lab Results  Component Value Date   HGBA1C 6.7 (H) 04/23/2023   HGBA1C 7.1 (H) 10/18/2022   HGBA1C 7.6 (H) 07/20/2022     How often do you need to have someone help you when you read instructions, pamphlets, or other written materials from your doctor or pharmacy?: 1 - Never  Interpreter Needed?: No  Information entered by :: Alia T/cma   Activities of Daily Living     10/17/2023    9:23 AM  In your present state of health, do you have any difficulty performing the following activities:  Hearing? 1  Vision? 0  Difficulty concentrating or making decisions? 0  Walking or climbing stairs? 0  Dressing or bathing? 0  Doing errands, shopping? 0  Preparing Food and eating ? N  Using the Toilet? N  In the past six months, have you accidently leaked urine? N  Do you have problems with loss of bowel control? N  Managing your Medications? Y  Comment pt's wife  Managing your Finances? Y  Comment pt's wife  Housekeeping or managing your Housekeeping? Y  Comment pt's wife    Patient Care Team: Roise Cleaver, MD as PCP - General (Family Medicine) Bryon Caraway, MD as Consulting Physician (Dermatology)  Aboumohamed, Maryln Sober, MD as Consulting Physician (Urology) Margret Shepherd,  MD as Referring Physician (Diagnostic Radiology)  Indicate any recent Medical Services you may have received from other than [facility] providers in the past year (date may be approximate).     Assessment:    This is a routine wellness examination for Edward Heath.  Hearing/Vision screen Hearing Screening - Comments:: Pt stated yes/have aids Vision Screening - Comments:: Pt wear glasses/[facility]   Goals Addressed             This Visit's Progress    Exercise 3x per week (30 min per time)   Not on track    Exercise at the [facility] or walk for 1 hour 3 times per week.     Patient Stated       Would like to to get the pinched nerve in his back check       Depression Screen     10/17/2023    9:31 AM 04/23/2023   10:09 AM 10/18/2022    3:08 PM 10/13/2022    2:07 PM 07/20/2022    9:16 AM 01/17/2022    8:48 AM 10/11/2021    2:57 PM  PHQ 2/9 Scores  PHQ - 2 Score 0 0 0 0 0 0 0  PHQ- 9 Score 0          Fall Risk     10/17/2023    9:43 AM 04/23/2023   10:09 AM 10/18/2022    3:08 PM 10/13/2022    2:07 PM 07/20/2022    9:16 AM  Fall Risk   Falls in the past year? 0 0 0 0 0  Number falls in past yr: 0   0   Injury with Fall? 0   0   Risk for fall due to : No Fall Risks   No Fall Risks   Follow up Falls evaluation completed   Falls prevention discussed     MEDICARE RISK AT HOME:  Medicare Risk at Home Any stairs in or around the home?: No If so, are there any without handrails?: No Home free of loose throw rugs in walkways, pet beds, electrical cords, etc?: Yes Adequate lighting in your home to reduce risk of falls?: Yes Life alert?: No Use of a cane, walker or w/c?: Yes Grab bars in the bathroom?: Yes Shower chair or bench in shower?: Yes Elevated toilet seat or a handicapped toilet?: Yes  TIMED UP AND GO:  Was the test performed?  no  Cognitive Function: 6CIT completed    03/25/2018    4:24 PM 12/20/2016   10:19 AM 12/14/2015    9:57 AM 08/05/2014    4:32 PM  MMSE -  Mini Mental State Exam  Orientation to time 5 5 5 5   Orientation to Place 5 5 5 5   Registration 3 3 3 3   Attention/ Calculation 4 4 4 4   Recall 3 3 2 2   Language- name 2 objects 2 2 2 2   Language- repeat 1 1 1 1   Language- follow 3 step command 3 3 3 3   Language- read & follow direction 1 1 1 1   Write a sentence 1 1 1 1   Copy design 1 1 0 1  Total score 29 29 27 28         10/17/2023    9:33 AM 10/13/2022    2:09 PM 10/11/2021    3:01 PM  6CIT Screen  What Year? 0 points 0 points 0 points  What month? 0 points 0 points 0 points  What time? 0 points 0 points 0 points  Count back from 20 0 points 0 points 0 points  Months in reverse 0 points 0 points 0 points  Repeat phrase 0 points 0 points 4 points  Total Score 0 points 0 points 4 points    Immunizations Immunization History  Administered Date(s) Administered   Fluad Quad(high Dose 65+) 03/08/2021   Influenza Split 04/18/2017   Influenza, High Dose Seasonal PF 03/06/2013, 01/26/2015, 03/10/2022, 02/07/2023   Influenza,inj,quad, With Preservative 02/21/2019   Influenza-Unspecified 03/22/2014, 02/17/2016, 02/21/2019   Moderna SARS-COV2 Booster Vaccination 07/14/2020   Moderna Sars-Covid-2 Vaccination 07/02/2019, 07/30/2019   Pneumococcal Conjugate-13 03/22/2014   Pneumococcal Polysaccharide-23 03/22/2013, 06/05/2013   Rsv, Bivalent, Protein Subunit Rsvpref,pf Pattricia Bores) 08/02/2022   Td (Adult),5 Lf Tetanus Toxid, Preservative Free 09/13/2021   Zoster, Live 11/19/2013    Screening Tests Health Maintenance  Topic Date Due   OPHTHALMOLOGY EXAM  Never done   Zoster Vaccines- Shingrix (1 of 2) 12/10/1963   FOOT EXAM  12/20/2017   DTaP/Tdap/Td (1 - Tdap) 09/14/2021   Diabetic kidney evaluation - Urine ACR  01/18/2023   Hepatitis C Screening  04/22/2024 (Originally 12/10/1963)   COVID-19 Vaccine (3 - Moderna risk series) 11/01/2024 (Originally 08/11/2020)   HEMOGLOBIN A1C  10/22/2023   INFLUENZA VACCINE  12/21/2023    Diabetic kidney evaluation - eGFR measurement  04/22/2024   Medicare Annual Wellness (AWV)  10/16/2024   DEXA SCAN  02/02/2026   Pneumonia Vaccine 13+ Years old  Completed   HPV VACCINES  Aged Out   Meningococcal B Vaccine  Aged Out    Health Maintenance  Health Maintenance Due  Topic Date Due   OPHTHALMOLOGY EXAM  Never done   Zoster Vaccines- Shingrix (1 of 2) 12/10/1963   FOOT EXAM  12/20/2017   DTaP/Tdap/Td (1 - Tdap) 09/14/2021   Diabetic kidney evaluation - Urine ACR  01/18/2023   Health Maintenance Items Addressed: See Nurse Notes  Additional Screening:  Vision Screening: Recommended annual ophthalmology exams for early detection of glaucoma and other disorders of the eye.  Dental Screening: Recommended annual dental exams for proper oral hygiene  Community Resource Referral / Chronic Care Management: CRR required this visit?  No   CCM required this visit?  No   Plan:    I have personally reviewed and noted the following in the patient's chart:   Medical and social history Use of alcohol, tobacco or illicit drugs  Current medications and supplements including opioid prescriptions. Patient is not currently taking opioid prescriptions. Functional ability and status Nutritional status Physical activity Advanced directives List of other physicians Hospitalizations, surgeries, and ER visits in previous 12 months Vitals Screenings to include cognitive, depression, and falls Referrals and appointments  In addition, I have reviewed and discussed with patient certain preventive protocols, quality metrics, and best practice recommendations. A written personalized care plan for preventive services as well as general preventive health recommendations were provided to patient.   Michaelle Adolphus, CMA   10/17/2023   After Visit Summary: (MyChart) Due to this being a telephonic visit, the after visit summary with patients personalized plan was offered to patient via MyChart    Notes: Pt is aware of the following and is encouraged to done to update his chart: Shingles vaccine is done per pt, will get record from [place], [facility]. Will get Urine ACR/Foot exam at next OV w/pcp

## 2023-10-22 ENCOUNTER — Encounter: Payer: Self-pay | Admitting: Family Medicine

## 2023-10-22 ENCOUNTER — Ambulatory Visit: Payer: Medicare Other | Admitting: Family Medicine

## 2023-10-22 ENCOUNTER — Ambulatory Visit: Payer: Self-pay | Admitting: Family Medicine

## 2023-10-22 VITALS — BP 101/58 | HR 60 | Temp 98.0°F | Ht 70.0 in | Wt 203.0 lb

## 2023-10-22 DIAGNOSIS — Z794 Long term (current) use of insulin: Secondary | ICD-10-CM

## 2023-10-22 DIAGNOSIS — R2681 Unsteadiness on feet: Secondary | ICD-10-CM | POA: Diagnosis not present

## 2023-10-22 DIAGNOSIS — N1832 Chronic kidney disease, stage 3b: Secondary | ICD-10-CM

## 2023-10-22 DIAGNOSIS — E1159 Type 2 diabetes mellitus with other circulatory complications: Secondary | ICD-10-CM | POA: Diagnosis not present

## 2023-10-22 DIAGNOSIS — R7989 Other specified abnormal findings of blood chemistry: Secondary | ICD-10-CM

## 2023-10-22 DIAGNOSIS — I10 Essential (primary) hypertension: Secondary | ICD-10-CM | POA: Diagnosis not present

## 2023-10-22 DIAGNOSIS — E782 Mixed hyperlipidemia: Secondary | ICD-10-CM | POA: Diagnosis not present

## 2023-10-22 DIAGNOSIS — D508 Other iron deficiency anemias: Secondary | ICD-10-CM | POA: Diagnosis not present

## 2023-10-22 LAB — BAYER DCA HB A1C WAIVED: HB A1C (BAYER DCA - WAIVED): 6.1 % — ABNORMAL HIGH (ref 4.8–5.6)

## 2023-10-22 NOTE — Progress Notes (Signed)
 Subjective:  Patient ID: LADARION MUNYON,  male    DOB: 05-Jul-1945  Age: 78 y.o.    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 Going to chiropractor and neuro specialist next month. Ongoing issue since having surgery. Right side mostly effected. ) and Diabetes (*Did not want to stand in lab no seats)   HPI Jaelan D Cid presents for  follow-up of hypertension. Patient has no history of headache chest pain or shortness of breath or recent cough. Patient also denies symptoms of TIA such as numbness weakness lateralizing. Patient denies side effects from medication. States taking it regularly.  Patient also  in for follow-up of elevated cholesterol. Doing well without complaints on current medication. Denies side effects  including myalgia and arthralgia and nausea. Also in today for liver function testing. Currently no chest pain, shortness of breath or other cardiovascular related symptoms noted.  Follow-up of diabetes. Patient does not check blood sugar at home.  Patient denies symptoms such as excessive hunger or urinary frequency, excessive hunger, nausea No significant hypoglycemic spells noted. Medications reviewed. Pt reports taking them regularly. Pt. denies complication/adverse reaction today.   [facility]  History Gerlad has a past medical history of AICD (automatic cardioverter/defibrillator) present, Arthritis, CHF (congestive heart failure) (HCC), Coronary artery disease, Diabetes mellitus without complication (HCC), Dysrhythmia, GERD (gastroesophageal reflux disease), Heart attack (HCC) (2004, 2015), History of kidney stones, Hyperlipidemia, Hypertension, Hypothyroidism, Lumbar vertebral fracture (HCC) (2012), NSTEMI, initial episode of care [facility]) (10/09/2013), Skin cancer (06/2014), Stroke [facility]), Thyroid  disease, and TIA (transient ischemic attack) (2009).   He has a past surgical history that includes Cardiac  defibrillator placement (12/2003, Repaired in 2012); Skin cancer excision (06/2014); Knee surgery (Right, 1994); and Ulnar nerve transposition (Left, 09/05/2019).   His family history includes Aneurysm in his father; Heart attack in his mother; Heart attack (age of onset: 29) in his brother.He reports that he has never smoked. His smokeless tobacco use includes chew. He reports current alcohol use. He reports that he does not use drugs.  Current Outpatient Medications on File Prior to Visit  Medication Sig Dispense Refill   acetaminophen  (TYLENOL ) 500 MG tablet Take 500 mg by mouth 3 (three) times daily as needed.     amiodarone  (PACERONE ) 200 MG tablet Take 200 mg by mouth daily.     apixaban  (ELIQUIS ) 5 MG TABS tablet Take 1 tablet (5 mg total) by mouth 2 (two) times daily. 2 BID X 7 days then 1 BID (Patient taking differently: Take 5 mg by mouth 2 (two) times daily.) 75 tablet 0   atorvastatin  (LIPITOR) 80 MG tablet Take 40 mg by mouth daily.     Carboxymethylcellulose Sodium 0.25 % SOLN Apply 1 drop to eye in the morning, at noon, in the evening, and at bedtime.     Continuous Blood Gluc Receiver (DEXCOM G7 RECEIVER) DEVI Use to record glucose readings QID and as needed 1 each PRN   Continuous Blood Gluc Sensor (FREESTYLE LIBRE 2 SENSOR) MISC USE AS DIRECTED, CHANGING EVERY 14 DAYS 6 each 3   cyanocobalamin 1000 MCG tablet Take 1,000 mcg by mouth daily.     empagliflozin (JARDIANCE) 25 MG TABS tablet Take 12.5 mg by mouth daily.     ferrous sulfate  325 (65 FE) MG tablet Take 325 mg by mouth daily with breakfast.      furosemide  (LASIX ) 40 MG tablet Take 1 tablet (40 mg total) by mouth daily.  90 tablet 1   HYDROcodone -acetaminophen  (NORCO) 7.5-325 MG tablet Take 1 tablet by mouth every 12 (twelve) hours as needed. 30 tablet 0   insulin  aspart (NOVOLOG  FLEXPEN) 100 UNIT/ML FlexPen Inject 20 Units into the skin in the morning and at bedtime.     levothyroxine (SYNTHROID) 137 MCG tablet Take 137 mcg  by mouth daily before breakfast.     loratadine (CLARITIN) 10 MG tablet Take 10 mg by mouth daily.     Magnesium  Oxide 420 MG TABS Take 420 mg by mouth in the morning and at bedtime.     melatonin 3 MG TABS tablet Take 12 mg by mouth at bedtime.     metFORMIN (GLUCOPHAGE) 1000 MG tablet Take 1,000 mg by mouth 2 (two) times daily with a meal.      Omega-3 Fatty Acids (FISH OIL) 1000 MG CAPS Take 1,000 mg by mouth daily.     omeprazole (PRILOSEC) 20 MG capsule Take 20 mg by mouth 2 (two) times daily.     Oxcarbazepine  (TRILEPTAL ) 300 MG tablet 300MG  AT 2PM AND 600MG  QHS for neuropathy of feet. 1 tablet 1   rasagiline (AZILECT) 0.5 MG TABS tablet Take 1 mg by mouth daily.     sacubitril-valsartan (ENTRESTO) 24-26 MG Take 0.5 tablets by mouth 2 (two) times daily.     spironolactone (ALDACTONE) 25 MG tablet Take 25 mg by mouth daily.     Vitamin D, Cholecalciferol , 10 MCG (400 UNIT) TABS Take 400 Units by mouth daily.     vitamin E 180 MG (400 UNITS) capsule Take 400 Units by mouth daily.     nitroGLYCERIN  (NITROSTAT ) 0.4 MG SL tablet Place 0.4 mg under the tongue every 5 (five) minutes as needed for chest pain.      No current facility-administered medications on file prior to visit.    ROS Review of Systems  Constitutional:  Negative for fever.  Respiratory:  Negative for shortness of breath.   Cardiovascular:  Negative for chest pain.  Musculoskeletal:  Negative for arthralgias.  Skin:  Negative for rash.    Objective:  BP (!) 101/58   Pulse 60   Temp 98 F (36.7 C)   Ht 5\' 10"  (1.778 m)   Wt 203 lb (92.1 kg)   SpO2 97%   BMI 29.13 kg/m   BP Readings from Last 3 Encounters:  10/22/23 (!) 101/58  10/17/23 (!) 96/54  04/23/23 (!) 96/54    Wt Readings from Last 3 Encounters:  10/22/23 203 lb (92.1 kg)  10/17/23 204 lb (92.5 kg)  04/23/23 204 lb 9.6 oz (92.8 kg)    Lab Results  Component Value Date   HGBA1C 6.7 (H) 04/23/2023   HGBA1C 7.1 (H) 10/18/2022   HGBA1C 7.6  (H) 07/20/2022    Physical Exam Vitals reviewed.  Constitutional:      Appearance: He is well-developed.  HENT:     Head: Normocephalic and atraumatic.     Right Ear: External ear normal.     Left Ear: External ear normal.     Mouth/Throat:     Pharynx: No oropharyngeal exudate or posterior oropharyngeal erythema.  Eyes:     Pupils: Pupils are equal, round, and reactive to light.  Cardiovascular:     Rate and Rhythm: Normal rate and regular rhythm.     Heart sounds: No murmur heard. Pulmonary:     Effort: No respiratory distress.     Breath sounds: Normal breath sounds.  Musculoskeletal:     Cervical  back: Normal range of motion and neck supple.     Comments: Unsteady gait   Neurological:     Mental Status: He is alert and oriented to person, place, and time.     Coordination: Coordination abnormal.         Assessment & Plan:  Type 2 diabetes mellitus with other circulatory complication, with long-term current use of insulin  (HCC) -     Bayer DCA Hb A1c Waived -     Microalbumin / creatinine urine ratio  Mixed hyperlipidemia -     CMP14+EGFR -     Lipid panel  Hypertension, unspecified type -     CBC with Differential/Platelet -     CMP14+EGFR  Elevated serum free T4 level -     TSH + free T4  Chronic renal failure (CRF), stage 3b (HCC) -     CMP14+EGFR  Other iron deficiency anemia -     CBC with Differential/Platelet  Unsteady gait  Recommended pt use a walker. He states he has one. Will not commit tousing it.   Follow-up: No follow-ups on file.  Roise Cleaver, M.D.

## 2023-10-23 LAB — CBC WITH DIFFERENTIAL/PLATELET
Basophils Absolute: 0.1 10*3/uL (ref 0.0–0.2)
Basos: 1 %
EOS (ABSOLUTE): 0.2 10*3/uL (ref 0.0–0.4)
Eos: 3 %
Hematocrit: 43.1 % (ref 37.5–51.0)
Hemoglobin: 14 g/dL (ref 13.0–17.7)
Immature Grans (Abs): 0 10*3/uL (ref 0.0–0.1)
Immature Granulocytes: 0 %
Lymphocytes Absolute: 1.9 10*3/uL (ref 0.7–3.1)
Lymphs: 23 %
MCH: 30.8 pg (ref 26.6–33.0)
MCHC: 32.5 g/dL (ref 31.5–35.7)
MCV: 95 fL (ref 79–97)
Monocytes Absolute: 0.7 10*3/uL (ref 0.1–0.9)
Monocytes: 8 %
Neutrophils Absolute: 5.5 10*3/uL (ref 1.4–7.0)
Neutrophils: 65 %
Platelets: 263 10*3/uL (ref 150–450)
RBC: 4.55 x10E6/uL (ref 4.14–5.80)
RDW: 12.6 % (ref 11.6–15.4)
WBC: 8.3 10*3/uL (ref 3.4–10.8)

## 2023-10-23 LAB — MICROALBUMIN / CREATININE URINE RATIO
Creatinine, Urine: 78.5 mg/dL
Microalb/Creat Ratio: 98 mg/g{creat} — ABNORMAL HIGH (ref 0–29)
Microalbumin, Urine: 76.9 ug/mL

## 2023-10-23 LAB — LIPID PANEL
Chol/HDL Ratio: 3.3 ratio (ref 0.0–5.0)
Cholesterol, Total: 164 mg/dL (ref 100–199)
HDL: 50 mg/dL (ref >39)
LDL Chol Calc (NIH): 87 mg/dL (ref 0–99)
Triglycerides: 158 mg/dL — ABNORMAL HIGH (ref 0–149)
VLDL Cholesterol Cal: 27 mg/dL (ref 5–40)

## 2023-10-23 LAB — CMP14+EGFR
ALT: 15 IU/L (ref 0–44)
AST: 19 IU/L (ref 0–40)
Albumin: 4.1 g/dL (ref 3.8–4.8)
Alkaline Phosphatase: 99 IU/L (ref 44–121)
BUN/Creatinine Ratio: 13 (ref 10–24)
BUN: 16 mg/dL (ref 8–27)
Bilirubin Total: 0.3 mg/dL (ref 0.0–1.2)
CO2: 21 mmol/L (ref 20–29)
Calcium: 9.2 mg/dL (ref 8.6–10.2)
Chloride: 102 mmol/L (ref 96–106)
Creatinine, Ser: 1.2 mg/dL (ref 0.76–1.27)
Globulin, Total: 2.4 g/dL (ref 1.5–4.5)
Glucose: 129 mg/dL — ABNORMAL HIGH (ref 70–99)
Potassium: 4.6 mmol/L (ref 3.5–5.2)
Sodium: 138 mmol/L (ref 134–144)
Total Protein: 6.5 g/dL (ref 6.0–8.5)
eGFR: 62 mL/min/{1.73_m2} (ref >59)

## 2023-10-23 LAB — TSH+FREE T4
Free T4: 1.16 ng/dL (ref 0.82–1.77)
TSH: 2.8 u[IU]/mL (ref 0.450–4.500)

## 2023-10-23 NOTE — Progress Notes (Signed)
[facility],  Your lab result is normal and/or stable.Some minor variations that are not significant are commonly marked abnormal, but do not represent any medical problem for you.  Best regards, Claretta Fraise, M.D.

## 2024-01-23 DIAGNOSIS — D0421 Carcinoma in situ of skin of right ear and external auricular canal: Secondary | ICD-10-CM | POA: Diagnosis not present

## 2024-01-23 DIAGNOSIS — D485 Neoplasm of uncertain behavior of skin: Secondary | ICD-10-CM | POA: Diagnosis not present

## 2024-01-23 DIAGNOSIS — L578 Other skin changes due to chronic exposure to nonionizing radiation: Secondary | ICD-10-CM | POA: Diagnosis not present

## 2024-01-23 DIAGNOSIS — L570 Actinic keratosis: Secondary | ICD-10-CM | POA: Diagnosis not present

## 2024-01-23 DIAGNOSIS — L821 Other seborrheic keratosis: Secondary | ICD-10-CM | POA: Diagnosis not present

## 2024-01-23 DIAGNOSIS — C44329 Squamous cell carcinoma of skin of other parts of face: Secondary | ICD-10-CM | POA: Diagnosis not present

## 2024-01-23 DIAGNOSIS — C44311 Basal cell carcinoma of skin of nose: Secondary | ICD-10-CM | POA: Diagnosis not present

## 2024-02-15 DIAGNOSIS — C44311 Basal cell carcinoma of skin of nose: Secondary | ICD-10-CM | POA: Diagnosis not present

## 2024-02-15 DIAGNOSIS — D0421 Carcinoma in situ of skin of right ear and external auricular canal: Secondary | ICD-10-CM | POA: Diagnosis not present

## 2024-02-15 DIAGNOSIS — C44222 Squamous cell carcinoma of skin of right ear and external auricular canal: Secondary | ICD-10-CM | POA: Diagnosis not present

## 2024-04-23 ENCOUNTER — Ambulatory Visit: Payer: Self-pay | Admitting: Family Medicine

## 2024-04-23 ENCOUNTER — Encounter: Payer: Self-pay | Admitting: Family Medicine
